# Patient Record
Sex: Female | Born: 1940 | Race: White | Hispanic: No | Marital: Married | Smoking: Current every day smoker
Health system: Southern US, Community
[De-identification: ages and names within clinical notes are randomized; demographics above are authoritative.]

## PROBLEM LIST (undated history)

## (undated) DIAGNOSIS — D049 Carcinoma in situ of skin, unspecified: Secondary | ICD-10-CM

## (undated) DIAGNOSIS — N281 Cyst of kidney, acquired: Secondary | ICD-10-CM

## (undated) DIAGNOSIS — T8859XA Other complications of anesthesia, initial encounter: Secondary | ICD-10-CM

## (undated) DIAGNOSIS — F0280 Dementia in other diseases classified elsewhere without behavioral disturbance: Secondary | ICD-10-CM

## (undated) DIAGNOSIS — I10 Essential (primary) hypertension: Secondary | ICD-10-CM

## (undated) DIAGNOSIS — I219 Acute myocardial infarction, unspecified: Secondary | ICD-10-CM

## (undated) DIAGNOSIS — I2510 Atherosclerotic heart disease of native coronary artery without angina pectoris: Secondary | ICD-10-CM

## (undated) DIAGNOSIS — N6009 Solitary cyst of unspecified breast: Secondary | ICD-10-CM

## (undated) DIAGNOSIS — I639 Cerebral infarction, unspecified: Secondary | ICD-10-CM

## (undated) DIAGNOSIS — M1990 Unspecified osteoarthritis, unspecified site: Secondary | ICD-10-CM

## (undated) DIAGNOSIS — I82409 Acute embolism and thrombosis of unspecified deep veins of unspecified lower extremity: Secondary | ICD-10-CM

## (undated) DIAGNOSIS — N390 Urinary tract infection, site not specified: Secondary | ICD-10-CM

## (undated) DIAGNOSIS — J45909 Unspecified asthma, uncomplicated: Secondary | ICD-10-CM

## (undated) DIAGNOSIS — K5792 Diverticulitis of intestine, part unspecified, without perforation or abscess without bleeding: Secondary | ICD-10-CM

## (undated) DIAGNOSIS — T4145XA Adverse effect of unspecified anesthetic, initial encounter: Secondary | ICD-10-CM

## (undated) DIAGNOSIS — G309 Alzheimer's disease, unspecified: Secondary | ICD-10-CM

## (undated) HISTORY — PX: TUBAL LIGATION: SHX77

## (undated) HISTORY — PX: EYE SURGERY: SHX253

## (undated) HISTORY — PX: BILATERAL KNEE ARTHROSCOPY: SUR91

---

## 1997-12-09 ENCOUNTER — Emergency Department (HOSPITAL_COMMUNITY): Admission: EM | Admit: 1997-12-09 | Discharge: 1997-12-09 | Payer: Self-pay | Admitting: *Deleted

## 1997-12-10 ENCOUNTER — Ambulatory Visit (HOSPITAL_COMMUNITY): Admission: RE | Admit: 1997-12-10 | Discharge: 1997-12-10 | Payer: Self-pay | Admitting: *Deleted

## 1998-01-03 ENCOUNTER — Emergency Department (HOSPITAL_COMMUNITY): Admission: EM | Admit: 1998-01-03 | Discharge: 1998-01-03 | Payer: Self-pay | Admitting: Emergency Medicine

## 1998-01-07 ENCOUNTER — Encounter: Admission: RE | Admit: 1998-01-07 | Discharge: 1998-04-07 | Payer: Self-pay | Admitting: *Deleted

## 1999-10-07 ENCOUNTER — Ambulatory Visit (HOSPITAL_COMMUNITY): Admission: RE | Admit: 1999-10-07 | Discharge: 1999-10-07 | Payer: Self-pay | Admitting: Internal Medicine

## 1999-10-07 ENCOUNTER — Encounter: Payer: Self-pay | Admitting: Internal Medicine

## 1999-11-04 ENCOUNTER — Emergency Department (HOSPITAL_COMMUNITY): Admission: EM | Admit: 1999-11-04 | Discharge: 1999-11-04 | Payer: Self-pay | Admitting: Emergency Medicine

## 1999-11-04 ENCOUNTER — Encounter: Payer: Self-pay | Admitting: Emergency Medicine

## 1999-12-26 ENCOUNTER — Emergency Department (HOSPITAL_COMMUNITY): Admission: EM | Admit: 1999-12-26 | Discharge: 1999-12-26 | Payer: Self-pay | Admitting: Emergency Medicine

## 2000-06-09 ENCOUNTER — Encounter: Payer: Self-pay | Admitting: Internal Medicine

## 2000-06-09 ENCOUNTER — Inpatient Hospital Stay (HOSPITAL_COMMUNITY): Admission: EM | Admit: 2000-06-09 | Discharge: 2000-06-10 | Payer: Self-pay

## 2002-03-07 ENCOUNTER — Encounter: Admission: RE | Admit: 2002-03-07 | Discharge: 2002-03-13 | Payer: Self-pay | Admitting: Occupational Medicine

## 2006-04-30 ENCOUNTER — Emergency Department (HOSPITAL_COMMUNITY): Admission: EM | Admit: 2006-04-30 | Discharge: 2006-05-01 | Payer: Self-pay | Admitting: Emergency Medicine

## 2007-03-03 ENCOUNTER — Emergency Department (HOSPITAL_COMMUNITY): Admission: EM | Admit: 2007-03-03 | Discharge: 2007-03-03 | Payer: Self-pay | Admitting: Emergency Medicine

## 2007-09-07 ENCOUNTER — Emergency Department (HOSPITAL_COMMUNITY): Admission: EM | Admit: 2007-09-07 | Discharge: 2007-09-07 | Payer: Self-pay | Admitting: Emergency Medicine

## 2007-11-10 ENCOUNTER — Emergency Department (HOSPITAL_COMMUNITY): Admission: EM | Admit: 2007-11-10 | Discharge: 2007-11-10 | Payer: Self-pay | Admitting: Emergency Medicine

## 2008-06-04 ENCOUNTER — Emergency Department (HOSPITAL_COMMUNITY): Admission: EM | Admit: 2008-06-04 | Discharge: 2008-06-04 | Payer: Self-pay | Admitting: Emergency Medicine

## 2009-03-12 ENCOUNTER — Emergency Department (HOSPITAL_COMMUNITY): Admission: EM | Admit: 2009-03-12 | Discharge: 2009-03-12 | Payer: Self-pay | Admitting: Emergency Medicine

## 2010-09-10 LAB — URINALYSIS, ROUTINE W REFLEX MICROSCOPIC
Ketones, ur: NEGATIVE mg/dL
Nitrite: NEGATIVE
Protein, ur: NEGATIVE mg/dL

## 2010-10-23 NOTE — Cardiovascular Report (Signed)
[place]. [facility]  Patient:    Mackenzie Richards, Mackenzie Richards                      MRN: [id number] Adm. Date:  [id number] Attending:  Pearla Dubonnet CC:         Pearla Dubonnet, M.D.  Cardiac catheterization laboratory   Cardiac Catheterization  PROCEDURE: 1. Left heart catheterization. 2. Coronary angiography. 3. Left ventriculogram. 4. Percutaneous closure of right femoral artery (Perclose).  INDICATIONS:  Mackenzie Richards is a 70 year old woman who has hyperlipidemia and is a tobacco abuser. She presented to Dr. Pearla Dubonnet office with prolonged anterior substernal chest discomfort, right, radiating through to the back, associated with some nausea and diaphoresis. The discomfort was relieved with a single sublingual nitroglycerin in the office. An ECG showed 0.5 to 1 mm ______ ST depression inferiorly. She is brought to the catheterization laboratory at this time to identify the extent of disease and provide for further therapeutic options.  PROCEDURAL NOTE:  Left heart catheterization was performed following the percutaneous insertion of 6-French catheter sheath utilizing an anterior approach over guiding J-wire into the right femoral artery. A 110-cm pigtail catheter was used to measure pressures in the ascending aorta and in the left ventricle, as well perform left ventriculography in a 30 degree RAO angulation. A 6-French #4 left and right Judkins catheters were used to perform coronary angiography. Cineangiography was performed in multiple LAO and RAO projections. A 0.1 mg intracoronary dose of nitroglycerin was given in the right coronary. This was followed by repeat angiography. At the completion, the catheter and catheter sheath were removed. Hemostasis was achieved by use of the Perclose system. There was good hemostasis. The patient was transported to the recovery area in stable condition with an intact  distal pulse.  HEMODYNAMICS: 1. Systemic arterial pressure was 124/63 with a mean of 87 mmHg. There was no    systolic gradient across the aortic valve. 2. Left ventricular end-diastolic pressure was 12 mmHg pre and post    ventriculogram.  ANGIOGRAPHY:  The left ventriculogram demonstrated normal left ventricular size and systolic function. There were no regional wall motion abnormalities. There was no calcification of the coronary arteries or the mitral or aortic valve noted. The calculated ejection fraction utilizing a single plane cine method was 61%. There was no mitral regurgitation.  There was a right dominant coronary system present. The main left coronary artery was normal.  The left anterior descending artery and its branches were normal, although diffusely narrowed.  The left circumflex artery and its branches were normal, although again diffusely narrowed.  The right coronary artery and its branches showed luminal irregularities, and there was a fusiform, concentric, 10 mm in length, 30 to 40% stenosis in the proximal segment. After administration of IC nitroglycerin, there was resolution of this narrowing, and the entire artery enlarged by at least 1 mm. There was a residual 30 to 40% stenosis at the junction of the mid and distal right coronary. The vessel did give rise to a small posterior descending artery, as well as moderate to small posterolateral segment and branches.  Collateral vessels were not seen.  FINAL IMPRESSION: 1. Nonobstructive atherosclerotic coronary vascular disease. 2. Suspect coronary vasospasm. 3. Intact left ventricular size and systolic function.  PLAN:  Will continue the patients current regimen of Lipitor and aspirin. Will add topical nitroglycerin 0.3 mg per hour, on in the morning, off at night. Advise strongly to discontinue  smoking as this is undoubtedly directly related to her clinical and angiographic findings. DD:   06/10/00 TD:  06/10/00 Job: [id number]

## 2010-10-23 NOTE — Discharge Summary (Signed)
[place]. [facility]  Patient:    Mackenzie Richards, Mackenzie Richards                      MRN: [id number] Adm. Date:  [id number] Disc. Date: [id number] Attending:  Pearla Dubonnet CC:         Mackenzie Richards, M.D.   Discharge Summary  DISCHARGE DIAGNOSES: 1. 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 2. Tobacco abuse. 3. Hypercholesterolemia. 4. Asthma. 5. Estrogen replacement therapy, but patient stopped three months prior to    admission. 6. Osteopenia. 7. History of deep venous thrombosis x 2 with negative coagulation work-up in    1998 including negative [facility] hypercoagulation panel and negative    screen for factor 5 ______ deficiency.  DISCHARGE MEDICATIONS: 1. Lipitor 40 mg q.d. 2. Aspirin 325 mg b.i.d. 3. Niacin 1 g p.o. q.h.s. 4. Nitroglycerin patch 0.3 mg topically for 18 hours a day with a six hour    window.  CONSULTS:  Cardiology-Dr. Corliss Marcus.  PROCEDURE:  Cardiac catheterization performed June 10, 2000.  Patient had a normal ejection fraction calculated at 61%.  She had a right dominant coronary system.  Left main coronary artery was normal.  Left anterior descending artery and its branches were normal, although diffusely narrowed.  The left circumflex artery and its branches were normal, although again diffusely narrowed.  The right coronary artery branches showed luminal irregularity.  There was a fusiform concentric 10 mm in length, 30-40% stenosis in the proximal segment. After administration of intracoronary nitroglycerin there was resolution of the narrowing and entire artery enlarged about by at least 1 mm.  There was a residual 30-40% stenosis in the junction of the mid and distal right coronary artery.  The vessel did give rise to a small posterior descending artery as well as a moderate to small posterolateral segment and branches.  Collateral vessels were not  seen.  HOSPITAL COURSE:  [name] is a very pleasant 70 year old female with the above medical problems who was admitted after complaining of several days of palpitations and on the morning of admission developed substernal chest pain and pressure at approximately 5 a.m.  This awakened her from her sleep.  She also felt short of breath and nauseous and the pain radiated to her back.  She presented to [facility] at [facility].  EKG revealed ST segment depression 1 mm in leads 2, 3, and aVF.  She was admitted to [facility] where coronary artery myocardial injury was ruled out with normal CPKs, CK-MBs, and troponin levels.  She was treated with intravenous nitroglycerin and IV heparin.  The patient then underwent cardiac catheterization as above.  Recommendations for therapy were to start nitroglycerin patch and to stop smoking.  Patient was also discharged home on Xanax 0.25 mg up to t.i.d. to treat any anxiety from nicotine withdrawal.  Plan to see her back in the office in one to two weeks.  She was discharged on June 10, 2000 with no chest pain and feeling well. DD:  06/13/00 TD:  06/13/00 Job: [id number]

## 2010-10-23 NOTE — Consult Note (Signed)
[place]. [facility]  Patient:    Mackenzie Richards, Mackenzie Richards                     MRN: [id number] Proc. Date: 06/09/00 Attending:  Francisca December, M.D. CC:         Pearla Dubonnet, M.D.   Consultation Report  REASON FOR CONSULTATION:  Chest pain and palpitations.  IMPRESSIONS: 1. New onset angina (unstable). 2. Hyperlipidemia. 3. Tobacco abuse. 4. Reversible obstructive airways disease. 5. Osteopenia. 6. Remote history of deep venous thrombosis following knee surgery.  RECOMMENDATIONS: 1. Agree with your planned thus far including intravenous heparin, IV    nitroglycerin, and p.o. aspirin. 2. Agree with rule out of myocardial infarction by serial CK-MB and    troponin enzymes, and repeat ECG. 3. Plan for diagnostic cardiac catheterization and possible percutaneous    coronary intervention a.m. of June 10, 2000.  Goals and risks were    reviewed with the patient.  She states her understanding, wishes to    proceed.  FINDINGS:  Mackenzie Richards is a 70 year old woman who, three days prior to admission, experienced the onset of intermittent palpitation.  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  They were not associated with significant shortness of breath or chest discomfort at this time.  She rested for the following two days and continued to have intermittent palpitation.  This morning at around 5 a.m., she awoke with substernal chest pressure, tightness, and pain radiating to the mid scapular region.  This was associated with some nausea and some shortness of breath.  She had no radiation to the arms.  She also had an increase in the frequency of her palpitations in a trigeminal or quadrigeminal fashion.  She sought care with Dr. Kevan Ny in the office who obtained an ECG revealing 1 mm of ST segment depression in the inferior leads. He administered sublingual nitroglycerin, and the  chest discomfort resolved. She was transferred to the emergency room, has remained pain free currently on IV nitroglycerin.  She denies any previous cardiac history, did not have hypertension, but is a tobacco abuser and has elevated cholesterol on Lipitor.  She has no strong family history of early CAD.  She is not diabetic.  PAST MEDICAL HISTORY:  As above.  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  She smokes about 1 pack of cigarettes per day, does not drink any alcohol.  Caffeine intake is not excessive.  FAMILY HISTORY:  Positive for frequent DVT and PE, especially in her mother; hypertension; late coronary disease; and Alzheimers disease.  MEDICATIONS: 1. Lipitor 40 mg p.o. q.d. 2. Aspirin 325 mg p.o. q.d. 3. Niacin 1 g per day.  DRUG ALLERGIES:  BIAXIN causes tachycardia.  PENICILLIN causes a rash.  SULFA causes nausea.  TEQUIN causes nausea and vomiting.  REVIEW OF SYSTEMS:  Negative except as mentioned above.  PHYSICAL EXAMINATION:  VITAL SIGNS:  Blood pressure 124/76, pulse 84, respirations 16, temperature afebrile.  GENERAL:  A well-appearing, 70 year old woman in no acute distress.  HEENT:  Remarkable for atraumatic, normocephalic cranium.  The pupils are equal, round and reactive to light and accommodation.  Extraocular movements are intact.  Oral mucosa is pink and moist, teeth and gums in good repair.  NECK:  Supple without thyromegaly or masses.  Carotid upstrokes are normal. There is no  bruit; there is no jugular venous distension.  CHEST:  Clear with good excursion bilaterally.  Normal vesicular breath sounds are heard throughout.  The precordium is quiet.  Normal S1 and S2.  No S3, S4, murmur, click, or rub noted.  PMI is not palpable.  ABDOMEN:  Soft, flat, nontender.  No hepatosplenomegaly or midline pulsatile masses.  EXTREMITIES:  Lower extremities no edema.  Distal pulses are  intact.  NEUROLOGIC:  Cranial nerves II-XII intact.  Motor and sensory grossly intact. Gait not tested.  SKIN:  Warm, dry, and clear.  LABORATORY DATA:  ECG in emergency room: Normal sinus rhythm, T wave inversions V2, V3.  No ST segment shift.  In the office at 1353, there was 1.5 to 1 mm ST segment depression in II, III, and aVF.  T waves in V2 and V3 were upright.  COMMENTS:  Mrs. Colaizzi presents with relatively classic unstable angina pectoris, and her symptoms, although prolonged, were relieved with a single nitroglycerin.  It is not out of the question that a non-ST segment elevation MI has occurred.  This will be further evaluated with serial enzymes. Ultimately, will require cardiac catheterization for diagnosis and likely therapy.  Thank you very much for allowing me to assist in the care of [facility]. It has been a pleasure to do so.  I will discuss her further care with you. DD:  06/09/00 TD:  06/09/00 Job: [id number]

## 2010-10-23 NOTE — H&P (Signed)
[place]. [facility]  Patient:    Mackenzie Richards, Mackenzie Richards                      MRN: [id number] Adm. Date:  [id number] Attending:  Pearla Dubonnet CC:         Francisca December, M.D.   History and Physical  CHIEF COMPLAINT: Chest pressure/pain.  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. [employer], with a history of the following:  1. Tobacco abuse.  2. Hypercholesterolemia.  3. Asthma.  4. Hormone replacement therapy, stopped three months ago.  5. Osteopenia.  6. History of deep vein thrombosis x 2, with negative coagulation work-up     in 1998.  CURRENT MEDICATIONS:  1. Lipitor 40 mg q.d.  2. Aspirin 325 mg b.i.d.  3. Niacin 1 g p.o. q.h.s.  She presents with complaints of several days of palpitations and on the morning of admission developed substernal chest pain and pressure at approximately 5 a.m., which awakened her from sleep.  She felt short of breath and nauseous, and the pain radiated to her mid back.  She presented to our office today at [facility] at [facility] with substernal pressure only, and a sublingual nitroglycerin relieved the pressure in the office.  Her EKG revealed sinus rhythm with ST segment depression 1 mm in 2, 3, and aVF. She is admitted now for probable unstable angina and question of subendocardial myocardial infarction.  ALLERGIES:  1. BIAXIN causes tachycardia.  2. PENICILLIN causes rash.  3. SULFA causes nausea.  4. TEQUIN has caused nausea and vomiting.  FAMILY HISTORY: Significant for deep vein thrombosis in her mother, with pulmonary embolism.  There is also a history of hypertension, coronary artery disease, and Alzheimers disease.  SOCIAL HISTORY: The patient has a 30-40 pack year smoking history and she currently smokes.  She does not drink alcohol.  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  She is married, with a  supportive husband.  REVIEW OF SYSTEMS: As per HPI.  No fever or chills.  No vomiting, no diarrhea, no abdominal pain.  No dysuria.  PHYSICAL EXAMINATION:  GENERAL: The patient is a well-developed, well-nourished female complaining of mild chest pressure.  This was relieved with sublingual nitroglycerin.  VITAL SIGNS: Blood pressure 124/70, pulse 80 and regular - no evidence of ectopy.  Respiratory rate 16 and easy.  Temperature 98.2 degrees.  Pulse oximetry 96% on room air.  HEENT: Head normocephalic, atraumatic.  Normal examination.  NECK: Without JVD.  No bruits noted bilaterally.  CHEST: Clear to auscultation.  CARDIAC: Regular rate and rhythm without murmurs, rubs, or gallops.  ABDOMEN: Soft, nontender.  EXTREMITIES: Without edema.  NEUROLOGIC: The patient is oriented x 3.  Nonfocal examination.  LABORATORY DATA: EKG reveals sinus rhythm at 80 beats per minute and ST segment depression in 2, 3, and aVF; no Q waves are present; no ectopy; no PVCs or PACs.  ASSESSMENT: The patient is a 70 year old female who is an active smoker and has hypercholesterolemia, with a family history of coronary artery disease as well as pulmonary embolus and deep vein thrombosis.  She presents with symptoms consistent with unstable angina.  PLAN:  1. Cardiac service consultation - discuss with Dr. Corliss Marcus.  2. Treat with IV heparin and IV nitroglycerin.  3. Continue aspirin therapy.  4. Nasal cannula oxygen.  5. Continue Lipitor.  6. Check serial cardiac enzymes.  7. Place on cardiac monitoring.  The patient is agreeable to admission and understands her plan of care. DD:  06/10/00 TD:  06/10/00 Job: [id number]

## 2011-03-02 LAB — POCT I-STAT, CHEM 8
Calcium, Ion: 1.16
Creatinine, Ser: 1.1
Hemoglobin: 11.9 — ABNORMAL LOW
Sodium: 143
TCO2: 23

## 2011-03-02 LAB — POCT CARDIAC MARKERS
Myoglobin, poc: 33.5
Myoglobin, poc: 39.3
Operator id: 146091
Operator id: 288331
Troponin i, poc: <0.05

## 2011-03-02 LAB — D-DIMER, QUANTITATIVE: D-Dimer, Quant: 0.46

## 2011-03-04 LAB — POCT I-STAT, CHEM 8
BUN: 14
Creatinine, Ser: 1.3 — ABNORMAL HIGH
Glucose, Bld: 106 — ABNORMAL HIGH
Hemoglobin: 13.9
Potassium: 3.7

## 2011-03-12 LAB — URINE MICROSCOPIC-ADD ON

## 2011-03-12 LAB — DIFFERENTIAL
Basophils Absolute: 0.1 10*3/uL (ref 0.0–0.1)
Basophils Relative: 1 % (ref 0–1)
Eosinophils Absolute: 0.2 10*3/uL (ref 0.0–0.7)
Monocytes Relative: 7 % (ref 3–12)
Neutro Abs: 4.4 10*3/uL (ref 1.7–7.7)
Neutrophils Relative %: 57 % (ref 43–77)

## 2011-03-12 LAB — COMPREHENSIVE METABOLIC PANEL
ALT: 25 U/L (ref 0–35)
Alkaline Phosphatase: 83 U/L (ref 39–117)
BUN: 7 mg/dL (ref 6–23)
CO2: 26 mEq/L (ref 19–32)
Chloride: 105 mEq/L (ref 96–112)
GFR calc non Af Amer: 60 mL/min — ABNORMAL LOW (ref >60)
Glucose, Bld: 99 mg/dL (ref 70–99)
Potassium: 3.9 mEq/L (ref 3.5–5.1)
Sodium: 141 mEq/L (ref 135–145)
Total Bilirubin: 1.2 mg/dL (ref 0.3–1.2)
Total Protein: 7.1 g/dL (ref 6.0–8.3)

## 2011-03-12 LAB — URINALYSIS, ROUTINE W REFLEX MICROSCOPIC
Bilirubin Urine: NEGATIVE
Glucose, UA: NEGATIVE mg/dL
Specific Gravity, Urine: 1.005 (ref 1.005–1.030)
Urobilinogen, UA: 0.2 mg/dL (ref 0.0–1.0)

## 2011-03-12 LAB — CBC
HCT: 41 % (ref 36.0–46.0)
Hemoglobin: 13.7 g/dL (ref 12.0–15.0)
RBC: 3.91 MIL/uL (ref 3.87–5.11)
RDW: 13.9 % (ref 11.5–15.5)
WBC: 7.8 10*3/uL (ref 4.0–10.5)

## 2012-04-04 ENCOUNTER — Encounter (HOSPITAL_COMMUNITY): Payer: Self-pay | Admitting: Emergency Medicine

## 2012-04-04 ENCOUNTER — Emergency Department (INDEPENDENT_AMBULATORY_CARE_PROVIDER_SITE_OTHER)
Admission: EM | Admit: 2012-04-04 | Discharge: 2012-04-04 | Disposition: A | Discharge status: Home / Self Care | Payer: Medicare Other | Source: Home / Self Care | Attending: Emergency Medicine | Admitting: Emergency Medicine

## 2012-04-04 DIAGNOSIS — N390 Urinary tract infection, site not specified: Secondary | ICD-10-CM

## 2012-04-04 HISTORY — DX: Urinary tract infection, site not specified: N39.0

## 2012-04-04 HISTORY — DX: Diverticulitis of intestine, part unspecified, without perforation or abscess without bleeding: K57.92

## 2012-04-04 HISTORY — DX: Solitary cyst of unspecified breast: N60.09

## 2012-04-04 HISTORY — DX: Unspecified asthma, uncomplicated: J45.909

## 2012-04-04 HISTORY — DX: Acute embolism and thrombosis of unspecified deep veins of unspecified lower extremity: I82.409

## 2012-04-04 HISTORY — DX: Carcinoma in situ of skin, unspecified: D04.9

## 2012-04-04 HISTORY — DX: Cyst of kidney, acquired: N28.1

## 2012-04-04 LAB — POCT URINALYSIS DIP (DEVICE)
Bilirubin Urine: NEGATIVE
Glucose, UA: 100 mg/dL — AB
Nitrite: POSITIVE — AB
Urobilinogen, UA: 1 mg/dL (ref 0.0–1.0)
pH: 6.5 (ref 5.0–8.0)

## 2012-04-04 MED ORDER — ONDANSETRON HCL 4 MG PO TABS: 4.0000 mg | ORAL_TABLET | Freq: Three times a day (TID) | ORAL | Status: DC | PRN

## 2012-04-04 MED ORDER — NITROFURANTOIN MONOHYD MACRO 100 MG PO CAPS: 100.0000 mg | ORAL_CAPSULE | Freq: Two times a day (BID) | ORAL | Status: DC

## 2012-04-04 NOTE — ED Provider Notes (Signed)
History     CSN: [id number]  Arrival date & time 04/04/12  1447   First MD Initiated Contact with Patient 04/04/12 1450      Chief Complaint  Patient presents with  . Urinary Tract Infection    (Consider location/radiation/quality/duration/timing/severity/associated sxs/prior treatment) HPI Comments: This is a 71 year old well-preserved female with a long history of frequent UTIs presents with UTI symptoms. She describes burning and stinging with urination, dysuria and pelvic pressure. She says she has pain that radiates from the pelvis to her bilateral flanks for 48 hours. This morning she awoke and she's vomited 3 times. For treatment she is taking increased amounts of water taking AZO and drinking cranberry juice.she also took 1 capsule of an old tetracycline. She denies fever chills shortness of breath chest pain or upper respiratory infection symptoms. Her most recent UTI was a string of Escherichia coli resistant to me, and antibiotics including Cipro. She is taking Cipro at that time and was not improving to have to be admitted to the hospital. The urine culture at that time showed that nitrofurantoin was sensitive to this particular strain.   Past Medical History  Diagnosis Date  . Asthma   . Frequent UTI   . Kidney cysts   . Breast cyst   . Bowen's disease   . DVT (deep venous thrombosis)   . Diverticulitis     Past Surgical History  Procedure Date  . Bilateral knee arthroscopy   . Eye surgery   . Tubal ligation     No family history on file.  History  Substance Use Topics  . Smoking status: Current Every Day Smoker -- 1.0 packs/day    Types: Cigarettes  . Smokeless tobacco: Not on file  . Alcohol Use: No    OB History    Grav Para Term Preterm Abortions TAB SAB Ect Mult Living                  Review of Systems  Constitutional: Negative for fever, chills and activity change.  HENT: Negative.   Respiratory: Negative.   Cardiovascular: Negative.     Genitourinary: Positive for dysuria, urgency, frequency, flank pain, difficulty urinating and pelvic pain. Negative for vaginal discharge and vaginal pain.  Skin: Negative for color change, pallor and rash.  Neurological: Negative.     Allergies  Biaxin; Levaquin; Penicillins; Percocet; Sulfa antibiotics; and Zyban  Home Medications   Current Outpatient Rx  Name Route Sig Dispense Refill  . ASPIRIN 325 MG PO TABS Oral Take 325 mg by mouth daily.    . ENALAPRIL MALEATE PO Oral Take by mouth.    Marland Kitchen SIMVASTATIN 80 MG PO TABS Oral Take 80 mg by mouth at bedtime.    . TETRACYCLINE HCL PO Oral Take by mouth.    . ZOLPIDEM TARTRATE 5 MG PO TABS Oral Take 5 mg by mouth at bedtime as needed.    Marland Kitchen NITROFURANTOIN MONOHYD MACRO 100 MG PO CAPS Oral Take 1 capsule (100 mg total) by mouth 2 (two) times daily. 20 capsule 0  . ONDANSETRON HCL 4 MG PO TABS Oral Take 1 tablet (4 mg total) by mouth every 8 (eight) hours as needed for nausea. May cause constipation 20 tablet 0    BP 167/79  Pulse 70  Temp 98.9 F (37.2 C) (Oral)  Resp 18  SpO2 95%  Physical Exam  Constitutional: She is oriented to person, place, and time. She appears well-developed and well-nourished. No distress.  HENT:  Head:  Normocephalic and atraumatic.  Eyes: EOM are normal.  Neck: Normal range of motion. Neck supple.  Cardiovascular: Normal rate and normal heart sounds.   Pulmonary/Chest: Effort normal and breath sounds normal. No respiratory distress.  Abdominal: Soft. She exhibits no mass. There is tenderness. There is no rebound and no guarding.  Musculoskeletal: Normal range of motion. She exhibits no edema.  Neurological: She is alert and oriented to person, place, and time. No cranial nerve deficit.  Skin: Skin is warm and dry. No rash noted.  Psychiatric: She has a normal mood and affect.    ED Course  Procedures (including critical care time)  Labs Reviewed  POCT URINALYSIS DIP (DEVICE) - Abnormal; Notable  for the following:    Glucose, UA 100 (*)     Hgb urine dipstick MODERATE (*)     Nitrite POSITIVE (*)     Leukocytes, UA SMALL (*)  Biochemical Testing Only. Please order routine urinalysis from main lab if confirmatory testing is needed.   All other components within normal limits  URINE CULTURE   No results found.   1. UTI (urinary tract infection), uncomplicated       MDM  Since her past cultures have produced results that showed nitrofurantoin sensitivity will prescribe Macrobid twice a day for 10 days. Nausea Zofran 4 mg every 6-8 hours when necessary Plenty of fluids in small frequent amounts and continued cranberry juice. Suggest you call [facility] urology that she would have established with and continue the workup. For development of fever increased back pain vomiting or inability to maintain the antibiotics. May need to go to the emergency department.        Hayden Rasmussen, NP 04/04/12 1538  Hayden Rasmussen, NP 04/04/12 1538

## 2012-04-04 NOTE — ED Notes (Signed)
Pt c/o UTI x3 days... Hx of frequent UTI's.... Sx include: burning/stinging when she urinates, pelvic pressure and back pain, nauseas, vomiting.... Denies: fevers, diarrhea... Pt is alert w/no signs of distress.

## 2012-04-05 LAB — URINE CULTURE

## 2012-04-06 NOTE — ED Provider Notes (Signed)
Medical screening examination/treatment/procedure(s) were performed by non-physician practitioner and as supervising physician I was immediately available for consultation/collaboration.  Leslee Home, M.D.   Reuben Likes, MD 04/06/12 [phone]

## 2012-08-11 ENCOUNTER — Encounter (HOSPITAL_COMMUNITY): Payer: Self-pay

## 2012-08-11 ENCOUNTER — Emergency Department (HOSPITAL_COMMUNITY)
Admission: EM | Admit: 2012-08-11 | Discharge: 2012-08-11 | Disposition: A | Discharge status: Home / Self Care | Payer: Medicare Other | Source: Home / Self Care

## 2012-08-11 ENCOUNTER — Emergency Department (INDEPENDENT_AMBULATORY_CARE_PROVIDER_SITE_OTHER): Payer: Medicare Other

## 2012-08-11 DIAGNOSIS — S92514A Nondisplaced fracture of proximal phalanx of right lesser toe(s), initial encounter for closed fracture: Secondary | ICD-10-CM

## 2012-08-11 DIAGNOSIS — S92919A Unspecified fracture of unspecified toe(s), initial encounter for closed fracture: Secondary | ICD-10-CM

## 2012-08-11 DIAGNOSIS — N390 Urinary tract infection, site not specified: Secondary | ICD-10-CM

## 2012-08-11 MED ORDER — HYDROCODONE-ACETAMINOPHEN 5-325 MG PO TABS: 1.0000 | ORAL_TABLET | Freq: Four times a day (QID) | ORAL | Status: DC | PRN

## 2012-08-11 NOTE — ED Provider Notes (Signed)
Medical screening examination/treatment/procedure(s) were performed by non-physician practitioner and as supervising physician I was immediately available for consultation/collaboration.  Leslee Home, M.D.  Reuben Likes, MD 08/11/12 2015

## 2012-08-11 NOTE — ED Notes (Addendum)
Fell on steps at her home going into her basment ; c/o pain in right hip and right foot/ankle; denies other injury , denies LOC

## 2012-08-11 NOTE — ED Provider Notes (Signed)
History     CSN: [id number]  Arrival date & time 08/11/12  1734   None     Chief Complaint  Patient presents with  . Fall    (Consider location/radiation/quality/duration/timing/severity/associated sxs/prior treatment) HPI Comments: Power out in pt's home, she attempted to walk downstairs in the dark, R foot rolled on 2nd to bottom step and she fell forward landing on R side. Pt reports R shoulder and wrist and hip are "sore" but not a concern to her, but R foot and lower leg are in "pain" and she is worried they are broken.   Patient is a 72 y.o. female presenting with fall. The history is provided by the patient.  Fall The accident occurred 3 to 5 hours ago. Fall occurred: walking down stairs. She fell from a height of 1 to 2 ft. She landed on a hard floor. There was no blood loss. The point of impact was the right hip (R hip, R foot and lower leg). Pain location: R foot and lower leg. The pain is at a severity of 6/10. The pain is moderate. She was ambulatory at the scene. Pertinent negatives include no numbness, no nausea, no headaches, no loss of consciousness and no tingling. The symptoms are aggravated by activity and use of the injured limb. She has tried ice for the symptoms. The treatment provided no relief.    Past Medical History  Diagnosis Date  . Asthma   . Frequent UTI   . Kidney cysts   . Breast cyst   . Bowen's disease   . DVT (deep venous thrombosis)   . Diverticulitis     Past Surgical History  Procedure Laterality Date  . Bilateral knee arthroscopy    . Eye surgery    . Tubal ligation      History reviewed. No pertinent family history.  History  Substance Use Topics  . Smoking status: Current Every Day Smoker -- 1.00 packs/day    Types: Cigarettes  . Smokeless tobacco: Not on file  . Alcohol Use: No    OB History   Grav Para Term Preterm Abortions TAB SAB Ect Mult Living                  Review of Systems  Gastrointestinal: Negative for  nausea.  Musculoskeletal:       R foot and lower leg pain; R shoulder, hip and wrist sore  Skin:       Bruising R foot  Neurological: Negative for dizziness, tingling, loss of consciousness, weakness, numbness and headaches.    Allergies  Biaxin; Fentanyl; Hydrochlorothiazide; Lasix; Levaquin; Penicillins; Percocet; Sulfa antibiotics; and Zyban  Home Medications   Current Outpatient Rx  Name  Route  Sig  Dispense  Refill  . aspirin 325 MG tablet   Oral   Take 325 mg by mouth daily.         . ENALAPRIL MALEATE PO   Oral   Take by mouth.         . simvastatin (ZOCOR) 80 MG tablet   Oral   Take 80 mg by mouth at bedtime.         Marland Kitchen spironolactone (ALDACTONE) 25 MG tablet   Oral   Take 25 mg by mouth daily.         Marland Kitchen zolpidem (AMBIEN) 5 MG tablet   Oral   Take 5 mg by mouth at bedtime as needed.         Marland Kitchen HYDROcodone-acetaminophen (NORCO/VICODIN) 5-325  MG per tablet   Oral   Take 1 tablet by mouth every 6 (six) hours as needed for pain.   16 tablet   0   . nitrofurantoin, macrocrystal-monohydrate, (MACROBID) 100 MG capsule   Oral   Take 1 capsule (100 mg total) by mouth 2 (two) times daily.   20 capsule   0   . ondansetron (ZOFRAN) 4 MG tablet   Oral   Take 1 tablet (4 mg total) by mouth every 8 (eight) hours as needed for nausea. May cause constipation   20 tablet   0   . TETRACYCLINE HCL PO   Oral   Take by mouth.           BP 162/80  Pulse 59  Temp(Src) 97.7 F (36.5 C) (Oral)  Resp 16  SpO2 98%  Physical Exam  Constitutional: She appears well-developed and well-nourished. No distress.  Musculoskeletal:       Right shoulder: She exhibits tenderness. She exhibits normal range of motion, no bony tenderness, no swelling and no deformity.       Right wrist: Normal.       Right hip: She exhibits tenderness. She exhibits no bony tenderness, no swelling and no deformity.  Pt refuses full ROM of R hip stating "my hip is fine".   Skin: Skin is  warm, dry and intact. Bruising noted.  Bruising just proximal to R 2nd, 3rd and 4th toes.     ED Course  Procedures (including critical care time)  Labs Reviewed - No data to display Dg Tibia/fibula Right  08/11/2012  *RADIOLOGY REPORT*  Clinical Data: Fall, bruising at the third MTP joint and mid lower leg  RIGHT TIBIA AND FIBULA - 2 VIEW  Comparison: Concurrently obtained radiographs of the foot  Findings: No acute fracture, malalignment or focal soft tissue swelling.  Bony mineralization is within normal limits.  Mild degenerative changes incompletely imaged in the knee.  Visualized portions of the foot are unremarkable.  IMPRESSION: No acute osseous abnormality.   Original Report Authenticated By: Malachy Moan, M.D.    Dg Foot Complete Right  08/11/2012  *RADIOLOGY REPORT*  Clinical Data: Fall with bruising at the third metatarsal phalangeal joint.  RIGHT FOOT COMPLETE - 3+ VIEW  Comparison: None.  Findings: Transverse, nondisplaced fracture of the proximal aspect of the proximal phalanx of the third digit.  No intra-articular extension.  Not well visualized on the lateral view.  IMPRESSION: Third proximal phalanx fracture.   Original Report Authenticated By: Jeronimo Greaves, M.D.      1. Closed nondisp fx of proximal phalanx of lesser toe of right foot, initial encounter       MDM          Cathlyn Parsons, NP 08/11/12 1929

## 2013-11-04 ENCOUNTER — Encounter (HOSPITAL_COMMUNITY): Payer: Self-pay | Admitting: Emergency Medicine

## 2013-11-04 ENCOUNTER — Emergency Department (INDEPENDENT_AMBULATORY_CARE_PROVIDER_SITE_OTHER)
Admission: EM | Admit: 2013-11-04 | Discharge: 2013-11-04 | Disposition: A | Discharge status: Home / Self Care | Payer: Medicare Other | Source: Home / Self Care | Attending: Emergency Medicine | Admitting: Emergency Medicine

## 2013-11-04 DIAGNOSIS — J209 Acute bronchitis, unspecified: Secondary | ICD-10-CM

## 2013-11-04 MED ORDER — METHYLPREDNISOLONE ACETATE 80 MG/ML IJ SUSP
80.0000 mg | Freq: Once | INTRAMUSCULAR | Status: AC
  Administered 2013-11-04: 80 mg via INTRAMUSCULAR

## 2013-11-04 MED ORDER — PREDNISONE 20 MG PO TABS: ORAL_TABLET | ORAL | Status: DC

## 2013-11-04 MED ORDER — DOXYCYCLINE HYCLATE 100 MG PO TABS: 100.0000 mg | ORAL_TABLET | Freq: Two times a day (BID) | ORAL | Status: DC

## 2013-11-04 MED ORDER — METHYLPREDNISOLONE ACETATE 80 MG/ML IJ SUSP
INTRAMUSCULAR | Status: AC
  Filled 2013-11-04: qty 1

## 2013-11-04 MED ORDER — HYDROCOD POLST-CHLORPHEN POLST 10-8 MG/5ML PO LQCR
5.0000 mL | Freq: Two times a day (BID) | ORAL | Status: DC | PRN
Start: 2013-11-04 — End: 2016-11-17

## 2013-11-04 NOTE — ED Notes (Signed)
4 days of sore throat, nasal congestion and productive cough without fever  She has been using her nebulizer at home for wheezing

## 2013-11-04 NOTE — Discharge Instructions (Signed)
Acute Bronchitis Bronchitis is inflammation of the airways that extend from the windpipe into the lungs (bronchi). The inflammation often causes mucus to develop. This leads to a cough, which is the most common symptom of bronchitis.  In acute bronchitis, the condition usually develops suddenly and goes away over time, usually in a couple weeks. Smoking, allergies, and asthma can make bronchitis worse. Repeated episodes of bronchitis may cause further lung problems.  CAUSES Acute bronchitis is most often caused by the same virus that causes a cold. The virus can spread from person to person (contagious).  SIGNS AND SYMPTOMS   Cough.   Fever.   Coughing up mucus.   Body aches.   Chest congestion.   Chills.   Shortness of breath.   Sore throat.  DIAGNOSIS  Acute bronchitis is usually diagnosed through a physical exam. Tests, such as chest X-rays, are sometimes done to rule out other conditions.  TREATMENT  Acute bronchitis usually goes away in a couple weeks. Often times, no medical treatment is necessary. Medicines are sometimes given for relief of fever or cough. Antibiotics are usually not needed but may be prescribed in certain situations. In some cases, an inhaler may be recommended to help reduce shortness of breath and control the cough. A cool mist vaporizer may also be used to help thin bronchial secretions and make it easier to clear the chest.  HOME CARE INSTRUCTIONS  Get plenty of rest.   Drink enough fluids to keep your urine clear or pale yellow (unless you have a medical condition that requires fluid restriction). Increasing fluids may help thin your secretions and will prevent dehydration.   Only take over-the-counter or prescription medicines as directed by your health care provider.   Avoid smoking and secondhand smoke. Exposure to cigarette smoke or irritating chemicals will make bronchitis worse. If you are a smoker, consider using nicotine gum or skin  patches to help control withdrawal symptoms. Quitting smoking will help your lungs heal faster.   Reduce the chances of another bout of acute bronchitis by washing your hands frequently, avoiding people with cold symptoms, and trying not to touch your hands to your mouth, nose, or eyes.   Follow up with your health care provider as directed.  SEEK MEDICAL CARE IF: Your symptoms do not improve after 1 week of treatment.  SEEK IMMEDIATE MEDICAL CARE IF:  You develop an increased fever or chills.   You have chest pain.   You have severe shortness of breath.  You have bloody sputum.   You develop dehydration.  You develop fainting.  You develop repeated vomiting.  You develop a severe headache. MAKE SURE YOU:   Understand these instructions.  Will watch your condition.  Will get help right away if you are not doing well or get worse. Document Released: 07/01/2004 Document Revised: 01/24/2013 Document Reviewed: 11/14/2012 [employer] Patient Information 2014 [employer].  

## 2013-11-04 NOTE — ED Provider Notes (Signed)
  Chief Complaint   Chief Complaint  Patient presents with  . Cough    History of Present Illness   Mackenzie Richards is a 73 year old female smoker with a one-week history of sore throat, cough productive yellow sputum, chest tightness, wheezing, a raw feeling in her chest, nasal congestion with yellow drainage, headache, and sinus pressure. She denies fever, chills, or GI symptoms. She's been exposed to a family member who had a similar illness.  Review of Systems   Other than as noted above, the patient denies any of the following symptoms: Systemic:  No fevers, chills, sweats, or myalgias. Eye:  No redness or discharge. ENT:  No ear pain, headache, nasal congestion, drainage, sinus pressure, or sore throat. Neck:  No neck pain, stiffness, or swollen glands. Lungs:  No cough, sputum production, hemoptysis, wheezing, chest tightness, shortness of breath or chest pain. GI:  No abdominal pain, nausea, vomiting or diarrhea.  PMFSH   Past medical history, family history, social history, meds, and allergies were reviewed. She has coronary artery disease with 2 stents. She's a smoker and has elevated cholesterol. Current meds include aspirin, enalapril, Zocor, and Aldactone.  Physical exam   Vital signs:  BP 155/75  Pulse 71  Temp(Src) 98.1 F (36.7 C) (Oral)  Resp 18  SpO2 97% General:  Alert and oriented.  In no distress.  Skin warm and dry. Eye:  No conjunctival injection or drainage. Lids were normal. ENT:  TMs and canals were normal, without erythema or inflammation.  Nasal mucosa was clear and uncongested, without drainage.  Mucous membranes were moist.  Pharynx was clear with no exudate or drainage.  There were no oral ulcerations or lesions. Neck:  Supple, no adenopathy, tenderness or mass. Lungs:  No respiratory distress.  She has bilateral scattered rhonchi and wheezes, no rales, good air movement bilaterally.  Heart:  Regular rhythm, without gallops, murmers or rubs. Skin:   Clear, warm, and dry, without rash or lesions.   Assessment     The encounter diagnosis was Acute bronchitis.  Probably acute on chronic bronchitis. She is still smoking and was strongly encouraged to quit.  Plan    1.  Meds:  The following meds were prescribed:   Discharge Medication List as of 11/04/2013 12:15 PM    START taking these medications   Details  chlorpheniramine-HYDROcodone (TUSSIONEX) 10-8 MG/5ML LQCR Take 5 mLs by mouth every 12 (twelve) hours as needed for cough., Starting 11/04/2013, Until Discontinued, Normal    doxycycline (VIBRA-TABS) 100 MG tablet Take 1 tablet (100 mg total) by mouth 2 (two) times daily., Starting 11/04/2013, Until Discontinued, Normal    predniSONE (DELTASONE) 20 MG tablet Take 3 daily for 5 days, 2 daily for 5 days, 1 daily for 5 days., Normal        2.  Patient Education/Counseling:  The patient was given appropriate handouts, self care instructions, and instructed in symptomatic relief.  Instructed to get extra fluids, rest, and use a cool mist vaporizer.    3.  Follow up:  The patient was told to follow up here if no better in 3 to 4 days, or sooner if becoming worse in any way, and given some red flag symptoms such as increasing fever, difficulty breathing, chest pain, or persistent vomiting which would prompt immediate return.  Follow up here as needed.      Reuben Likes, MD 11/04/13 1324

## 2013-11-11 ENCOUNTER — Emergency Department (INDEPENDENT_AMBULATORY_CARE_PROVIDER_SITE_OTHER): Payer: Medicare Other

## 2013-11-11 ENCOUNTER — Emergency Department (INDEPENDENT_AMBULATORY_CARE_PROVIDER_SITE_OTHER)
Admission: EM | Admit: 2013-11-11 | Discharge: 2013-11-11 | Disposition: A | Discharge status: Home / Self Care | Payer: Medicare Other | Source: Home / Self Care | Attending: Family Medicine | Admitting: Family Medicine

## 2013-11-11 ENCOUNTER — Encounter (HOSPITAL_COMMUNITY): Payer: Self-pay | Admitting: Emergency Medicine

## 2013-11-11 DIAGNOSIS — R059 Cough, unspecified: Secondary | ICD-10-CM

## 2013-11-11 DIAGNOSIS — J209 Acute bronchitis, unspecified: Secondary | ICD-10-CM

## 2013-11-11 DIAGNOSIS — R05 Cough: Secondary | ICD-10-CM

## 2013-11-11 DIAGNOSIS — J189 Pneumonia, unspecified organism: Secondary | ICD-10-CM

## 2013-11-11 MED ORDER — METHYLPREDNISOLONE ACETATE 80 MG/ML IJ SUSP
INTRAMUSCULAR | Status: AC
  Filled 2013-11-11: qty 1

## 2013-11-11 MED ORDER — METHYLPREDNISOLONE ACETATE 80 MG/ML IJ SUSP
80.0000 mg | Freq: Once | INTRAMUSCULAR | Status: AC
  Administered 2013-11-11: 80 mg via INTRAMUSCULAR

## 2013-11-11 MED ORDER — CEFDINIR 300 MG PO CAPS: 300.0000 mg | ORAL_CAPSULE | Freq: Two times a day (BID) | ORAL | Status: AC

## 2013-11-11 NOTE — ED Provider Notes (Signed)
Medical screening examination/treatment/procedure(s) were performed by a resident physician or non-physician practitioner and as the supervising physician I was immediately available for consultation/collaboration.  Tayleigh Wetherell, MD    Illya Gienger S Kiira Brach, MD 11/11/13 1848 

## 2013-11-11 NOTE — ED Provider Notes (Signed)
CSN: [id number]     Arrival date & time 11/11/13  0901 History   First MD Initiated Contact with Patient 11/11/13 [phone]     Chief Complaint  Patient presents with  . Cough  . Re-evaluation   (Consider location/radiation/quality/duration/timing/severity/associated sxs/prior Treatment) HPI Comments: [name] presents today with a continued productive cough. She carries a history of long time smoker and COPD as well. She was evaluated on 05/31 and was diagnosed with bronchitis. Treated with pulse steroids and doxy. She was better for a few days, then the cough has now worsened with continued yellow productive sputum. She continues to have yellow drainage from left eye. No fever or chills. Mild malaise.   Patient is a 73 y.o. female presenting with cough. The history is provided by the patient.  Cough Associated symptoms: rhinorrhea and wheezing   Associated symptoms: no chills, no fever and no shortness of breath     Past Medical History  Diagnosis Date  . Asthma   . Frequent UTI   . Kidney cysts   . Breast cyst   . Bowen's disease   . DVT (deep venous thrombosis)   . Diverticulitis    Past Surgical History  Procedure Laterality Date  . Bilateral knee arthroscopy    . Eye surgery    . Tubal ligation     No family history on file. History  Substance Use Topics  . Smoking status: Current Every Day Smoker -- 1.00 packs/day    Types: Cigarettes  . Smokeless tobacco: Not on file  . Alcohol Use: No   OB History   Grav Para Term Preterm Abortions TAB SAB Ect Mult Living                 Review of Systems  Constitutional: Positive for fatigue. Negative for fever and chills.  HENT: Positive for postnasal drip, rhinorrhea and sinus pressure.   Respiratory: Positive for cough and wheezing. Negative for chest tightness and shortness of breath.   Endocrine: Negative.   Genitourinary: Negative.   Skin: Negative.   Neurological: Negative.   Psychiatric/Behavioral: Negative.      Allergies  Biaxin; Fentanyl; Hydrochlorothiazide; Lasix; Levaquin; Penicillins; Percocet; Sulfa antibiotics; and Zyban  Home Medications   Prior to Admission medications   Medication Sig Start Date End Date Taking? Authorizing Provider  aspirin 325 MG tablet Take 325 mg by mouth daily.   Yes Historical Provider, MD  chlorpheniramine-HYDROcodone (TUSSIONEX) 10-8 MG/5ML LQCR Take 5 mLs by mouth every 12 (twelve) hours as needed for cough. 11/04/13  Yes Reuben Likesavid C Keller, MD  doxycycline (VIBRA-TABS) 100 MG tablet Take 1 tablet (100 mg total) by mouth 2 (two) times daily. 11/04/13  Yes Reuben Likesavid C Keller, MD  ENALAPRIL MALEATE PO Take by mouth.   Yes Historical Provider, MD  predniSONE (DELTASONE) 20 MG tablet Take 3 daily for 5 days, 2 daily for 5 days, 1 daily for 5 days. 11/04/13  Yes Reuben Likesavid C Keller, MD  simvastatin (ZOCOR) 80 MG tablet Take 80 mg by mouth at bedtime.   Yes Historical Provider, MD  spironolactone (ALDACTONE) 25 MG tablet Take 25 mg by mouth daily.   Yes Historical Provider, MD  zolpidem (AMBIEN) 5 MG tablet Take 5 mg by mouth at bedtime as needed.   Yes Historical Provider, MD  cefdinir (OMNICEF) 300 MG capsule Take 1 capsule (300 mg total) by mouth 2 (two) times daily. 11/11/13 11/21/13  Riki SheerMichelle G Ashley Montminy, PA-C   BP 210/104  Pulse 67  Temp(Src)  98.2 F (36.8 C) (Oral)  Resp 18  SpO2 95% Physical Exam  Nursing note and vitals reviewed. Constitutional: She is oriented to person, place, and time. She appears well-developed and well-nourished. No distress.  HENT:  Head: Normocephalic and atraumatic.  Right Ear: External ear normal.  Left Ear: External ear normal.  Eyes: Pupils are equal, round, and reactive to light. Right eye exhibits no discharge. Left eye exhibits discharge.  Neck: Normal range of motion. No thyromegaly present.  Cardiovascular: Normal rate, regular rhythm and normal heart sounds.   No murmur heard. Pulmonary/Chest: Effort normal. No respiratory distress.  She has wheezes. She has no rales.  Fine crackles in the right base with expiratory wheeze  Neurological: She is alert and oriented to person, place, and time.  Skin: Skin is warm and dry. She is not diaphoretic. No erythema.  Psychiatric: Her behavior is normal.    ED Course  Procedures (including critical care time) Labs Review Labs Reviewed - No data to display  Imaging Review Dg Chest 2 View  11/11/2013   CLINICAL DATA:  Productive cough and fever since 10/29/2013.  EXAM: CHEST  2 VIEW  COMPARISON:  09/07/2007  FINDINGS: Lungs are adequately inflated with mild opacification over the right lower lobe likely due to infection and less likely atelectasis. Cardiomediastinal silhouette and remainder the exam is unchanged. Marland Kitchen  IMPRESSION: Right lower lobe airspace process likely due to infection.   Electronically Signed   By: Elberta Fortis M.D.   On: 11/11/2013 10:21     MDM   1. Community acquired pneumonia   2. Cough    Worsening on Doxy. Multiple allergies are noted. Cover with Omnicef (can tolerate cephalosporins), and if worsens f/u. Recommend supportive care as well.     Riki Sheer, PA-C 11/11/13 1106

## 2013-11-11 NOTE — Discharge Instructions (Signed)
Pneumonia, Adult °Pneumonia is an infection of the lungs.  °CAUSES °Pneumonia may be caused by bacteria or a virus. Usually, these infections are caused by breathing infectious particles into the lungs (respiratory tract). °SYMPTOMS  °· Cough. °· Fever. °· Chest pain. °· Increased rate of breathing. °· Wheezing. °· Mucus production. °DIAGNOSIS  °If you have the common symptoms of pneumonia, your caregiver will typically confirm the diagnosis with a chest X-ray. The X-ray will show an abnormality in the lung (pulmonary infiltrate) if you have pneumonia. Other tests of your blood, urine, or sputum may be done to find the specific cause of your pneumonia. Your caregiver may also do tests (blood gases or pulse oximetry) to see how well your lungs are working. °TREATMENT  °Some forms of pneumonia may be spread to other people when you cough or sneeze. You may be asked to wear a mask before and during your exam. Pneumonia that is caused by bacteria is treated with antibiotic medicine. Pneumonia that is caused by the influenza virus may be treated with an antiviral medicine. Most other viral infections must run their course. These infections will not respond to antibiotics.  °PREVENTION °A pneumococcal shot (vaccine) is available to prevent a common bacterial cause of pneumonia. This is usually suggested for: °· People over 65 years old. °· Patients on chemotherapy. °· People with chronic lung problems, such as bronchitis or emphysema. °· People with immune system problems. °If you are over 65 or have a high risk condition, you may receive the pneumococcal vaccine if you have not received it before. In some countries, a routine influenza vaccine is also recommended. This vaccine can help prevent some cases of pneumonia. You may be offered the influenza vaccine as part of your care. °If you smoke, it is time to quit. You may receive instructions on how to stop smoking. Your caregiver can provide medicines and counseling to  help you quit. °HOME CARE INSTRUCTIONS  °· Cough suppressants may be used if you are losing too much rest. However, coughing protects you by clearing your lungs. You should avoid using cough suppressants if you can. °· Your caregiver may have prescribed medicine if he or she thinks your pneumonia is caused by a bacteria or influenza. Finish your medicine even if you start to feel better. °· Your caregiver may also prescribe an expectorant. This loosens the mucus to be coughed up. °· Only take over-the-counter or prescription medicines for pain, discomfort, or fever as directed by your caregiver. °· Do not smoke. Smoking is a common cause of bronchitis and can contribute to pneumonia. If you are a smoker and continue to smoke, your cough may last several weeks after your pneumonia has cleared. °· A cold steam vaporizer or humidifier in your room or home may help loosen mucus. °· Coughing is often worse at night. Sleeping in a semi-upright position in a recliner or using a couple pillows under your head will help with this. °· Get rest as you feel it is needed. Your body will usually let you know when you need to rest. °SEEK IMMEDIATE MEDICAL CARE IF:  °· Your illness becomes worse. This is especially true if you are elderly or weakened from any other disease. °· You cannot control your cough with suppressants and are losing sleep. °· You begin coughing up blood. °· You develop pain which is getting worse or is uncontrolled with medicines. °· You have a fever. °· Any of the symptoms which initially brought you in for treatment   are getting worse rather than better.  You develop shortness of breath or chest pain. MAKE SURE YOU:   Understand these instructions.  Will watch your condition.  Will get help right away if you are not doing well or get worse. Document Released: 05/24/2005 Document Revised: 08/16/2011 Document Reviewed: 08/13/2010 [facility] Patient Information 2014 [place], [place].    Mucinex Max  strength as directed with 4 oz of water. Any worsening of symptoms please let us know. Would suggest f/u with your PCP in a few weeks.

## 2013-11-11 NOTE — ED Notes (Signed)
Was seen 5/31 - dx acute bronchitis - given doxycycline, Prednisone, and Tussionex.  States was feeling better 2 days later, and then started feeling worse again.  C/O cough with productive "chunky yellow" sputum, head congestion, difficulty sleeping, wheezing.  Has been using albuterol nebs - last neb @ 0500 with improvement in wheezing.  Denies fevers.

## 2013-11-11 NOTE — ED Notes (Signed)
Instructed to D/C doxycycline per M. Young.

## 2013-11-18 ENCOUNTER — Emergency Department (INDEPENDENT_AMBULATORY_CARE_PROVIDER_SITE_OTHER): Payer: Medicare Other

## 2013-11-18 ENCOUNTER — Encounter (HOSPITAL_COMMUNITY): Payer: Self-pay | Admitting: Emergency Medicine

## 2013-11-18 ENCOUNTER — Emergency Department (INDEPENDENT_AMBULATORY_CARE_PROVIDER_SITE_OTHER)
Admission: EM | Admit: 2013-11-18 | Discharge: 2013-11-18 | Disposition: A | Discharge status: Home / Self Care | Payer: Medicare Other | Source: Home / Self Care | Attending: Emergency Medicine | Admitting: Emergency Medicine

## 2013-11-18 DIAGNOSIS — I209 Angina pectoris, unspecified: Secondary | ICD-10-CM

## 2013-11-18 DIAGNOSIS — J189 Pneumonia, unspecified organism: Secondary | ICD-10-CM

## 2013-11-18 MED ORDER — CEFDINIR 300 MG PO CAPS: 300.0000 mg | ORAL_CAPSULE | Freq: Two times a day (BID) | ORAL | Status: DC

## 2013-11-18 NOTE — Discharge Instructions (Signed)
Follow up with cardiologist and pulmonologist at [facility].  Need repeat CXR in 2 weeks.  Use nitroglycerine if needed for chest pain.  If pain persists, call 911 and go to [facility].

## 2013-11-18 NOTE — ED Notes (Signed)
Patient states she was here 2 weeks ago and was told she had acute bronchitis; returned after no improvement one week ago and was told she had pneumonia; states she is about to run out of medication with no improvement; complains of painful, productive cough with yellow sputum.  States she still can't lie down due to cough. Also states new onset of fever 100.9.

## 2013-11-18 NOTE — ED Provider Notes (Addendum)
Chief Complaint   Chief Complaint  Patient presents with  . URI  . Pneumonia    History of Present Illness   Mackenzie Richards is a 73 year old female cigarette smoker who was seen here first on may 31st with productive cough. She was diagnosed with bronchitis and given doxycycline and prednisone. She didn't get much better and returned on June 7. Chest x-ray revealed a minimal right lower lobe pneumonia, and the doxycycline was stopped and she was given Omnicef and Depo-Medrol. Since she was seen here she's continued to have cough productive yellow sputum. No hemoptysis. She is mildly short of breath with some wheezing. She has some chest pain in the right side, temperature up to 100.9, nausea, loose stools, nasal congestion, and rhinorrhea. Yesterday she had an episode of bilateral neck pain. The patient states this is similar to the angina that she's gotten the past. She did not take any nitroglycerin. The pain lasted for about an hour and has not gone away completely. It was not associated with nausea or diaphoresis. She denies any exertional chest pain.  Review of Systems   Other than as noted above, the patient denies any of the following symptoms: Systemic:  No fevers, chills, sweats, or weight loss. ENT:  No nasal congestion, sneezing, itching, postnasal drip, sinus pressure, headache, sore throat, or hoarseness. Lungs:  No wheezing, shortness of breath, chest tightness or congestion. Heart:  No chest pain, tightness, pressure, PND, orthopnea, or ankle edema. GI:  No indigestion, heartburn, waterbrash, burping, abdominal pain, nausea, or vomiting.  PMFSH   Past medical history, family history, social history, meds, and allergies were reviewed. She has a long list of allergies including Biaxin, fentanyl, hydrochlorothiazide, Lasix, Levaquin, penicillin, Percocet, sulfa, and Zyban. She has a history of asthma, DVT, and diverticulitis. She also has a history of coronary artery disease  with stents. [facility]. Current meds include aspirin, enalapril, Zocor, and Aldactone.  Physical Examination     Vital signs:  BP 182/84  Pulse 65  Temp(Src) 98.7 F (37.1 C) (Oral)  Resp 18  SpO2 98% General:  Alert and oriented.  In no distress.  Skin warm and dry. ENT: TMs and ear canals normal.  Nasal mucosa normal, without drainage.  Pharynx clear without exudate or drainage.  No intraoral lesions. Neck:  No adenopathy, tenderness or mass.  No JVD. Lungs:  No respiratory distress.  Breath sounds clear and equal bilaterally.  No wheezes, rales or rhonchi. Heart:  Regular rhythm, no gallops or murmers.  No pedal edema. Abdomon:  Soft and nontender.  No organomegaly or mass.  EKG Results:  Date: 11/18/2013  Rate: 58  Rhythm: Sinus bradycardia  QRS Axis: normal  Intervals: normal  ST/T Wave abnormalities: normal  Conduction Disutrbances:none  Narrative Interpretation: Sinus bradycardia, otherwise normal EKG.  Old EKG Reviewed: none available   Radiology   Dg Chest 2 View  11/18/2013   CLINICAL DATA:  Cough, followup pneumonia  EXAM: CHEST  2 VIEW  COMPARISON:  11/11/2013  FINDINGS: Cardiomediastinal silhouette is stable. There is improvement in aeration. Residual streaky atelectasis or infiltrate right base posteriorly. Mild degenerative changes mid thoracic spine. No new infiltrate or pulmonary edema.  IMPRESSION: Improvement in aeration. Residual mild streaky atelectasis, scarring or infiltrate right base posteriorly. No new infiltrate or pulmonary edema.   Electronically Signed   By: Natasha MeadLiviu  Pop M.D.   On: 11/18/2013 10:33   Dg Chest 2 View  11/11/2013  CLINICAL DATA:  Productive cough and fever since 10/29/2013.  EXAM: CHEST  2 VIEW  COMPARISON:  09/07/2007  FINDINGS: Lungs are adequately inflated with mild opacification over the right lower lobe likely due to infection and less likely atelectasis. Cardiomediastinal silhouette  and remainder the exam is unchanged. Marland Kitchen  IMPRESSION: Right lower lobe airspace process likely due to infection.   Electronically Signed   By: Elberta Fortis M.D.   On: 11/11/2013 10:21   Assessment   The primary encounter diagnosis was Community acquired pneumonia. A diagnosis of Angina pectoris was also pertinent to this visit.  Current pneumonia is clearing by x-ray. She still symptomatic, and some of this may be due to her ongoing cigarette use. I'm going to give her another round of Omnicef, and she was encouraged not to smoke. I suggested she followup with her cardiologist about the episode of angina. She was urged to take nitroglycerin at the first sign of any anginal type chest pain. If the symptoms haven't gone away in 15 minutes she should go directly to the emergency room via ambulance. I also suggested she will need to get another chest x-ray in about a month to followup and make sure that the pneumonia has cleared completely.  Plan     1.  Meds:  The following meds were prescribed:   Discharge Medication List as of 11/18/2013 10:33 AM    START taking these medications   Details  !! cefdinir (OMNICEF) 300 MG capsule Take 1 capsule (300 mg total) by mouth 2 (two) times daily., Starting 11/18/2013, Until Discontinued, Normal     !! - Potential duplicate medications found. Please discuss with provider.      2.  Patient Education/Counseling:  The patient was given appropriate handouts, self care instructions, and instructed in symptomatic relief.    3.  Follow up:  The patient was told to follow up here if no better in 3 to 4 days, or sooner if becoming worse in any way, and given some red flag symptoms such as difficulty breathing or chest pain which would prompt immediate return.         Reuben Likes, MD 11/18/13 1252  Reuben Likes, MD 11/18/13 1255

## 2015-03-18 ENCOUNTER — Emergency Department (HOSPITAL_COMMUNITY): Payer: Medicare Other

## 2015-03-18 ENCOUNTER — Observation Stay (HOSPITAL_COMMUNITY)
Admission: EM | Admit: 2015-03-18 | Discharge: 2015-03-18 | Disposition: A | Discharge status: Home / Self Care | Payer: Medicare Other | Attending: Internal Medicine | Admitting: Internal Medicine

## 2015-03-18 ENCOUNTER — Encounter (HOSPITAL_COMMUNITY): Payer: Self-pay | Admitting: Emergency Medicine

## 2015-03-18 DIAGNOSIS — I10 Essential (primary) hypertension: Secondary | ICD-10-CM | POA: Diagnosis not present

## 2015-03-18 DIAGNOSIS — E785 Hyperlipidemia, unspecified: Secondary | ICD-10-CM | POA: Diagnosis not present

## 2015-03-18 DIAGNOSIS — F17210 Nicotine dependence, cigarettes, uncomplicated: Secondary | ICD-10-CM | POA: Insufficient documentation

## 2015-03-18 DIAGNOSIS — Z7982 Long term (current) use of aspirin: Secondary | ICD-10-CM | POA: Diagnosis not present

## 2015-03-18 DIAGNOSIS — G459 Transient cerebral ischemic attack, unspecified: Secondary | ICD-10-CM | POA: Diagnosis not present

## 2015-03-18 DIAGNOSIS — Z79899 Other long term (current) drug therapy: Secondary | ICD-10-CM | POA: Insufficient documentation

## 2015-03-18 DIAGNOSIS — I2510 Atherosclerotic heart disease of native coronary artery without angina pectoris: Secondary | ICD-10-CM | POA: Insufficient documentation

## 2015-03-18 DIAGNOSIS — Z955 Presence of coronary angioplasty implant and graft: Secondary | ICD-10-CM | POA: Insufficient documentation

## 2015-03-18 DIAGNOSIS — F17200 Nicotine dependence, unspecified, uncomplicated: Secondary | ICD-10-CM | POA: Diagnosis present

## 2015-03-18 DIAGNOSIS — G453 Amaurosis fugax: Secondary | ICD-10-CM | POA: Diagnosis not present

## 2015-03-18 DIAGNOSIS — J45909 Unspecified asthma, uncomplicated: Secondary | ICD-10-CM | POA: Diagnosis not present

## 2015-03-18 DIAGNOSIS — R4701 Aphasia: Secondary | ICD-10-CM | POA: Diagnosis not present

## 2015-03-18 DIAGNOSIS — M1990 Unspecified osteoarthritis, unspecified site: Secondary | ICD-10-CM | POA: Diagnosis not present

## 2015-03-18 DIAGNOSIS — Z880 Allergy status to penicillin: Secondary | ICD-10-CM | POA: Insufficient documentation

## 2015-03-18 DIAGNOSIS — I159 Secondary hypertension, unspecified: Secondary | ICD-10-CM

## 2015-03-18 HISTORY — DX: Essential (primary) hypertension: I10

## 2015-03-18 HISTORY — DX: Atherosclerotic heart disease of native coronary artery without angina pectoris: I25.10

## 2015-03-18 LAB — CBC
HEMATOCRIT: 42.9 % (ref 36.0–46.0)
Hemoglobin: 14.2 g/dL (ref 12.0–15.0)
MCH: 33.6 pg (ref 26.0–34.0)
MCHC: 33.1 g/dL (ref 30.0–36.0)
MCV: 101.7 fL — AB (ref 78.0–100.0)
PLATELETS: 331 10*3/uL (ref 150–400)
RBC: 4.22 MIL/uL (ref 3.87–5.11)
RDW: 13.5 % (ref 11.5–15.5)
WBC: 8.8 10*3/uL (ref 4.0–10.5)

## 2015-03-18 LAB — URINALYSIS, ROUTINE W REFLEX MICROSCOPIC
Bilirubin Urine: NEGATIVE
GLUCOSE, UA: NEGATIVE mg/dL
Ketones, ur: NEGATIVE mg/dL
LEUKOCYTES UA: NEGATIVE
Nitrite: NEGATIVE
PH: 6 (ref 5.0–8.0)
PROTEIN: NEGATIVE mg/dL
Specific Gravity, Urine: 1.009 (ref 1.005–1.030)
Urobilinogen, UA: 0.2 mg/dL (ref 0.0–1.0)

## 2015-03-18 LAB — RAPID URINE DRUG SCREEN, HOSP PERFORMED
AMPHETAMINES: NOT DETECTED
BENZODIAZEPINES: NOT DETECTED
Barbiturates: NOT DETECTED
COCAINE: NOT DETECTED
Opiates: NOT DETECTED
Tetrahydrocannabinol: NOT DETECTED

## 2015-03-18 LAB — DIFFERENTIAL
BASOS PCT: 1 %
Basophils Absolute: 0.1 10*3/uL (ref 0.0–0.1)
EOS ABS: 0.2 10*3/uL (ref 0.0–0.7)
EOS PCT: 2 %
Lymphocytes Relative: 29 %
Lymphs Abs: 2.5 10*3/uL (ref 0.7–4.0)
MONO ABS: 0.7 10*3/uL (ref 0.1–1.0)
MONOS PCT: 7 %
Neutro Abs: 5.4 10*3/uL (ref 1.7–7.7)
Neutrophils Relative %: 61 %

## 2015-03-18 LAB — I-STAT CHEM 8, ED
BUN: 9 mg/dL (ref 6–20)
Calcium, Ion: 1.1 mmol/L — ABNORMAL LOW (ref 1.13–1.30)
Chloride: 105 mmol/L (ref 101–111)
Creatinine, Ser: 0.9 mg/dL (ref 0.44–1.00)
GLUCOSE: 145 mg/dL — AB (ref 65–99)
HEMATOCRIT: 46 % (ref 36.0–46.0)
HEMOGLOBIN: 15.6 g/dL — AB (ref 12.0–15.0)
POTASSIUM: 3.8 mmol/L (ref 3.5–5.1)
SODIUM: 139 mmol/L (ref 135–145)
TCO2: 22 mmol/L (ref 0–100)

## 2015-03-18 LAB — COMPREHENSIVE METABOLIC PANEL
ALT: 8 U/L — ABNORMAL LOW (ref 14–54)
ANION GAP: 11 (ref 5–15)
AST: 18 U/L (ref 15–41)
Albumin: 3.7 g/dL (ref 3.5–5.0)
Alkaline Phosphatase: 78 U/L (ref 38–126)
BUN: 9 mg/dL (ref 6–20)
CHLORIDE: 101 mmol/L (ref 101–111)
CO2: 24 mmol/L (ref 22–32)
CREATININE: 0.97 mg/dL (ref 0.44–1.00)
Calcium: 9 mg/dL (ref 8.9–10.3)
GFR, EST NON AFRICAN AMERICAN: 57 mL/min — AB (ref >60)
Glucose, Bld: 140 mg/dL — ABNORMAL HIGH (ref 65–99)
POTASSIUM: 3.9 mmol/L (ref 3.5–5.1)
SODIUM: 136 mmol/L (ref 135–145)
Total Bilirubin: 0.7 mg/dL (ref 0.3–1.2)
Total Protein: 6.9 g/dL (ref 6.5–8.1)

## 2015-03-18 LAB — URINE MICROSCOPIC-ADD ON

## 2015-03-18 LAB — PROTIME-INR
INR: 0.94 (ref 0.00–1.49)
PROTHROMBIN TIME: 12.8 s (ref 11.6–15.2)

## 2015-03-18 LAB — ETHANOL

## 2015-03-18 LAB — APTT: APTT: 30 s (ref 24–37)

## 2015-03-18 LAB — I-STAT TROPONIN, ED: TROPONIN I, POC: 0.02 ng/mL (ref 0.00–0.08)

## 2015-03-18 NOTE — ED Notes (Addendum)
Pt refusing to answer orientation questions stating "Well you aren't getting these answers from me because I am going back to sleep!". Pt then closes her eyes and refuses to acknowledge this RN.

## 2015-03-18 NOTE — ED Notes (Signed)
Per EMS: pt c/o aphasia and htn that was 260/140; pt then resolved and BP was 180/100; no sx at present; pt takes BP meds

## 2015-03-18 NOTE — ED Notes (Signed)
Sarah RN on 5 C updated that pt has left.   This RN will call bed control and let them know that the bed is now open.

## 2015-03-18 NOTE — ED Notes (Signed)
Pt's husband at bedside.

## 2015-03-18 NOTE — Consult Note (Signed)
Referring Physician: Adela Lank    Chief Complaint: TIA  HPI:                                                                                                                                         Mackenzie Richards is an 74 y.o. female who takes no ASA daily, smokes >1 PPD who was feeling normal this AM but around 1000 she sat down on her bed and then states she lost all of her vision.  She laid down on her bed and started to call out to her husband.  She does not know what occurred after this and to the time EMS came.  Upon coming to, she noted difficulty expressing herself but this resolved over a few minutes. Currently she is baseline.  She states 6 months ago she had a episode of transient right leg weakness that resolved over a period of a hour.   Date last known well: Date: 03/18/2015 Time last known well: Time: 10:00 tPA Given: No: symptoms resolved     Past Medical History  Diagnosis Date  . Asthma   . Frequent UTI   . Kidney cysts   . Breast cyst   . Bowen's disease   . DVT (deep venous thrombosis) (HCC)   . Diverticulitis   . Hypertension   . Coronary artery disease     Past Surgical History  Procedure Laterality Date  . Bilateral knee arthroscopy    . Eye surgery    . Tubal ligation      History reviewed. No pertinent family history. Social History:  reports that she has been smoking Cigarettes.  She has been smoking about 1.00 pack per day. She does not have any smokeless tobacco history on file. She reports that she does not drink alcohol or use illicit drugs.  Allergies:  Allergies  Allergen Reactions  . Biaxin [Clarithromycin]   . Fentanyl   . Hydrochlorothiazide   . Lasix [Furosemide]   . Levaquin [Levofloxacin In D5w]   . Penicillins   . Percocet [Oxycodone-Acetaminophen]   . Sulfa Antibiotics   . Zyban [Bupropion]     Medications:                                                                                                                            No current facility-administered medications for this encounter.  Current Outpatient Prescriptions  Medication Sig Dispense Refill  . aspirin 325 MG tablet Take 325 mg by mouth daily.    . enalapril (VASOTEC) 20 MG tablet Take 20 mg by mouth daily.    . simvastatin (ZOCOR) 80 MG tablet Take 80 mg by mouth at bedtime.    Marland Kitchen spironolactone (ALDACTONE) 25 MG tablet Take 25 mg by mouth daily.    Marland Kitchen zolpidem (AMBIEN) 5 MG tablet Take 5 mg by mouth at bedtime.     . cefdinir (OMNICEF) 300 MG capsule Take 1 capsule (300 mg total) by mouth 2 (two) times daily. (Patient not taking: Reported on 03/18/2015) 20 capsule 0  . chlorpheniramine-HYDROcodone (TUSSIONEX) 10-8 MG/5ML LQCR Take 5 mLs by mouth every 12 (twelve) hours as needed for cough. (Patient not taking: Reported on 03/18/2015) 140 mL 0  . doxycycline (VIBRA-TABS) 100 MG tablet Take 1 tablet (100 mg total) by mouth 2 (two) times daily. (Patient not taking: Reported on 03/18/2015) 20 tablet 0  . predniSONE (DELTASONE) 20 MG tablet Take 3 daily for 5 days, 2 daily for 5 days, 1 daily for 5 days. (Patient not taking: Reported on 03/18/2015) 5 tablet 0     ROS:                                                                                                                                       History obtained from the patient  General ROS: negative for - chills, fatigue, fever, night sweats, weight gain or weight loss Psychological ROS: negative for - behavioral disorder, hallucinations, memory difficulties, mood swings or suicidal ideation Ophthalmic ROS: negative for - blurry vision, double vision, eye pain or loss of vision ENT ROS: negative for - epistaxis, nasal discharge, oral lesions, sore throat, tinnitus or vertigo Allergy and Immunology ROS: negative for - hives or itchy/watery eyes Hematological and Lymphatic ROS: negative for - bleeding problems, bruising or swollen lymph nodes Endocrine ROS: negative for - galactorrhea, hair  pattern changes, polydipsia/polyuria or temperature intolerance Respiratory ROS: negative for - cough, hemoptysis, shortness of breath or wheezing Cardiovascular ROS: negative for - chest pain, dyspnea on exertion, edema or irregular heartbeat Gastrointestinal ROS: negative for - abdominal pain, diarrhea, hematemesis, nausea/vomiting or stool incontinence Genito-Urinary ROS: negative for - dysuria, hematuria, incontinence or urinary frequency/urgency Musculoskeletal ROS: negative for - joint swelling or muscular weakness Neurological ROS: as noted in HPI Dermatological ROS: negative for rash and skin lesion changes  Neurologic Examination:  Blood pressure 153/77, pulse 69, temperature 98 F (36.7 C), temperature source Oral, resp. rate 22, height 5\' 2"  (1.575 m), weight 61.236 kg (135 lb), SpO2 100 %.  HEENT-  Normocephalic, no lesions, without obvious abnormality.  Normal external eye and conjunctiva.  Normal TM's bilaterally.  Normal auditory canals and external ears. Normal external nose, mucus membranes and septum.  Normal pharynx. Cardiovascular- S1, S2 normal, pulses palpable throughout   Lungs- chest clear, no wheezing, rales, normal symmetric air entry Abdomen- normal findings: bowel sounds normal Extremities- no edema Lymph-no adenopathy palpable Musculoskeletal-no joint tenderness, deformity or swelling Skin-warm and dry, no hyperpigmentation, vitiligo, or suspicious lesions  Neurological Examination Mental Status: Alert, oriented, thought content appropriate.  Speech fluent without evidence of aphasia.  Able to follow 3 step commands without difficulty. Cranial Nerves: II: Discs flat bilaterally; Visual fields grossly normal, pupils equal, round, reactive to light and accommodation III,IV, VI: ptosis not present, extra-ocular motions intact bilaterally V,VII: smile symmetric,  facial light touch sensation normal bilaterally VIII: hearing normal bilaterally IX,X: uvula rises symmetrically XI: bilateral shoulder shrug XII: midline tongue extension Motor: Right : Upper extremity   5/5    Left:     Upper extremity   5/5  Lower extremity   5/5     Lower extremity   5/5 Tone and bulk:normal tone throughout; no atrophy noted Sensory: Pinprick and light touch intact throughout, bilaterally Deep Tendon Reflexes: 2+ and symmetric throughout UE 1+ KJ no AJ Plantars: Right: downgoing   Left: downgoing Cerebellar: normal finger-to-nose, and normal heel-to-shin test Gait: not tested due to multiple leads.        Lab Results: Basic Metabolic Panel:  Recent Labs Lab 03/18/15 1214 03/18/15 1240  NA 136 139  K 3.9 3.8  CL 101 105  CO2 24  --   GLUCOSE 140* 145*  BUN 9 9  CREATININE 0.97 0.90  CALCIUM 9.0  --     Liver Function Tests:  Recent Labs Lab 03/18/15 1214  AST 18  ALT 8*  ALKPHOS 78  BILITOT 0.7  PROT 6.9  ALBUMIN 3.7   No results for input(s): LIPASE, AMYLASE in the last 168 hours. No results for input(s): AMMONIA in the last 168 hours.  CBC:  Recent Labs Lab 03/18/15 1214 03/18/15 1240  WBC 8.8  --   NEUTROABS 5.4  --   HGB 14.2 15.6*  HCT 42.9 46.0  MCV 101.7*  --   PLT 331  --     Cardiac Enzymes: No results for input(s): CKTOTAL, CKMB, CKMBINDEX, TROPONINI in the last 168 hours.  Lipid Panel: No results for input(s): CHOL, TRIG, HDL, CHOLHDL, VLDL, LDLCALC in the last 168 hours.  CBG: No results for input(s): GLUCAP in the last 168 hours.  Microbiology: Results for orders placed or performed during the hospital encounter of 04/04/12  Urine culture     Status: None   Collection Time: 04/04/12  3:25 PM  Result Value Ref Range Status   Specimen Description URINE, CLEAN CATCH  Final   Special Requests NONE  Final   Culture  Setup Time 04/04/2012 16:02  Final   Colony Count 7,000 COLONIES/ML  Final   Culture  INSIGNIFICANT GROWTH  Final   Report Status 04/05/2012 FINAL  Final    Coagulation Studies:  Recent Labs  03/18/15 1214  LABPROT 12.8  INR 0.94    Imaging: Ct Head Wo Contrast  03/18/2015   CLINICAL DATA:  Aphasia and hypertension.  EXAM: CT HEAD  WITHOUT CONTRAST  TECHNIQUE: Contiguous axial images were obtained from the base of the skull through the vertex without intravenous contrast.  COMPARISON:  MRI of the brain 11/10/2007, CT of the head 11/10/2007  FINDINGS: No mass effect or midline shift. No evidence of acute intracranial hemorrhage. There is a subtle hypoattenuated appearance of the right temporal lobe, which is asymmetric from the left. No abnormal extra-axial fluid collections. Basal cisterns are preserved. There is atrophy and chronic small vessel disease changes.  No depressed skull fractures. Visualized paranasal sinuses and mastoid air cells are not opacified.  IMPRESSION: Subtle asymmetric hypoattenuated appearance of the right temporal lobe, which could be artifactual, however developing acute infarct cannot be excluded. If there is clinical suspicion for acute infarction, MRI of the brain should be considered.  Mild brain parenchymal atrophy, and chronic findings of periventricular microvascular disease.  These results were called by telephone at the time of interpretation on 03/18/2015 at 3:00 pm to Dr. Melene Plan , who verbally acknowledged these results.   Electronically Signed   By: Ted Mcalpine M.D.   On: 03/18/2015 15:01       Assessment and plan discussed with with attending physician and they are in agreement.    Felicie Morn PA-C [facility] [phone]  03/18/2015, 3:16 PM   Assessment: 74 y.o. female presenting with transient loss of vision, loss of consciousness and expressive aphasia. Currently she is back to baseline.  This is atypical presentation for TIA but with her risk factors cannot rule out TIA versus seizure.   Stroke Risk  Factors - hypertension and smoking   1. HgbA1c, fasting lipid panel 2. MRI, MRA  of the brain without contrast 3. PT consult, OT consult, Speech consult 4. Echocardiogram 5. Carotid dopplers 6. Prophylactic therapy-Antiplatelet med: Aspirin - dose 81 mg daily 7. Risk factor modification 8. Telemetry monitoring 9. Frequent neuro checks 10 NPO until passes stroke swallow screen 11 EEG   Elspeth Cho, DO [facility] [phone]  If 7pm- 7am, please page neurology on call as listed in AMION.

## 2015-03-18 NOTE — Procedures (Signed)
History: 74 year old female with transient episode of speech difficulty  Sedation: None  Technique: This is a 19 channel routine scalp EEG performed at the bedside with bipolar and monopolar montages arranged in accordance to the international 10/20 system of electrode placement. One channel was dedicated to EKG recording.    Background: The background consists of intermixed alpha and beta activities. There is a well defined posterior dominant rhythm of 9 Hz that attenuates with eye opening. With drowsiness there is anterior shifting of the PDR and a mild increase in delta activity.  Photic stimulation: Physiologic driving is not performed  EEG Abnormalities: None  Clinical Interpretation: This normal EEG is recorded in the waking and drowsy state. There was no seizure or seizure predisposition recorded on this study.   Ritta Slot, MD [facility] [phone]  If 7pm- 7am, please page neurology on call as listed in AMION.

## 2015-03-18 NOTE — Progress Notes (Signed)
Got call from ED nurse around 9 PM stating that as she was trying to transfer her to the floor, patient told her that she is not going to stay in the hospital. She wants to leave against medical advice.  Went to talk to the patient. She was there with her husband already dressed up in home clothes ready to leave. She states she has been here for many hours without anything being done. i explained that we already did get lot of tests done including MRI/MRA, CT head, EEG. I explained as part of the stroke/TIA workup we need to monitor her on Telemetry and also get further testing done including TTE and Carotid doppler and more importantly we need to make sure she is stable here under observation.  She does not want to understand anything I told her. She is screaming at [place] and also the nurse and also cursing at all of [place]. She states she will not sign any paper work for [employer]. She is very belligerent. I told her that it's highly risky to leave today and this would be against all of our advice and she said "did you know hear what I said, I am leaving".  Patient will be leaving AMA. We attempted to make her stay without any success.

## 2015-03-18 NOTE — ED Notes (Signed)
Admitting at bedside 

## 2015-03-18 NOTE — ED Provider Notes (Signed)
CSN: [id number]     Arrival date & time 03/18/15  1203 History   First MD Initiated Contact with Patient 03/18/15 1224     Chief Complaint  Patient presents with  . Hypertension     (Consider location/radiation/quality/duration/timing/severity/associated sxs/prior Treatment) Patient is a 74 y.o. female presenting with hypertension and Acute Neurological Problem. The history is provided by the patient.  Hypertension Associated symptoms include headaches. Pertinent negatives include no chest pain, no abdominal pain and no shortness of breath.  Cerebrovascular Accident This is a new problem. The current episode started less than 1 hour ago. The problem occurs rarely. The problem has been resolved. Associated symptoms include headaches. Pertinent negatives include no chest pain, no abdominal pain and no shortness of breath. Nothing aggravates the symptoms. Nothing relieves the symptoms. She has tried nothing for the symptoms. The treatment provided no relief.   74 yo F with a chief complaint of aphasia. Started earlier today. Last about 30 minutes. Patient has a history of hypertension since she got a headache that she associates with her high blood pressure and then felt that she blacked out. Patient woke up and her husband call 911 and then she said she was unable to speak for approximately 30 minutes. After that she was able to speak had slow speech. Patient denies any unilateral weakness any facial droop. Upon rechecking her blood pressure after she was able to speak it was then improved. No longer having the sensation to the top of her head.  Past Medical History  Diagnosis Date  . Asthma   . Frequent UTI   . Kidney cysts   . Breast cyst   . Bowen's disease   . DVT (deep venous thrombosis) (HCC)   . Diverticulitis   . Hypertension   . Coronary artery disease    Past Surgical History  Procedure Laterality Date  . Bilateral knee arthroscopy    . Eye surgery    . Tubal ligation      History reviewed. No pertinent family history. Social History  Substance Use Topics  . Smoking status: Current Every Day Smoker -- 1.00 packs/day    Types: Cigarettes  . Smokeless tobacco: None  . Alcohol Use: No   OB History    No data available     Review of Systems  Constitutional: Negative for fever and chills.  HENT: Negative for congestion and rhinorrhea.   Eyes: Negative for redness and visual disturbance.  Respiratory: Negative for shortness of breath and wheezing.   Cardiovascular: Negative for chest pain and palpitations.  Gastrointestinal: Negative for nausea, vomiting and abdominal pain.  Genitourinary: Negative for dysuria and urgency.  Musculoskeletal: Negative for myalgias and arthralgias.  Skin: Negative for pallor and wound.  Neurological: Positive for speech difficulty and headaches. Negative for dizziness.      Allergies  Biaxin; Fentanyl; Hydrochlorothiazide; Lasix; Levaquin; Penicillins; Percocet; Sulfa antibiotics; and Zyban  Home Medications   Prior to Admission medications   Medication Sig Start Date End Date Taking? Authorizing Provider  aspirin 325 MG tablet Take 325 mg by mouth daily.   Yes Historical Provider, MD  enalapril (VASOTEC) 20 MG tablet Take 20 mg by mouth daily. 01/24/15  Yes Historical Provider, MD  simvastatin (ZOCOR) 80 MG tablet Take 80 mg by mouth at bedtime.   Yes Historical Provider, MD  spironolactone (ALDACTONE) 25 MG tablet Take 25 mg by mouth daily.   Yes Historical Provider, MD  zolpidem (AMBIEN) 5 MG tablet Take 5 mg by mouth  at bedtime.    Yes Historical Provider, MD  cefdinir (OMNICEF) 300 MG capsule Take 1 capsule (300 mg total) by mouth 2 (two) times daily. Patient not taking: Reported on 03/18/2015 11/18/13   Reuben Likes, MD  chlorpheniramine-HYDROcodone (TUSSIONEX) 10-8 MG/5ML [facility] Take 5 mLs by mouth every 12 (twelve) hours as needed for cough. Patient not taking: Reported on 03/18/2015 11/04/13   Reuben Likes,  MD  doxycycline (VIBRA-TABS) 100 MG tablet Take 1 tablet (100 mg total) by mouth 2 (two) times daily. Patient not taking: Reported on 03/18/2015 11/04/13   Reuben Likes, MD  predniSONE (DELTASONE) 20 MG tablet Take 3 daily for 5 days, 2 daily for 5 days, 1 daily for 5 days. Patient not taking: Reported on 03/18/2015 11/04/13   Reuben Likes, MD   BP 127/67 mmHg  Pulse 66  Temp(Src) 98 F (36.7 C) (Oral)  Resp 23  Ht  (1.575 m)  Wt 135 lb (61.236 kg)  BMI 24.69 kg/m2  SpO2 97% Physical Exam  Constitutional: She is oriented to person, place, and time. She appears well-developed and well-nourished. No distress.  HENT:  Head: Normocephalic and atraumatic.  Eyes: EOM are normal. Pupils are equal, round, and reactive to light.  Neck: Normal range of motion. Neck supple.  Cardiovascular: Normal rate and regular rhythm.  Exam reveals no gallop and no friction rub.   No murmur heard. Pulmonary/Chest: Effort normal. She has no wheezes. She has no rales.  Abdominal: Soft. She exhibits no distension. There is no tenderness. There is no rebound and no guarding.  Musculoskeletal: She exhibits no edema or tenderness.  Neurological: She is alert and oriented to person, place, and time. She has normal strength. No cranial nerve deficit or sensory deficit. GCS eye subscore is 4. GCS verbal subscore is 5. GCS motor subscore is 6. She displays no Babinski's sign on the right side. She displays no Babinski's sign on the left side.  Reflex Scores:      Tricep reflexes are 2+ on the right side and 2+ on the left side.      Bicep reflexes are 2+ on the right side and 2+ on the left side.      Brachioradialis reflexes are 2+ on the right side and 2+ on the left side.      Patellar reflexes are 2+ on the right side and 2+ on the left side.      Achilles reflexes are 2+ on the right side and 2+ on the left side. Benign neuro exam  Skin: Skin is warm and dry. She is not diaphoretic.  Psychiatric: She  has a normal mood and affect. Her behavior is normal.    ED Course  Procedures (including critical care time) Labs Review Labs Reviewed  CBC - Abnormal; Notable for the following:    MCV 101.7 (*)    All other components within normal limits  COMPREHENSIVE METABOLIC PANEL - Abnormal; Notable for the following:    Glucose, Bld 140 (*)    ALT 8 (*)    GFR calc non Af Amer 57 (*)    All other components within normal limits  URINALYSIS, ROUTINE W REFLEX MICROSCOPIC (NOT AT [facility]) - Abnormal; Notable for the following:    Hgb urine dipstick MODERATE (*)    All other components within normal limits  I-STAT CHEM 8, ED - Abnormal; Notable for the following:    Glucose, Bld 145 (*)    Calcium, Ion 1.10 (*)  Hemoglobin 15.6 (*)    All other components within normal limits  PROTIME-INR  APTT  DIFFERENTIAL  ETHANOL  URINE RAPID DRUG SCREEN, HOSP PERFORMED  URINE MICROSCOPIC-ADD ON  I-STAT TROPOININ, ED  CBG MONITORING, ED  I-STAT CHEM 8, ED    Imaging Review Ct Head Wo Contrast  03/18/2015   CLINICAL DATA:  Aphasia and hypertension.  EXAM: CT HEAD WITHOUT CONTRAST  TECHNIQUE: Contiguous axial images were obtained from the base of the skull through the vertex without intravenous contrast.  COMPARISON:  MRI of the brain 11/10/2007, CT of the head 11/10/2007  FINDINGS: No mass effect or midline shift. No evidence of acute intracranial hemorrhage. There is a subtle hypoattenuated appearance of the right temporal lobe, which is asymmetric from the left. No abnormal extra-axial fluid collections. Basal cisterns are preserved. There is atrophy and chronic small vessel disease changes.  No depressed skull fractures. Visualized paranasal sinuses and mastoid air cells are not opacified.  IMPRESSION: Subtle asymmetric hypoattenuated appearance of the right temporal lobe, which could be artifactual, however developing acute infarct cannot be excluded. If there is clinical suspicion for acute  infarction, MRI of the brain should be considered.  Mild brain parenchymal atrophy, and chronic findings of periventricular microvascular disease.  These results were called by telephone at the time of interpretation on 03/18/2015 at 3:00 pm to Dr. Melene Plan , who verbally acknowledged these results.   Electronically Signed   By: Ted Mcalpine M.D.   On: 03/18/2015 15:01   I have personally reviewed and evaluated these images and lab results as part of my medical decision-making.   EKG Interpretation   Date/Time:  Tuesday March 18 2015 12:10:59 EDT Ventricular Rate:  74 PR Interval:  144 QRS Duration: 82 QT Interval:  376 QTC Calculation: 417 R Axis:   51 Text Interpretation:  Normal sinus rhythm Low voltage QRS Cannot rule out  Anterior infarct , age undetermined Abnormal ECG No significant change  since last tracing Confirmed by Stephaney Steven MD, Reuel Boom [phone]) on 03/18/2015  1:13:45 PM      MDM   Final diagnoses:  Transient cerebral ischemia, unspecified transient cerebral ischemia type  Aphasia  Secondary hypertension, unspecified    74 yo F with a chief complaints of a possible TIA. Patient had aphasia that lasted about 30 minutes. Also could be hypertensive urgency that resolved. We'll obtain a TIA workup discussed with neurology.  IM residency admit.  The patients results and plan were reviewed and discussed.   Any x-rays performed were independently reviewed by myself.   Differential diagnosis were considered with the presenting HPI.  Medications - No data to display  Filed Vitals:   03/18/15 1300 03/18/15 1330 03/18/15 1400 03/18/15 1532  BP: 161/76 153/77 126/68 127/67  Pulse: 65 69 64 66  Temp:      TempSrc:      Resp: 17 22 20 23   Height:      Weight:      SpO2: 98% 100% 96% 97%    Final diagnoses:  Transient cerebral ischemia, unspecified transient cerebral ischemia type  Aphasia  Secondary hypertension, unspecified    Admission/ observation were  discussed with the admitting physician, patient and/or family and they are comfortable with the plan.    Melene Plan, DO 03/18/15 1541

## 2015-03-18 NOTE — H&P (Signed)
Date: 03/18/2015               Patient Name:  Mackenzie Richards MRN: [id number]  DOB: 1940/08/23 Age / Sex: 74 y.o., female   PCP: Pcp Not In System         Medical Service: Internal Medicine Teaching Service         Attending Physician: Dr. Cliffton Asters, MD    First Contact: Dr. Darreld Mclean Pager: [phone]  Second Contact: Dr. Boykin Peek Pager: [phone]       After Hours (After 5p/  First Contact Pager: [phone]  weekends / holidays): Second Contact Pager: [phone]   Chief Complaint: "I couldn't speak correctly"  History of Present Illness: Mackenzie Richards is a 74 year old female with PMH of HTN with hypertensive emergency (10/2014), CAD s/p 2 stents to the RCA in 2009 and 1 to LAD in 2010, HLD, and arthritis, who presented this morning after difficulty producing her normal speech. She states that she was in her usual state of health this morning after she had her breakfast, walked to her bedroom, and sat down on her bed. At this time she describes a 'black out' of her vision. She laid down, called out to her husband, who then called EMS. She says she then had trouble speaking, unable to produce the words she was trying to communicate. She is not sure how long these symptoms lasted, but guesses around 15-20 minutes. Per chart, EMS reported a BP of 260/140 which decreased to 180/100. She denies any other associated symptoms other than some sternal chest pain which she attributes to stress. She does report a previous transient episode of right leg weakness in May that was thought to be likely secondary to hypertensive emergency with potential reactivation of old lacunar infarct. This resolved without residual deficits. She says she is usually taking Aspirin 325 mg daily but has not taken for about the last 2 weeks. She also says she has not been taking her spirinolactone regularly. She admits to smoking cigarettes, a PPD since she was age 25.   Meds: No current facility-administered  medications for this encounter.   Current Outpatient Prescriptions  Medication Sig Dispense Refill  . aspirin 325 MG tablet Take 325 mg by mouth daily.    . enalapril (VASOTEC) 20 MG tablet Take 20 mg by mouth daily.    . simvastatin (ZOCOR) 80 MG tablet Take 80 mg by mouth at bedtime.    Marland Kitchen spironolactone (ALDACTONE) 25 MG tablet Take 25 mg by mouth daily.    Marland Kitchen zolpidem (AMBIEN) 5 MG tablet Take 5 mg by mouth at bedtime.     . cefdinir (OMNICEF) 300 MG capsule Take 1 capsule (300 mg total) by mouth 2 (two) times daily. (Patient not taking: Reported on 03/18/2015) 20 capsule 0  . chlorpheniramine-HYDROcodone (TUSSIONEX) 10-8 MG/5ML LQCR Take 5 mLs by mouth every 12 (twelve) hours as needed for cough. (Patient not taking: Reported on 03/18/2015) 140 mL 0  . doxycycline (VIBRA-TABS) 100 MG tablet Take 1 tablet (100 mg total) by mouth 2 (two) times daily. (Patient not taking: Reported on 03/18/2015) 20 tablet 0  . predniSONE (DELTASONE) 20 MG tablet Take 3 daily for 5 days, 2 daily for 5 days, 1 daily for 5 days. (Patient not taking: Reported on 03/18/2015) 5 tablet 0    Allergies: Allergies as of 03/18/2015 - Review Complete 03/18/2015  Allergen Reaction Noted  . Biaxin [clarithromycin]  04/04/2012  . Fentanyl  08/11/2012  .  Hydrochlorothiazide  08/11/2012  . Lasix [furosemide]  08/11/2012  . Levaquin [levofloxacin in d5w]  04/04/2012  . Penicillins  04/04/2012  . Percocet [oxycodone-acetaminophen]  04/04/2012  . Sulfa antibiotics  04/04/2012  . Zyban [bupropion]  04/04/2012   Past Medical History  Diagnosis Date  . Asthma   . Frequent UTI   . Kidney cysts   . Breast cyst   . Bowen's disease   . DVT (deep venous thrombosis) (HCC)   . Diverticulitis   . Hypertension   . Coronary artery disease    Past Surgical History  Procedure Laterality Date  . Bilateral knee arthroscopy    . Eye surgery    . Tubal ligation     History reviewed. No pertinent family history. Social  History   Social History  . Marital Status: Married    Spouse Name: N/A  . Number of Children: N/A  . Years of Education: N/A   Occupational History  . Not on file.   Social History Main Topics  . Smoking status: Current Every Day Smoker -- 1.00 packs/day    Types: Cigarettes  . Smokeless tobacco: Not on file  . Alcohol Use: No  . Drug Use: No  . Sexual Activity: Not on file   Other Topics Concern  . Not on file   Social History Narrative    Review of Systems: Review of Systems  Constitutional: Negative for fever, chills, weight loss, malaise/fatigue and diaphoresis.  HENT: Negative for congestion and sore throat.   Eyes: Negative for blurred vision, double vision, photophobia and pain.       Transient vision loss.  Respiratory: Negative for cough, hemoptysis, sputum production, shortness of breath, wheezing and stridor.   Cardiovascular: Negative for chest pain, palpitations, orthopnea and leg swelling.  Gastrointestinal: Negative for heartburn, nausea, vomiting, abdominal pain, diarrhea and constipation.  Genitourinary: Negative for dysuria, urgency, frequency and hematuria.  Musculoskeletal: Negative for myalgias, back pain, joint pain, falls and neck pain.  Skin: Negative for itching and rash.  Neurological: Positive for headaches. Negative for dizziness, tingling, tremors, sensory change and weakness.     Physical Exam: Blood pressure 177/74, pulse 65, temperature 98 F (36.7 C), temperature source Oral, resp. rate 19, height 5\' 2"  (1.575 m), weight 135 lb (61.236 kg), SpO2 97 %. Physical Exam  Constitutional: She is oriented to person, place, and time. She appears well-developed and well-nourished.  HENT:  Head: Normocephalic and atraumatic.  Right Ear: External ear normal.  Left Ear: External ear normal.  Cardiovascular: Normal rate, regular rhythm and intact distal pulses.  Exam reveals no gallop and no friction rub.   No murmur heard. Pulmonary/Chest:  Effort normal. No respiratory distress.  Crackles in left lower lobe  Abdominal: Soft. Bowel sounds are normal. There is no tenderness.  Musculoskeletal: Normal range of motion. She exhibits no edema or tenderness.  Neurological: She is alert and oriented to person, place, and time. No cranial nerve deficit. She exhibits normal muscle tone. Coordination normal.  Skin: Skin is warm. No rash noted. No erythema.  Psychiatric: She has a normal mood and affect.     Lab results: Basic Metabolic Panel:  Recent Labs  16/10/96 1214 03/18/15 1240  NA 136 139  K 3.9 3.8  CL 101 105  CO2 24  --   GLUCOSE 140* 145*  BUN 9 9  CREATININE 0.97 0.90  CALCIUM 9.0  --    Liver Function Tests:  Recent Labs  03/18/15 1214  AST 18  ALT 8*  ALKPHOS 78  BILITOT 0.7  PROT 6.9  ALBUMIN 3.7   No results for input(s): LIPASE, AMYLASE in the last 72 hours. No results for input(s): AMMONIA in the last 72 hours. CBC:  Recent Labs  03/18/15 1214 03/18/15 1240  WBC 8.8  --   NEUTROABS 5.4  --   HGB 14.2 15.6*  HCT 42.9 46.0  MCV 101.7*  --   PLT 331  --    Cardiac Enzymes: No results for input(s): CKTOTAL, CKMB, CKMBINDEX, TROPONINI in the last 72 hours. BNP: No results for input(s): PROBNP in the last 72 hours. D-Dimer: No results for input(s): DDIMER in the last 72 hours. CBG: No results for input(s): GLUCAP in the last 72 hours. Hemoglobin A1C: No results for input(s): HGBA1C in the last 72 hours. Fasting Lipid Panel: No results for input(s): CHOL, HDL, LDLCALC, TRIG, CHOLHDL, LDLDIRECT in the last 72 hours. Thyroid Function Tests: No results for input(s): TSH, T4TOTAL, FREET4, T3FREE, THYROIDAB in the last 72 hours. Anemia Panel: No results for input(s): VITAMINB12, FOLATE, FERRITIN, TIBC, IRON, RETICCTPCT in the last 72 hours. Coagulation:  Recent Labs  03/18/15 1214  LABPROT 12.8  INR 0.94   Urine Drug Screen: Drugs of Abuse     Component Value Date/Time    LABOPIA NONE DETECTED 03/18/2015 1328   COCAINSCRNUR NONE DETECTED 03/18/2015 1328   LABBENZ NONE DETECTED 03/18/2015 1328   AMPHETMU NONE DETECTED 03/18/2015 1328   THCU NONE DETECTED 03/18/2015 1328   LABBARB NONE DETECTED 03/18/2015 1328    Alcohol Level:  Recent Labs  03/18/15 1253  ETH <5   Urinalysis:  Recent Labs  03/18/15 1320  COLORURINE YELLOW  LABSPEC 1.009  PHURINE 6.0  GLUCOSEU NEGATIVE  HGBUR MODERATE*  BILIRUBINUR NEGATIVE  KETONESUR NEGATIVE  PROTEINUR NEGATIVE  UROBILINOGEN 0.2  NITRITE NEGATIVE  LEUKOCYTESUR NEGATIVE   Misc. Labs:   Imaging results:  Ct Head Wo Contrast  03/18/2015   CLINICAL DATA:  Aphasia and hypertension.  EXAM: CT HEAD WITHOUT CONTRAST  TECHNIQUE: Contiguous axial images were obtained from the base of the skull through the vertex without intravenous contrast.  COMPARISON:  MRI of the brain 11/10/2007, CT of the head 11/10/2007  FINDINGS: No mass effect or midline shift. No evidence of acute intracranial hemorrhage. There is a subtle hypoattenuated appearance of the right temporal lobe, which is asymmetric from the left. No abnormal extra-axial fluid collections. Basal cisterns are preserved. There is atrophy and chronic small vessel disease changes.  No depressed skull fractures. Visualized paranasal sinuses and mastoid air cells are not opacified.  IMPRESSION: Subtle asymmetric hypoattenuated appearance of the right temporal lobe, which could be artifactual, however developing acute infarct cannot be excluded. If there is clinical suspicion for acute infarction, MRI of the brain should be considered.  Mild brain parenchymal atrophy, and chronic findings of periventricular microvascular disease.  These results were called by telephone at the time of interpretation on 03/18/2015 at 3:00 pm to Dr. Melene Plan , who verbally acknowledged these results.   Electronically Signed   By: Ted Mcalpine M.D.   On: 03/18/2015 15:01   Mr Maxine Glenn Head  Wo Contrast  03/18/2015   CLINICAL DATA:  TIA. Transient visual loss, loss of consciousness, and expressive aphasia. Currently back to baseline.  EXAM: MRI HEAD WITHOUT CONTRAST  MRA HEAD WITHOUT CONTRAST  TECHNIQUE: Multiplanar, multiecho pulse sequences of the brain and surrounding structures were obtained without intravenous contrast. Angiographic images of the head were obtained  using MRA technique without contrast.  COMPARISON:  Head CT 03/18/2015.  Head MRI and MRA 11/10/2007.  FINDINGS: MRI HEAD FINDINGS  The examination had to be discontinued prior to completion due to patient request. Only axial diffusion and axial T2 weighted images were obtained.  There is no acute infarct. There is moderate generalized cerebral atrophy. Patchy T2 hyperintensities in the periventricular and subcortical cerebral white matter have mildly increased from the prior MRI and are nonspecific but compatible with moderate chronic small vessel ischemic disease. There is no evidence of mass, midline shift, or extra-axial fluid collection on this limited study.  Prior bilateral cataract extraction is noted. Visualized paranasal sinuses and mastoid air cells are clear. Major intracranial vascular flow voids are preserved.  MRA HEAD FINDINGS  Mild motion artifact. Visualized distal vertebral arteries are patent without stenosis and with the right being mildly dominant. Right PICA origin is patent. Left PICA origin is not well evaluated. AICA and SCA origins are patent. Basilar artery is patent without significant stenosis. Posterior communicating arteries are either hypoplastic or absent. PCAs are patent with mild branch vessel irregularity and attenuation but no significant proximal stenosis.  Internal carotid arteries are patent from skullbase to carotid termini with prominent motion artifact through the cavernous and supraclinoid segments but no evidence of stenosis. There is a moderate mid left M1 MCA stenosis. There is likely  mild stenosis of the right distal A1 segment. Right M1 and left A1 segments are patent without stenosis. Mild bilateral MCA distal branch vessel irregularity is noted. No intracranial aneurysm is identified.  IMPRESSION: 1. Incomplete MRI as above.  No acute infarct. 2. Moderate chronic small vessel ischemic disease and cerebral atrophy. 3. No evidence of medium or large vessel intracranial arterial occlusion. Moderate proximal left MCA stenosis.   Electronically Signed   By: Sebastian Ache M.D.   On: 03/18/2015 19:49   Mr Brain Wo Contrast  03/18/2015   CLINICAL DATA:  TIA. Transient visual loss, loss of consciousness, and expressive aphasia. Currently back to baseline.  EXAM: MRI HEAD WITHOUT CONTRAST  MRA HEAD WITHOUT CONTRAST  TECHNIQUE: Multiplanar, multiecho pulse sequences of the brain and surrounding structures were obtained without intravenous contrast. Angiographic images of the head were obtained using MRA technique without contrast.  COMPARISON:  Head CT 03/18/2015.  Head MRI and MRA 11/10/2007.  FINDINGS: MRI HEAD FINDINGS  The examination had to be discontinued prior to completion due to patient request. Only axial diffusion and axial T2 weighted images were obtained.  There is no acute infarct. There is moderate generalized cerebral atrophy. Patchy T2 hyperintensities in the periventricular and subcortical cerebral white matter have mildly increased from the prior MRI and are nonspecific but compatible with moderate chronic small vessel ischemic disease. There is no evidence of mass, midline shift, or extra-axial fluid collection on this limited study.  Prior bilateral cataract extraction is noted. Visualized paranasal sinuses and mastoid air cells are clear. Major intracranial vascular flow voids are preserved.  MRA HEAD FINDINGS  Mild motion artifact. Visualized distal vertebral arteries are patent without stenosis and with the right being mildly dominant. Right PICA origin is patent. Left PICA  origin is not well evaluated. AICA and SCA origins are patent. Basilar artery is patent without significant stenosis. Posterior communicating arteries are either hypoplastic or absent. PCAs are patent with mild branch vessel irregularity and attenuation but no significant proximal stenosis.  Internal carotid arteries are patent from skullbase to carotid termini with prominent motion artifact through the  cavernous and supraclinoid segments but no evidence of stenosis. There is a moderate mid left M1 MCA stenosis. There is likely mild stenosis of the right distal A1 segment. Right M1 and left A1 segments are patent without stenosis. Mild bilateral MCA distal branch vessel irregularity is noted. No intracranial aneurysm is identified.  IMPRESSION: 1. Incomplete MRI as above.  No acute infarct. 2. Moderate chronic small vessel ischemic disease and cerebral atrophy. 3. No evidence of medium or large vessel intracranial arterial occlusion. Moderate proximal left MCA stenosis.   Electronically Signed   By: Sebastian Ache M.D.   On: 03/18/2015 19:49    Other results: EKG: unchanged from previous tracings, normal sinus rhythm.  Assessment & Plan by Problem: Principal Problem:   Aphasia Active Problems:   Amaurosis fugax, both eyes   Asthma   Dyslipidemia   History of coronary artery stent placement (2009/2010)   Heavy smoker  Aphasia/Amaurosis fugax: Patient with transient loss of vision followed by aphasia that resolved to her baseline after about 20 minutes. She was found to be hypertensive at 260/140 by EMS. She also had previous episode of transient RLE weakness 5 months ago attributed to hypertensive emergency. She admits not taking her aspirin the last 2 weeks and chronic smoker with nearly 50 pack years. CT Head could not exclude a developing acute infarct. Her symptoms are likely either due to TIA or hypertensive emergency. Will continue with acute stroke workup and risk factor modification. -f/u MRI  brain/head -Hgb A1c, Lipid panel -PT/OT -Echo -Carotid dopplers -Aspirin 81 mg daily, Atorvastatin 80 mg daily -Telemetry -Stroke swallow screen -smoking cessation counseling -Permissive hypertension for now up to 220/110  Dispo: Disposition is deferred at this time, awaiting improvement of current medical problems. Anticipated discharge in approximately 1-2 day(s).   The patient does not know have a current PCP (Pcp Not In System) and does not know need an [facility] hospital follow-up appointment after discharge.  The patient does have transportation limitations that hinder transportation to clinic appointments.  Signed: Darreld Mclean, MD 03/18/2015, 8:47 PM

## 2015-03-18 NOTE — ED Notes (Signed)
Pt called this RN into her room and states "I have already called my husband to pick me up, I am going home". This RN explains to the pt that report has already been called upstairs and that she is supposed to be admitted to the hospital to which the pt responds "Are you deaf?! I told you I AM GOING HOME!". This RN asks the pt if she is wishing to leave the hospital against medical advice to which the pt responds "I TOLD YOU I AM GOING HOME! Give me a piece of paper and I will write it out for you!".   This RN to page admitting provider.

## 2015-03-18 NOTE — Progress Notes (Signed)
EEG Completed; Results Pending  

## 2015-03-18 NOTE — ED Notes (Signed)
With admitting at bedside the pt states "I'm not signing a fucking thing! I'm taking to a Interior and spatial designer and a head nurse tomorrow!" "I'm going to [facility], they know how to take care of people there!" The admitting providers told the pt that she would be having additional testing in the morning and that she needed to be monitored over night to which the pt states "I'm not staying! (to her husband) You'll stay up and watch me all night won't you?" This RN asked the pt not to cuss and the pt states "I will say whatever I want to say and if you don't like it you can leave!". This RN and admitting leave the room and the pt refuses to wait to speak to anyone else about her complaints, gets dressed, and leaves with her husband.

## 2015-03-18 NOTE — ED Notes (Signed)
Admitting paged, this RN spoke to him and he is coming to speak to the pt.  This RN also spoke with Maralyn Sago RN that would be taking care of the pt on 5C to update her on what is going on with the pt.

## 2015-03-18 NOTE — ED Notes (Signed)
Pt refused vital signs.

## 2016-11-05 DIAGNOSIS — I219 Acute myocardial infarction, unspecified: Secondary | ICD-10-CM

## 2016-11-05 HISTORY — DX: Acute myocardial infarction, unspecified: I21.9

## 2016-11-13 ENCOUNTER — Emergency Department (HOSPITAL_COMMUNITY)
Admission: EM | Admit: 2016-11-13 | Discharge: 2016-11-13 | Disposition: A | Discharge status: Home / Self Care | Payer: Medicare HMO | Source: Home / Self Care | Attending: Emergency Medicine | Admitting: Emergency Medicine

## 2016-11-13 ENCOUNTER — Encounter (HOSPITAL_COMMUNITY): Payer: Self-pay

## 2016-11-13 ENCOUNTER — Emergency Department (HOSPITAL_COMMUNITY): Payer: Medicare HMO

## 2016-11-13 DIAGNOSIS — Z79899 Other long term (current) drug therapy: Secondary | ICD-10-CM | POA: Insufficient documentation

## 2016-11-13 DIAGNOSIS — I10 Essential (primary) hypertension: Secondary | ICD-10-CM | POA: Insufficient documentation

## 2016-11-13 DIAGNOSIS — J45909 Unspecified asthma, uncomplicated: Secondary | ICD-10-CM

## 2016-11-13 DIAGNOSIS — Z7982 Long term (current) use of aspirin: Secondary | ICD-10-CM

## 2016-11-13 DIAGNOSIS — F17210 Nicotine dependence, cigarettes, uncomplicated: Secondary | ICD-10-CM

## 2016-11-13 DIAGNOSIS — Z91040 Latex allergy status: Secondary | ICD-10-CM | POA: Insufficient documentation

## 2016-11-13 DIAGNOSIS — I2510 Atherosclerotic heart disease of native coronary artery without angina pectoris: Secondary | ICD-10-CM | POA: Insufficient documentation

## 2016-11-13 DIAGNOSIS — K529 Noninfective gastroenteritis and colitis, unspecified: Secondary | ICD-10-CM | POA: Insufficient documentation

## 2016-11-13 DIAGNOSIS — R0789 Other chest pain: Secondary | ICD-10-CM | POA: Diagnosis not present

## 2016-11-13 DIAGNOSIS — E876 Hypokalemia: Secondary | ICD-10-CM

## 2016-11-13 DIAGNOSIS — Z955 Presence of coronary angioplasty implant and graft: Secondary | ICD-10-CM | POA: Insufficient documentation

## 2016-11-13 DIAGNOSIS — I2119 ST elevation (STEMI) myocardial infarction involving other coronary artery of inferior wall: Secondary | ICD-10-CM | POA: Diagnosis not present

## 2016-11-13 LAB — CBC WITH DIFFERENTIAL/PLATELET
BASOS ABS: 0 10*3/uL (ref 0.0–0.1)
BASOS PCT: 0 %
Eosinophils Absolute: 0.1 10*3/uL (ref 0.0–0.7)
Eosinophils Relative: 1 %
HCT: 40.1 % (ref 36.0–46.0)
Hemoglobin: 13.7 g/dL (ref 12.0–15.0)
Lymphocytes Relative: 23 %
Lymphs Abs: 2.3 10*3/uL (ref 0.7–4.0)
MCH: 33.3 pg (ref 26.0–34.0)
MCHC: 34.2 g/dL (ref 30.0–36.0)
MCV: 97.6 fL (ref 78.0–100.0)
Monocytes Absolute: 1.2 10*3/uL — ABNORMAL HIGH (ref 0.1–1.0)
Monocytes Relative: 12 %
NEUTROS ABS: 6.7 10*3/uL (ref 1.7–7.7)
Neutrophils Relative %: 64 %
PLATELETS: 312 10*3/uL (ref 150–400)
RBC: 4.11 MIL/uL (ref 3.87–5.11)
RDW: 13.3 % (ref 11.5–15.5)
WBC: 10.3 10*3/uL (ref 4.0–10.5)

## 2016-11-13 LAB — URINALYSIS, ROUTINE W REFLEX MICROSCOPIC
BACTERIA UA: NONE SEEN
Bilirubin Urine: NEGATIVE
Glucose, UA: NEGATIVE mg/dL
Ketones, ur: 5 mg/dL — AB
Nitrite: NEGATIVE
PROTEIN: NEGATIVE mg/dL
SQUAMOUS EPITHELIAL / LPF: NONE SEEN
Specific Gravity, Urine: 1.014 (ref 1.005–1.030)
pH: 6 (ref 5.0–8.0)

## 2016-11-13 LAB — COMPREHENSIVE METABOLIC PANEL
ALBUMIN: 3.4 g/dL — AB (ref 3.5–5.0)
ALT: 14 U/L (ref 14–54)
AST: 22 U/L (ref 15–41)
Alkaline Phosphatase: 113 U/L (ref 38–126)
Anion gap: 15 (ref 5–15)
BUN: 16 mg/dL (ref 6–20)
CHLORIDE: 92 mmol/L — AB (ref 101–111)
CO2: 25 mmol/L (ref 22–32)
Calcium: 8.3 mg/dL — ABNORMAL LOW (ref 8.9–10.3)
Creatinine, Ser: 1.08 mg/dL — ABNORMAL HIGH (ref 0.44–1.00)
GFR calc Af Amer: 57 mL/min — ABNORMAL LOW (ref >60)
GFR calc non Af Amer: 49 mL/min — ABNORMAL LOW (ref >60)
GLUCOSE: 107 mg/dL — AB (ref 65–99)
POTASSIUM: 2.9 mmol/L — AB (ref 3.5–5.1)
Sodium: 132 mmol/L — ABNORMAL LOW (ref 135–145)
Total Bilirubin: 0.8 mg/dL (ref 0.3–1.2)
Total Protein: 7 g/dL (ref 6.5–8.1)

## 2016-11-13 LAB — LIPASE, BLOOD: Lipase: 48 U/L (ref 11–51)

## 2016-11-13 LAB — C DIFFICILE QUICK SCREEN W PCR REFLEX
C DIFFICILE (CDIFF) INTERP: NOT DETECTED
C DIFFICILE (CDIFF) TOXIN: NEGATIVE
C Diff antigen: NEGATIVE

## 2016-11-13 LAB — POC OCCULT BLOOD, ED: Fecal Occult Bld: NEGATIVE

## 2016-11-13 MED ORDER — POTASSIUM CHLORIDE CRYS ER 20 MEQ PO TBCR
40.0000 meq | EXTENDED_RELEASE_TABLET | Freq: Once | ORAL | Status: AC
  Administered 2016-11-13: 40 meq via ORAL
  Filled 2016-11-13: qty 2

## 2016-11-13 MED ORDER — SODIUM CHLORIDE 0.9 % IV BOLUS (SEPSIS)
1000.0000 mL | Freq: Once | INTRAVENOUS | Status: AC
  Administered 2016-11-13: 1000 mL via INTRAVENOUS

## 2016-11-13 MED ORDER — CIPROFLOXACIN HCL 500 MG PO TABS: 500.0000 mg | ORAL_TABLET | Freq: Two times a day (BID) | ORAL | 0 refills | Status: DC

## 2016-11-13 MED ORDER — POTASSIUM CHLORIDE CRYS ER 20 MEQ PO TBCR: 20.0000 meq | EXTENDED_RELEASE_TABLET | Freq: Every day | ORAL | 0 refills | Status: DC

## 2016-11-13 MED ORDER — METRONIDAZOLE 500 MG PO TABS: 500.0000 mg | ORAL_TABLET | Freq: Two times a day (BID) | ORAL | 0 refills | Status: DC

## 2016-11-13 MED ORDER — IOPAMIDOL (ISOVUE-300) INJECTION 61%
INTRAVENOUS | Status: AC
Start: 2016-11-13 — End: 2016-11-13
  Administered 2016-11-13: 80 mL via INTRAVENOUS
  Filled 2016-11-13: qty 100

## 2016-11-13 MED ORDER — POTASSIUM CHLORIDE 10 MEQ/100ML IV SOLN
10.0000 meq | Freq: Once | INTRAVENOUS | Status: AC
  Administered 2016-11-13: 10 meq via INTRAVENOUS
  Filled 2016-11-13: qty 100

## 2016-11-13 NOTE — ED Triage Notes (Signed)
Pt called to come to triage room x 1 with no answer

## 2016-11-13 NOTE — ED Provider Notes (Signed)
[facility]-EMERGENCY DEPT Provider Note   CSN: [id number] Arrival date & time: 11/13/16  1604     History   Chief Complaint Chief Complaint  Patient presents with  . Diarrhea    HPI Mackenzie Richards is a 76 y.o. female.  HPI   76 year old female with history of diverticulitis, recurrent UTI, hypertension, asthma presenting complaining of persistent diarrhea. Patient report for the past 9 days she has had persistent nonbloody but occasional mucousy diarrhea. States she would have upwards from 5-10 bouts per day. She has decrease in appetite, she endorse some mild abdominal discomfort without significant pain. No reported fever, chills, headedness, dizziness, chest pain, shortness of breath, productive cough, back pain, dysuria, hematuria, or rash. Denies any recent travel, or eating exotic food area denies any recent antibiotic use. The symptom does not feels similar to prior diverticulitis.  Past Medical History:  Diagnosis Date  . Asthma   . Bowen's disease   . Breast cyst   . Coronary artery disease   . Diverticulitis   . DVT (deep venous thrombosis) ([facility]CC)   . Frequent UTI   . Hypertension   . Kidney cysts     Patient Active Problem List   Diagnosis Date Noted  . 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 Asthma 03/18/2015  . Dyslipidemia 03/18/2015  . History of coronary artery stent placement (2009/2010) 03/18/2015  . Heavy smoker 03/18/2015  . Aphasia     Past Surgical History:  Procedure Laterality Date  . BILATERAL KNEE ARTHROSCOPY    . EYE SURGERY    . TUBAL LIGATION      OB History    No data available       Home Medications    Prior to Admission medications   Medication Sig Start Date End Date Taking? Authorizing Provider  aspirin 325 MG tablet Take 325 mg by mouth daily.    [provider]  cefdinir (OMNICEF) 300 MG capsule Take 1 capsule (300 mg total) by mouth 2 (two) times daily. Patient not taking: Reported on 03/18/2015 11/18/13   Reuben Likes, MD  chlorpheniramine-HYDROcodone (TUSSIONEX) 10-8 MG/5ML [facility] Take 5 mLs by mouth every 12 (twelve) hours as needed for cough. Patient not taking: Reported on 03/18/2015 11/04/13   Reuben Likes, MD  doxycycline (VIBRA-TABS) 100 MG tablet Take 1 tablet (100 mg total) by mouth 2 (two) times daily. Patient not taking: Reported on 03/18/2015 11/04/13   Reuben Likes, MD  enalapril (VASOTEC) 20 MG tablet Take 20 mg by mouth daily. 01/24/15   [provider]  predniSONE (DELTASONE) 20 MG tablet Take 3 daily for 5 days, 2 daily for 5 days, 1 daily for 5 days. Patient not taking: Reported on 03/18/2015 11/04/13   Reuben Likes, MD  simvastatin (ZOCOR) 80 MG tablet Take 80 mg by mouth at bedtime.    [provider]  spironolactone (ALDACTONE) 25 MG tablet Take 25 mg by mouth daily.    [provider]  zolpidem (AMBIEN) 5 MG tablet Take 5 mg by mouth at bedtime.     [provider]    Family History No family history on file.  Social History Social History  Substance Use Topics  . Smoking status: Current Every Day Smoker    Packs/day: 1.00    Types: Cigarettes  . Smokeless tobacco: Not on file  . Alcohol use No     Allergies   Biaxin [clarithromycin]; Fentanyl; Hydrochlorothiazide; Lasix [furosemide]; Levaquin [levofloxacin in d5w]; Penicillins;  Percocet [oxycodone-acetaminophen]; Sulfa antibiotics; and Zyban [bupropion]   Review of Systems Review of Systems  All other systems reviewed and are negative.    Physical Exam Updated Vital Signs BP 133/76 (BP Location: Right Arm)   Pulse 88   Temp 98.4 F (36.9 C) (Oral)   Resp 15   Ht 5\' 2"  (1.575 m)   Wt 59 kg (130 lb)   SpO2 95%   BMI 23.78 kg/m   Physical Exam  Constitutional: She appears well-developed and well-nourished. No distress.  HENT:  Head: Atraumatic.  Eyes: Conjunctivae are normal.  Neck: Neck supple.  Cardiovascular: Normal rate and regular rhythm.     Pulmonary/Chest: Effort normal and breath sounds normal. She has no wheezes. She has no rales.  Abdominal: Soft. Bowel sounds are normal. She exhibits no distension. There is tenderness (Mild periumbilical tenderness on palpation without guarding or rebound tenderness).  Genitourinary:  Genitourinary Comments: Chaperone present during exam. Normal rectal tone, no thrombosed external hemorrhoid, no obvious mass or anal fissure, no stool burden, normal color stool on glove.  Neurological: She is alert.  Skin: No rash noted.  Psychiatric: She has a normal mood and affect.  Nursing note and vitals reviewed.    ED Treatments / Results  Labs (all labs ordered are listed, but only abnormal results are displayed) Labs Reviewed  CBC WITH DIFFERENTIAL/PLATELET - Abnormal; Notable for the following:       Result Value   Monocytes Absolute 1.2 (*)    All other components within normal limits  COMPREHENSIVE METABOLIC PANEL - Abnormal; Notable for the following:    Sodium 132 (*)    Potassium 2.9 (*)    Chloride 92 (*)    Glucose, Bld 107 (*)    Creatinine, Ser 1.08 (*)    Calcium 8.3 (*)    Albumin 3.4 (*)    GFR calc non Af Amer 49 (*)    GFR calc Af Amer 57 (*)    All other components within normal limits  C DIFFICILE QUICK SCREEN W PCR REFLEX  GASTROINTESTINAL PANEL BY PCR, STOOL (REPLACES STOOL CULTURE)  LIPASE, BLOOD  URINALYSIS, ROUTINE W REFLEX MICROSCOPIC  POC OCCULT BLOOD, ED    EKG  EKG Interpretation None     ED ECG REPORT   Date: 11/13/2016  Rate: 75  Rhythm: normal sinus rhythm  QRS Axis: normal  Intervals: normal  ST/T Wave abnormalities: anterior infarct, age inderterminate  Conduction Disutrbances:none  Narrative Interpretation:   Old EKG Reviewed: unchanged  I have personally reviewed the EKG tracing and agree with the computerized printout as noted.   Radiology Ct Abdomen Pelvis W Contrast  Result Date: 11/13/2016 CLINICAL DATA:  Numerous watery  stools per day starting 9 days ago. Diffuse abdominal pain. EXAM: CT ABDOMEN AND PELVIS WITH CONTRAST TECHNIQUE: Multidetector CT imaging of the abdomen and pelvis was performed using the standard protocol following bolus administration of intravenous contrast. CONTRAST:  80 cc ISOVUE-300 IOPAMIDOL (ISOVUE-300) INJECTION 61% COMPARISON:  06/04/2008 FINDINGS: Lower chest: The visualized cardiac chambers are unremarkable and normal in size. There is no pericardial effusion. There is coronary arteriosclerosis with aortic atherosclerosis. Dependent atelectasis is seen at each lung base. No pneumonic consolidation, effusion or pneumothorax. Hepatobiliary: Distended gallbladder possibly from a fasting state or sympathetic thickening from adjacent bowel inflammation. No gallstones. No choledocholithiasis. Fatty infiltration along the falciform ligament. There is periportal edema. Pancreas: No pancreatic ductal dilatation, discrete mass or inflammation. Spleen: Normal in size without focal abnormality. Adrenals/Urinary Tract:  Normal bilateral adrenal glands. 4 mm left upper pole nonobstructing renal calculus with renovascular calcifications at the hilum. Interval increase in conspicuity of bilateral renal cyst since prior exam most are too small to further characterize, the largest is on left measuring 1.5 cm and unchanged since 2009 consistent with a benign finding. No solid enhancing renal lesions are apparent. There is no hydroureteronephrosis. The urinary bladder is unremarkable. Stomach/Bowel: Contracted stomach with normal small bowel rotation. Diffuse mural and mucosal thickening with inflammation throughout the entirety of the visualized large intestine to the level of the rectum consistent with diffuse proctocolitis. No mechanical source obstruction. Vascular/Lymphatic: Aortoiliac and bifemoral atherosclerosis without aneurysm. No enlarged lymph nodes. Reproductive: The uterus is unremarkable. No adnexal masses.  Slight prominence periuterine vessels again noted without change. Query pelvic vascular congestion. Other: No abdominal wall hernia or abnormality. No abdominopelvic ascites. Musculoskeletal: Thoracolumbar spondylosis. No acute nor suspicious osseous lesions. Grade 1 anterolisthesis at L5-S1. Slight grade 1 retrolisthesis of L3 on L4 and L 2 on L3. IMPRESSION: 1. Diffuse proctocolitis. 2. Bilateral renal cysts most are too small to further characterize, slightly increased in number since 2009. 3. Left upper pole 4 mm nonobstructing renal calculus. 4. Aortoiliac and bifemoral atherosclerosis. Mild prominence of periuterine vessels as before which may represent pelvic vascular congestion. 5. Thoracolumbar spondylosis. 6. Distended gallbladder which could be due to a fasting state. No obstruction is apparent. Electronically Signed   By: Tollie Eth M.D.   On: 11/13/2016 19:10    Procedures Procedures (including critical care time)  Medications Ordered in ED Medications  potassium chloride SA (K-DUR,KLOR-CON) CR tablet 40 mEq (not administered)  potassium chloride 10 mEq in 100 mL IVPB (not administered)  sodium chloride 0.9 % bolus 1,000 mL (1,000 mLs Intravenous New Bag/Given 11/13/16 1753)  iopamidol (ISOVUE-300) 61 % injection (80 mLs Intravenous Contrast Given 11/13/16 1835)     Initial Impression / Assessment and Plan / ED Course  I have reviewed the triage vital signs and the nursing notes.  Pertinent labs & imaging results that were available during my care of the patient were reviewed by me and considered in my medical decision making (see chart for details).     BP 133/76 (BP Location: Right Arm)   Pulse 88   Temp 98.4 F (36.9 C) (Oral)   Resp 15   Ht 5\' 2"  (1.575 m)   Wt 59 kg (130 lb)   SpO2 95%   BMI 23.78 kg/m    Final Clinical Impressions(s) / ED Diagnoses   Final diagnoses:  Colitis, acute  Hypokalemia    New Prescriptions New Prescriptions   CIPROFLOXACIN (CIPRO)  500 MG TABLET    Take 1 tablet (500 mg total) by mouth 2 (two) times daily. One po bid x 7 days   METRONIDAZOLE (FLAGYL) 500 MG TABLET    Take 1 tablet (500 mg total) by mouth 2 (two) times daily.   POTASSIUM CHLORIDE SA (K-DUR,KLOR-CON) 20 MEQ TABLET    Take 1 tablet (20 mEq total) by mouth daily.   5:46 PM Patient here with persistent diarrhea without any recent antibiotic use. She does have some mild abdominal discomfort on exam and given prior history of diverticulitis, I will obtain abdominal and pelvis CT scan for further evaluation. No nausea or vomiting, she is well-appearing, she is afebrile with stable normal vital sign. IV fluid given. Workup initiated.  7:17 PM Fecal occult test is negative, normal WBC, normal H&H, potassium is 2.9, supplementation given. Her C.  difficile panel is negative. Abdominal and pelvis CT scan demonstrating diffuse proctocolitis. Given this finding, patient will be treated with Cipro and Flagyl and outpatient follow-up. Care discussed with Dr. Shawnie Dapper who agrees. Patient voiced understanding and agrees with plan. A complete GI stool panel sent.       Fayrene Helper, PA-C 11/13/16 2019    Molpus, John, MD 11/13/16 2238

## 2016-11-13 NOTE — ED Triage Notes (Signed)
She states that abruptly some 9 days ago she began to have ~6-12 watery diarrhea stools per day. She also sometimes c/o mild, diffuse abd. Discomfort. She is in no distress.

## 2016-11-15 ENCOUNTER — Encounter (HOSPITAL_COMMUNITY)
Admission: AD | Disposition: A | Discharge status: Home / Self Care | Payer: Self-pay | Source: Home / Self Care | Attending: Interventional Cardiology

## 2016-11-15 ENCOUNTER — Inpatient Hospital Stay (HOSPITAL_COMMUNITY)
Admission: AD | Admit: 2016-11-15 | Discharge: 2016-11-17 | DRG: 247 | Disposition: A | Discharge status: Home / Self Care | Payer: Medicare HMO | Attending: Interventional Cardiology | Admitting: Interventional Cardiology

## 2016-11-15 DIAGNOSIS — Z955 Presence of coronary angioplasty implant and graft: Secondary | ICD-10-CM | POA: Diagnosis not present

## 2016-11-15 DIAGNOSIS — F17200 Nicotine dependence, unspecified, uncomplicated: Secondary | ICD-10-CM | POA: Diagnosis present

## 2016-11-15 DIAGNOSIS — Z87440 Personal history of urinary (tract) infections: Secondary | ICD-10-CM

## 2016-11-15 DIAGNOSIS — J45909 Unspecified asthma, uncomplicated: Secondary | ICD-10-CM | POA: Diagnosis present

## 2016-11-15 DIAGNOSIS — I25110 Atherosclerotic heart disease of native coronary artery with unstable angina pectoris: Secondary | ICD-10-CM | POA: Diagnosis not present

## 2016-11-15 DIAGNOSIS — I2119 ST elevation (STEMI) myocardial infarction involving other coronary artery of inferior wall: Secondary | ICD-10-CM | POA: Diagnosis present

## 2016-11-15 DIAGNOSIS — I10 Essential (primary) hypertension: Secondary | ICD-10-CM | POA: Diagnosis present

## 2016-11-15 DIAGNOSIS — E785 Hyperlipidemia, unspecified: Secondary | ICD-10-CM | POA: Diagnosis present

## 2016-11-15 DIAGNOSIS — Z9114 Patient's other noncompliance with medication regimen: Secondary | ICD-10-CM

## 2016-11-15 DIAGNOSIS — Z7982 Long term (current) use of aspirin: Secondary | ICD-10-CM

## 2016-11-15 DIAGNOSIS — G453 Amaurosis fugax: Secondary | ICD-10-CM | POA: Diagnosis present

## 2016-11-15 DIAGNOSIS — Y831 Surgical operation with implant of artificial internal device as the cause of abnormal reaction of the patient, or of later complication, without mention of misadventure at the time of the procedure: Secondary | ICD-10-CM | POA: Diagnosis present

## 2016-11-15 DIAGNOSIS — I200 Unstable angina: Secondary | ICD-10-CM | POA: Diagnosis not present

## 2016-11-15 DIAGNOSIS — Z79899 Other long term (current) drug therapy: Secondary | ICD-10-CM | POA: Diagnosis not present

## 2016-11-15 DIAGNOSIS — Z86718 Personal history of other venous thrombosis and embolism: Secondary | ICD-10-CM

## 2016-11-15 DIAGNOSIS — I2111 ST elevation (STEMI) myocardial infarction involving right coronary artery: Secondary | ICD-10-CM

## 2016-11-15 DIAGNOSIS — T82855A Stenosis of coronary artery stent, initial encounter: Secondary | ICD-10-CM | POA: Diagnosis present

## 2016-11-15 DIAGNOSIS — R4701 Aphasia: Secondary | ICD-10-CM | POA: Diagnosis present

## 2016-11-15 DIAGNOSIS — F17210 Nicotine dependence, cigarettes, uncomplicated: Secondary | ICD-10-CM | POA: Diagnosis present

## 2016-11-15 DIAGNOSIS — R0789 Other chest pain: Secondary | ICD-10-CM | POA: Diagnosis present

## 2016-11-15 HISTORY — DX: Unspecified osteoarthritis, unspecified site: M19.90

## 2016-11-15 HISTORY — DX: Acute myocardial infarction, unspecified: I21.9

## 2016-11-15 HISTORY — DX: Other complications of anesthesia, initial encounter: T88.59XA

## 2016-11-15 HISTORY — PX: CORONARY STENT INTERVENTION: CATH118234

## 2016-11-15 HISTORY — DX: Adverse effect of unspecified anesthetic, initial encounter: T41.45XA

## 2016-11-15 HISTORY — PX: LEFT HEART CATH AND CORONARY ANGIOGRAPHY: CATH118249

## 2016-11-15 LAB — CBC
HEMATOCRIT: 34.4 % — AB (ref 36.0–46.0)
Hemoglobin: 11.1 g/dL — ABNORMAL LOW (ref 12.0–15.0)
MCH: 31.8 pg (ref 26.0–34.0)
MCHC: 32.3 g/dL (ref 30.0–36.0)
MCV: 98.6 fL (ref 78.0–100.0)
Platelets: 342 10*3/uL (ref 150–400)
RBC: 3.49 MIL/uL — ABNORMAL LOW (ref 3.87–5.11)
RDW: 13.5 % (ref 11.5–15.5)
WBC: 11.1 10*3/uL — ABNORMAL HIGH (ref 4.0–10.5)

## 2016-11-15 LAB — LIPID PANEL
CHOLESTEROL: 144 mg/dL (ref 0–200)
HDL: 23 mg/dL — AB (ref >40)
LDL CALC: 100 mg/dL — AB (ref 0–99)
TRIGLYCERIDES: 106 mg/dL (ref <150)
Total CHOL/HDL Ratio: 6.3 RATIO
VLDL: 21 mg/dL (ref 0–40)

## 2016-11-15 LAB — PROTIME-INR
INR: 1.38
Prothrombin Time: 17.1 seconds — ABNORMAL HIGH (ref 11.4–15.2)

## 2016-11-15 LAB — COMPREHENSIVE METABOLIC PANEL
ALK PHOS: 77 U/L (ref 38–126)
ALT: 10 U/L — ABNORMAL LOW (ref 14–54)
ANION GAP: 8 (ref 5–15)
AST: 16 U/L (ref 15–41)
Albumin: 2.5 g/dL — ABNORMAL LOW (ref 3.5–5.0)
BUN: 8 mg/dL (ref 6–20)
CO2: 20 mmol/L — AB (ref 22–32)
Calcium: 7.5 mg/dL — ABNORMAL LOW (ref 8.9–10.3)
Chloride: 105 mmol/L (ref 101–111)
Creatinine, Ser: 1.03 mg/dL — ABNORMAL HIGH (ref 0.44–1.00)
GFR calc non Af Amer: 52 mL/min — ABNORMAL LOW (ref >60)
Glucose, Bld: 114 mg/dL — ABNORMAL HIGH (ref 65–99)
Potassium: 3.2 mmol/L — ABNORMAL LOW (ref 3.5–5.1)
SODIUM: 133 mmol/L — AB (ref 135–145)
TOTAL PROTEIN: 5.3 g/dL — AB (ref 6.5–8.1)
Total Bilirubin: 0.7 mg/dL (ref 0.3–1.2)

## 2016-11-15 LAB — TROPONIN I: Troponin I: 0.04 ng/mL (ref <0.03)

## 2016-11-15 LAB — POCT I-STAT, CHEM 8
BUN: 8 mg/dL (ref 6–20)
CALCIUM ION: 1.06 mmol/L — AB (ref 1.15–1.40)
CHLORIDE: 104 mmol/L (ref 101–111)
Creatinine, Ser: 1 mg/dL (ref 0.44–1.00)
GLUCOSE: 115 mg/dL — AB (ref 65–99)
HCT: 34 % — ABNORMAL LOW (ref 36.0–46.0)
Hemoglobin: 11.6 g/dL — ABNORMAL LOW (ref 12.0–15.0)
Potassium: 3.3 mmol/L — ABNORMAL LOW (ref 3.5–5.1)
Sodium: 138 mmol/L (ref 135–145)
TCO2: 22 mmol/L (ref 0–100)

## 2016-11-15 LAB — APTT: aPTT: >200 seconds (ref 24–36)

## 2016-11-15 LAB — POCT ACTIVATED CLOTTING TIME
Activated Clotting Time: 235 seconds
Activated Clotting Time: 356 seconds

## 2016-11-15 LAB — MRSA PCR SCREENING: MRSA by PCR: NEGATIVE

## 2016-11-15 SURGERY — LEFT HEART CATH AND CORONARY ANGIOGRAPHY
Anesthesia: LOCAL

## 2016-11-15 MED ORDER — CIPROFLOXACIN HCL 500 MG PO TABS
500.0000 mg | ORAL_TABLET | Freq: Two times a day (BID) | ORAL | Status: DC
  Administered 2016-11-15 – 2016-11-17 (×4): 500 mg via ORAL
  Filled 2016-11-15 (×4): qty 1

## 2016-11-15 MED ORDER — ENALAPRIL MALEATE 10 MG PO TABS
20.0000 mg | ORAL_TABLET | Freq: Every day | ORAL | Status: DC
  Administered 2016-11-15 – 2016-11-17 (×2): 20 mg via ORAL
  Filled 2016-11-15 (×2): qty 1
  Filled 2016-11-15: qty 2

## 2016-11-15 MED ORDER — ATORVASTATIN CALCIUM 80 MG PO TABS
80.0000 mg | ORAL_TABLET | Freq: Every day | ORAL | Status: DC
  Administered 2016-11-16: 80 mg via ORAL
  Filled 2016-11-15 (×2): qty 1

## 2016-11-15 MED ORDER — MIDAZOLAM HCL 2 MG/2ML IJ SOLN
INTRAMUSCULAR | Status: DC | PRN
  Administered 2016-11-15: 1 mg via INTRAVENOUS

## 2016-11-15 MED ORDER — HEPARIN SODIUM (PORCINE) 5000 UNIT/ML IJ SOLN
5000.0000 [IU] | Freq: Three times a day (TID) | INTRAMUSCULAR | Status: DC
  Administered 2016-11-15 – 2016-11-17 (×5): 5000 [IU] via SUBCUTANEOUS
  Filled 2016-11-15 (×5): qty 1

## 2016-11-15 MED ORDER — CLOPIDOGREL BISULFATE 75 MG PO TABS
75.0000 mg | ORAL_TABLET | Freq: Every day | ORAL | Status: DC
  Administered 2016-11-16 – 2016-11-17 (×2): 75 mg via ORAL
  Filled 2016-11-15 (×2): qty 1

## 2016-11-15 MED ORDER — SODIUM CHLORIDE 0.9% FLUSH
3.0000 mL | Freq: Two times a day (BID) | INTRAVENOUS | Status: DC
  Administered 2016-11-15: 10 mL via INTRAVENOUS
  Administered 2016-11-16 (×2): 3 mL via INTRAVENOUS

## 2016-11-15 MED ORDER — TIROFIBAN HCL IV 12.5 MG/250 ML: INTRAVENOUS | Status: DC | PRN

## 2016-11-15 MED ORDER — SODIUM CHLORIDE 0.9% FLUSH
3.0000 mL | INTRAVENOUS | Status: DC | PRN
  Administered 2016-11-16: 3 mL via INTRAVENOUS
  Filled 2016-11-15: qty 3

## 2016-11-15 MED ORDER — NITROGLYCERIN 1 MG/10 ML FOR IR/CATH LAB
INTRA_ARTERIAL | Status: DC | PRN
  Administered 2016-11-15: 200 ug via INTRACORONARY

## 2016-11-15 MED ORDER — VERAPAMIL HCL 2.5 MG/ML IV SOLN
INTRAVENOUS | Status: DC | PRN
  Administered 2016-11-15: 10 mL via INTRA_ARTERIAL

## 2016-11-15 MED ORDER — CLOPIDOGREL BISULFATE 300 MG PO TABS
ORAL_TABLET | ORAL | Status: DC | PRN
  Administered 2016-11-15: 600 mg via ORAL

## 2016-11-15 MED ORDER — POTASSIUM CHLORIDE CRYS ER 20 MEQ PO TBCR
40.0000 meq | EXTENDED_RELEASE_TABLET | ORAL | Status: AC
  Administered 2016-11-15 (×2): 40 meq via ORAL
  Filled 2016-11-15 (×2): qty 2

## 2016-11-15 MED ORDER — LIDOCAINE HCL (PF) 1 % IJ SOLN
INTRAMUSCULAR | Status: DC | PRN
  Administered 2016-11-15: 3 mL

## 2016-11-15 MED ORDER — ASPIRIN 81 MG PO CHEW
81.0000 mg | CHEWABLE_TABLET | Freq: Every day | ORAL | Status: DC
  Administered 2016-11-16 – 2016-11-17 (×2): 81 mg via ORAL
  Filled 2016-11-15 (×2): qty 1

## 2016-11-15 MED ORDER — HEPARIN SODIUM (PORCINE) 1000 UNIT/ML IJ SOLN
INTRAMUSCULAR | Status: DC | PRN
  Administered 2016-11-15: 4000 [IU] via INTRAVENOUS
  Administered 2016-11-15: 2500 [IU] via INTRAVENOUS
  Administered 2016-11-15: 4000 [IU] via INTRAVENOUS

## 2016-11-15 MED ORDER — METOPROLOL TARTRATE 12.5 MG HALF TABLET
12.5000 mg | ORAL_TABLET | Freq: Two times a day (BID) | ORAL | Status: DC
  Administered 2016-11-15 – 2016-11-17 (×4): 12.5 mg via ORAL
  Filled 2016-11-15 (×4): qty 1

## 2016-11-15 MED ORDER — METRONIDAZOLE 500 MG PO TABS
500.0000 mg | ORAL_TABLET | Freq: Two times a day (BID) | ORAL | Status: DC
  Administered 2016-11-15 – 2016-11-17 (×4): 500 mg via ORAL
  Filled 2016-11-15 (×4): qty 1

## 2016-11-15 MED ORDER — SODIUM CHLORIDE 0.9 % IV SOLN: 250.0000 mL | INTRAVENOUS | Status: DC | PRN

## 2016-11-15 MED ORDER — TIROFIBAN (AGGRASTAT) BOLUS VIA INFUSION
INTRAVENOUS | Status: DC | PRN
  Administered 2016-11-15: 1475 ug via INTRAVENOUS

## 2016-11-15 MED ORDER — ZOLPIDEM TARTRATE 5 MG PO TABS
5.0000 mg | ORAL_TABLET | Freq: Every evening | ORAL | Status: DC | PRN
  Administered 2016-11-15 – 2016-11-16 (×2): 5 mg via ORAL
  Filled 2016-11-15 (×2): qty 1

## 2016-11-15 MED ORDER — ONDANSETRON HCL 4 MG/2ML IJ SOLN: 4.0000 mg | Freq: Four times a day (QID) | INTRAMUSCULAR | Status: DC | PRN

## 2016-11-15 MED ORDER — IOPAMIDOL (ISOVUE-370) INJECTION 76%
INTRAVENOUS | Status: DC | PRN
  Administered 2016-11-15: 150 mL via INTRA_ARTERIAL

## 2016-11-15 MED ORDER — SODIUM CHLORIDE 0.9 % IV SOLN: INTRAVENOUS | Status: AC

## 2016-11-15 SURGICAL SUPPLY — 18 items
BALLN EMERGE MR 2.5X8 (BALLOONS) ×2
BALLOON EMERGE MR 2.5X8 (BALLOONS) ×1 IMPLANT
CATH INFINITI 5 FR JL3.5 (CATHETERS) ×2 IMPLANT
CATH INFINITI JR4 5F (CATHETERS) ×2 IMPLANT
CATH LAUNCHER 6FR JR4 (CATHETERS) ×2 IMPLANT
DEVICE RAD COMP TR BAND LRG (VASCULAR PRODUCTS) ×2 IMPLANT
ELECT DEFIB PAD ADLT CADENCE (PAD) ×2 IMPLANT
GLIDESHEATH SLEND A-KIT 6F 22G (SHEATH) ×2 IMPLANT
GUIDEWIRE INQWIRE 1.5J.035X260 (WIRE) ×1 IMPLANT
INQWIRE 1.5J .035X260CM (WIRE) ×2
KIT ENCORE 26 ADVANTAGE (KITS) ×2 IMPLANT
KIT HEART LEFT (KITS) ×2 IMPLANT
PACK CARDIAC CATHETERIZATION (CUSTOM PROCEDURE TRAY) ×2 IMPLANT
STENT SYNERGY DES 2.75X12 (Permanent Stent) ×2 IMPLANT
TRANSDUCER W/STOPCOCK (MISCELLANEOUS) ×2 IMPLANT
TUBING CIL FLEX 10 FLL-RA (TUBING) ×2 IMPLANT
WIRE ASAHI PROWATER 180CM (WIRE) ×2 IMPLANT
WIRE HI TORQ VERSACORE-J 145CM (WIRE) ×2 IMPLANT

## 2016-11-15 NOTE — H&P (Signed)
Mackenzie Richards is a 76 y.o. 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 She has prior history of LAD and right coronary stenting. She had coronary angiography at [facility] in 2005 revealed nonobstructive CAD. She presented with unstable angina in October 2010 to [facility] with unstable angina and had stenting of the mid LAD (2.5 x 12 Xience V). It is unclear when/where the right coronary stents were placed.  She experienced severe substernal chest discomfort at around 3:15 today. Because of ongoing discomfort in the husband called EMS. The transmitted EKG from EMS at 1550 revealed inferolateral ST elevation with reciprocal change. This led to stimulate activation. The patient was met in the emergency department. Upon arrival EMS informed us that 2 sublingual nitroglycerin tablets during transportation lead to resolution of discomfort. A repeat EKG done in the emergency department revealed complete resolution of ST segment abnormality. With known coronary disease she was brought to the catheterization laboratory for urgent evaluation despite normalization of EKG.  The patient's speech is somewhat slurred during the history. She also seems to have some difficulty with hearing and retrieving historical data.  PMH:    Past Medical History:  Diagnosis Date  . Asthma   . Bowen's disease   . Breast cyst   . Coronary artery disease   . Diverticulitis   . DVT (deep venous thrombosis) ([place])   . Frequent UTI   . Hypertension   . Kidney cysts     PSH:    Past Surgical History:  Procedure Laterality Date  . BILATERAL KNEE ARTHROSCOPY     . EYE SURGERY    . TUBAL LIGATION     ALLERGIES:   Biaxin [clarithromycin]; Fentanyl; Hydrochlorothiazide; Lasix [furosemide]; Levaquin [levofloxacin in d5w]; Penicillins; Percocet [oxycodone-acetaminophen]; Sulfa antibiotics; and Zyban [bupropion] Prior to Admit Meds:   Prescriptions Prior to Admission  Medication Sig Dispense Refill Last Dose  . aspirin 325 MG tablet Take 325 mg by mouth daily.   03/18/2015 at Unknown time  . cefdinir (OMNICEF) 300 MG capsule Take 1 capsule (300 mg total) by mouth 2 (two) times daily. (Patient not taking: Reported on 03/18/2015) 20 capsule 0 Completed Course at Unknown time  . chlorpheniramine-HYDROcodone (TUSSIONEX) 10-8 MG/5ML [facility] Take 5 mLs by mouth every 12 (twelve) hours as needed for cough. (Patient not taking: Reported on 03/18/2015) 140 mL 0 Completed Course at Unknown time  . ciprofloxacin (CIPRO) 500 MG tablet Take 1 tablet (500 mg total) by mouth 2 (two) times daily. One po bid x 7 days 14 tablet 0   . doxycycline (VIBRA-TABS) 100 MG tablet Take 1 tablet (100 mg total) by mouth 2 (two) times daily. (Patient not taking: Reported on 03/18/2015) 20 tablet 0 Completed Course at Unknown time  . enalapril (VASOTEC) 20 MG tablet Take 20 mg by mouth daily.   03/18/2015 at Unknown time  . metroNIDAZOLE (FLAGYL) 500 MG tablet Take 1 tablet (500 mg total) by mouth 2 (two) times daily. 14 tablet 0   . potassium chloride SA (K-DUR,KLOR-CON) 20 MEQ tablet Take 1 tablet (20 mEq total) by mouth daily. 3 tablet 0   . predniSONE (DELTASONE) 20 MG tablet Take 3 daily for 5 days,  2 daily for 5 days, 1 daily for 5 days. (Patient not taking: Reported on 03/18/2015) 5 tablet 0 Completed Course at Unknown time  . simvastatin (ZOCOR) 80 MG tablet Take 80 mg by mouth at bedtime.   03/17/2015 at Unknown time  . spironolactone (ALDACTONE) 25 MG tablet Take 25 mg by mouth daily.   03/18/2015 at Unknown time  . zolpidem (AMBIEN) 5 MG tablet Take 5 mg by mouth at bedtime.     03/17/2015 at Unknown time   Family HX:   No family history on file. Social HX:    Social History   Social History  . Marital status: Married    Spouse name: N/A  . Number of children: N/A  . Years of education: N/A   Occupational History  . Not on file.   Social History Main Topics  . Smoking status: Current Every Day Smoker    Packs/day: 1.00    Types: Cigarettes  . Smokeless tobacco: Not on file  . Alcohol use No  . Drug use: No  . Sexual activity: Not on file   Other Topics Concern  . Not on file   Social History Narrative  . No narrative on file     ROS Decreased hearing, difficulty with memory, history of migraine headaches, prior history of amaurosis fugax with subsequent mild aphasia. Recent prolonged episode of diarrhea prompting [facility] emergency room visit 48 hours ago at which time Flagyl and ciprofloxacin were started. Appetite is been poor. She denies chills and fever. All other systems are negative.  Physical Exam: Blood pressure 138/71, pulse 71, resp. rate (!) 22, SpO2 99 %.    Upon arrival the patient is lying flat on the ambulance gurney. She is in no distress stress. She denies chest pain. HEENT exam reveals pupils equal and reactive. Neck exam reveals no JVD or carotid bruits. Chest is clear anteriorly and at the posterior bases bilaterally. Cardiac exam reveals no rub, murmur, click, or gallop. Abdomen is soft. Bowel sounds normal. Extremities reveal no edema. Extremities reveal no edema. Femoral pulses and radial pulses are 2+ and symmetric. Neurological exam reveals slight facial asymmetry with drooping of the right side of the mouth. There is some slurring of the speech (later informed by the husband that this is chronic over the past 2 years). Decreased memory Psychiatric reveals decreased memory.  Labs: Lab Results  Component Value Date   WBC 11.1 (H) 11/15/2016   HGB 11.1 (L) 11/15/2016   HCT 34.4 (L) 11/15/2016   MCV 98.6 11/15/2016    PLT 342 11/15/2016    Recent Labs Lab 11/15/16 1720  NA 133*  K 3.2*  CL 105  CO2 20*  BUN 8  CREATININE 1.03*  CALCIUM 7.5*  PROT 5.3*  BILITOT 0.7  ALKPHOS 77  ALT 10*  AST 16  GLUCOSE 114*   Lab Results  Component Value Date   TROPONINI 0.04 ([place]) 11/15/2016     Radiology:  No results found.  EKG:  EKG transmitted from EMS reveals sinus bradycardia with 2 mm of ST elevation in II, III, aVF, V5 and V6. EKG upon arrival at [facility] revealed resolution of ST elevation.  ASSESSMENT:  1. Aborted inferior ST elevation myocardial infarction with resolution of symptoms upon arrival in the emergency room. 2. Coronary artery disease with history of LAD and right coronary stents. LAD stent was placed in 2010. Uncertain about the timing of the right coronary stent. 3. Decreased memory 4. Slurred speech,  chronic over the past 2 years according to the husband. 5. Medication noncompliance: Not currently on antiplatelet therapy, statin, and cannot remember her blood pressure medication. 6. Essential hypertension  Plan:  1. Urgent catheterization as it may be impending stent thrombosis or new disease in the right coronary is circumflex that has caused at least 30-40 minutes of prolonged ischemic pain with associated ST segment elevation in the inferior leads. 2. Will try to obtain further information from her other health. Providers to fill in details that are not currently clear.  The patient was counseled to undergo left heart catheterization, coronary angiography, and possible percutaneous coronary intervention with stent implantation. The procedural risks and benefits were discussed in detail. The risks discussed included death, stroke, myocardial infarction, life-threatening bleeding, limb ischemia, kidney injury, allergy, and possible emergency cardiac surgery. The risk of these significant complications were estimated to occur less than 1% of the time. After  discussion, the patient has agreed to proceed.  Critical care time 45 minutes  Belva Crome III 11/15/2016 7:19 PM

## 2016-11-16 ENCOUNTER — Encounter (HOSPITAL_COMMUNITY): Payer: Self-pay | Admitting: Interventional Cardiology

## 2016-11-16 DIAGNOSIS — F17200 Nicotine dependence, unspecified, uncomplicated: Secondary | ICD-10-CM

## 2016-11-16 LAB — HEMOGLOBIN A1C
Hgb A1c MFr Bld: 6.1 % — ABNORMAL HIGH (ref 4.8–5.6)
Mean Plasma Glucose: 128 mg/dL

## 2016-11-16 LAB — BASIC METABOLIC PANEL
Anion gap: 6 (ref 5–15)
BUN: 7 mg/dL (ref 6–20)
CHLORIDE: 108 mmol/L (ref 101–111)
CO2: 20 mmol/L — ABNORMAL LOW (ref 22–32)
CREATININE: 0.89 mg/dL (ref 0.44–1.00)
Calcium: 8.1 mg/dL — ABNORMAL LOW (ref 8.9–10.3)
GFR calc non Af Amer: >60 mL/min (ref >60)
Glucose, Bld: 112 mg/dL — ABNORMAL HIGH (ref 65–99)
Potassium: 5.2 mmol/L — ABNORMAL HIGH (ref 3.5–5.1)
SODIUM: 134 mmol/L — AB (ref 135–145)

## 2016-11-16 LAB — TROPONIN I
TROPONIN I: 0.11 ng/mL — AB (ref <0.03)
Troponin I: 0.1 ng/mL (ref <0.03)

## 2016-11-16 LAB — CBC
HCT: 36.6 % (ref 36.0–46.0)
HEMOGLOBIN: 11.9 g/dL — AB (ref 12.0–15.0)
MCH: 32.2 pg (ref 26.0–34.0)
MCHC: 32.5 g/dL (ref 30.0–36.0)
MCV: 99.2 fL (ref 78.0–100.0)
Platelets: 341 10*3/uL (ref 150–400)
RBC: 3.69 MIL/uL — ABNORMAL LOW (ref 3.87–5.11)
RDW: 13.5 % (ref 11.5–15.5)
WBC: 11 10*3/uL — ABNORMAL HIGH (ref 4.0–10.5)

## 2016-11-16 MED ORDER — POTASSIUM CHLORIDE CRYS ER 20 MEQ PO TBCR
40.0000 meq | EXTENDED_RELEASE_TABLET | Freq: Once | ORAL | Status: DC
  Filled 2016-11-16: qty 2

## 2016-11-16 NOTE — Care Management Note (Signed)
Case Management Note Donn Pierini RN, BSN Unit 2W-Case Manager-- 2H coverage [phone]  Patient Details  Name: Mackenzie Richards MRN: [id number] Date of Birth: 1941-01-06  Subjective/Objective:  Pt admitted with Aborted inferior ST elevation myocardial infarction- normalized EKG on arrival to ED- brought to the catheterization laboratory for urgent evaluation despite normalization of EKG.                   Action/Plan: PTA pt lived at home with spouse- anticipate return home with spouse- CM will follow   Expected Discharge Date:                  Expected Discharge Plan:  Home/Self Care  In-House Referral:     Discharge planning Services  CM Consult  Post Acute Care Choice:    Choice offered to:     DME Arranged:    DME Agency:     HH Arranged:    HH Agency:     Status of Service:  In process, will continue to follow  If discussed at Long Length of Stay Meetings, dates discussed:    Discharge Disposition:   Additional Comments:  Darrold Span, RN 11/16/2016, 9:54 AM

## 2016-11-16 NOTE — Progress Notes (Signed)
Pt arrived to 2w from 2h. Pt oriented to room and staff. Telemetry box applied and CCMD notified. Vitals obtained. Pt denies needs at this time. Will continue current plan of care.  Kayleanna Lorman Moffitt BSN, RN 

## 2016-11-16 NOTE — Progress Notes (Addendum)
Progress Note  Patient Name: Mackenzie Richards Date of Encounter: 11/16/2016  Primary Cardiologist: Winifred Olive, [facility]  Subjective   No recurrent chest pain  Inpatient Medications    Scheduled Meds: . aspirin  81 mg Oral Daily  . atorvastatin  80 mg Oral q1800  . ciprofloxacin  500 mg Oral BID  . clopidogrel  75 mg Oral Q breakfast  . enalapril  20 mg Oral Daily  . heparin  5,000 Units Subcutaneous Q8H  . metoprolol tartrate  12.5 mg Oral BID  . metroNIDAZOLE  500 mg Oral Q12H  . potassium chloride  40 mEq Oral Once  . sodium chloride flush  3 mL Intravenous Q12H   Continuous Infusions: . sodium chloride     PRN Meds: sodium chloride, ondansetron (ZOFRAN) IV, sodium chloride flush, zolpidem   Vital Signs    Vitals:   11/16/16 0756 11/16/16 1100 11/16/16 1200 11/16/16 1207  BP: (!) 150/81   128/71  Pulse: 71 60 (!) 57 (!) 58  Resp: 20 19 15  (!) 30  Temp:  98.1 F (36.7 C)    TempSrc:      SpO2: 96% 99% 97% 97%  Weight:      Height:        Intake/Output Summary (Last 24 hours) at 11/16/16 1342 Last data filed at 11/16/16 0400  Gross per 24 hour  Intake             1625 ml  Output              350 ml  Net             1275 ml    I/O since admission: +1275  [facility] Weights   11/16/16 0436  Weight: 134 lb 7.7 oz (61 kg)    Telemetry    Sinus 62- Personally Reviewed  ECG    ECG (independently read by me): NSR 65; T wave inversion in V3  Physical Exam   BP 128/71 (BP Location: Left Arm)   Pulse (!) 58   Temp 98.1 F (36.7 C)   Resp (!) 30   Ht 5\' 2"  (1.575 m)   Wt 134 lb 7.7 oz (61 kg)   SpO2 97%   BMI 24.60 kg/m  General: Alert, oriented, no distress.  Skin: normal turgor, no rashes, warm and dry HEENT: Normocephalic, atraumatic. Pupils equal round and reactive to light; sclera anicteric; extraocular muscles intact;  Nose without nasal septal hypertrophy Mouth/Parynx benign; Mallinpatti scale 3 Neck: No JVD, no carotid bruits; normal  carotid upstroke Lungs: diffusely decreased BS Chest wall: without tenderness to palpitation Heart: PMI not displaced, RRR, s1 s2 normal, 1/6 systolic murmur, no diastolic murmur, no rubs, gallops, thrills, or heaves Abdomen: soft, nontender; no hepatosplenomehaly, BS+; abdominal aorta nontender and not dilated by palpation. Back: no CVA tenderness Pulses 2+ R radial cath site stable Musculoskeletal: full range of motion, normal strength, no joint deformities Extremities: no clubbing cyanosis or edema, Homan's sign negative  Neurologic: grossly nonfocal; Cranial nerves grossly wnl Psychologic: Normal mood and affect   Labs    Chemistry Recent Labs Lab 11/13/16 1745 11/15/16 1641 11/15/16 1720 11/16/16 0837  NA 132* 138 133* 134*  K 2.9* 3.3* 3.2* 5.2*  CL 92* 104 105 108  CO2 25  --  20* 20*  GLUCOSE 107* 115* 114* 112*  BUN 16 8 8 7   CREATININE 1.08* 1.00 1.03* 0.89  CALCIUM 8.3*  --  7.5* 8.1*  PROT 7.0  --  5.3*  --   ALBUMIN 3.4*  --  2.5*  --   AST 22  --  16  --   ALT 14  --  10*  --   ALKPHOS 113  --  77  --   BILITOT 0.8  --  0.7  --   GFRNONAA 49*  --  52* >60  GFRAA 57*  --  >60 >60  ANIONGAP 15  --  8 6     Hematology Recent Labs Lab 11/13/16 1745 11/15/16 1641 11/15/16 1720 11/16/16 0326  WBC 10.3  --  11.1* 11.0*  RBC 4.11  --  3.49* 3.69*  HGB 13.7 11.6* 11.1* 11.9*  HCT 40.1 34.0* 34.4* 36.6  MCV 97.6  --  98.6 99.2  MCH 33.3  --  31.8 32.2  MCHC 34.2  --  32.3 32.5  RDW 13.3  --  13.5 13.5  PLT 312  --  342 341    Cardiac Enzymes Recent Labs Lab 11/15/16 1720 11/16/16 1151  TROPONINI 0.04* 0.11*   No results for input(s): TROPIPOC in the last 168 hours.   BNPNo results for input(s): BNP, PROBNP in the last 168 hours.   DDimer No results for input(s): DDIMER in the last 168 hours.   Lipid Panel     Component Value Date/Time   CHOL 144 11/15/2016 1720   TRIG 106 11/15/2016 1720   HDL 23 (L) 11/15/2016 1720   CHOLHDL 6.3  11/15/2016 1720   VLDL 21 11/15/2016 1720   LDLCALC 100 (H) 11/15/2016 1720    Radiology    No results found.  Cardiac Studies   Urgent cardiac Cath Conclusion    Subtotal occlusion of the distal circumflex with a hazy appearance compatible with spontaneous reperfusion and aborted inferior ST elevation myocardial infarction.  Successful PTCA and stent implantation in the distal right coronary reducing the 99% stenosis to 0% with TIMI grade 3 flow using a 2.75 mm Synergy DES.  Proximal to mid LAD in-stent restenosis 75-80%. The obstruction extends beyond the proximal margin of the stent.  Widely patent circumflex.  Normal left ventricular systolic function. EF 60%.   RECOMMENDATIONS:   Aspirin and Plavix indefinitely  Will need PCI on the proximal to mid LAD ISR. She would like to have this performed through her cardiologist at [facility].  Risk factor modification, resuming statin therapy, antihypertensive therapy.  Possibly old for discharge tomorrow but need to have specific plans to follow-up with her cardiologist and have staged LAD PCI.             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  Assessment & Plan    1. NSTEMI: s/p PCI to distal RCA as above.  Will need staged PCI to proximal LAD. She wishes this be done at [facility]. No recurrent cp. Now on ASA/Plavix.  2. CAD: remote h/o LAD and RCA stents.  3. HTN: she has refused her enalapril due to some side effects. Will change to ARB with losartan 50 mg and titrate if needed.BP now 170/110 will add amlodipine.  4. HLP: now on atorvastatin 80 mg  5.  Tobacco abuse: smoking cessation discussed.  Will transfer to telemetry; ? DC tomorrow if stable with F/U [facility] with Dr. Cory Roughen for staged LAD PCI.  Signed, Lennette Bihari, MD, [facility] 11/16/2016, 1:42 PM

## 2016-11-17 LAB — BASIC METABOLIC PANEL
ANION GAP: 9 (ref 5–15)
BUN: 10 mg/dL (ref 6–20)
CALCIUM: 7.8 mg/dL — AB (ref 8.9–10.3)
CHLORIDE: 105 mmol/L (ref 101–111)
CO2: 20 mmol/L — AB (ref 22–32)
Creatinine, Ser: 1.03 mg/dL — ABNORMAL HIGH (ref 0.44–1.00)
GFR calc non Af Amer: 52 mL/min — ABNORMAL LOW (ref >60)
Glucose, Bld: 91 mg/dL (ref 65–99)
Potassium: 4 mmol/L (ref 3.5–5.1)
SODIUM: 134 mmol/L — AB (ref 135–145)

## 2016-11-17 LAB — TROPONIN I: Troponin I: 0.09 ng/mL (ref <0.03)

## 2016-11-17 MED ORDER — NITROGLYCERIN 0.4 MG SL SUBL: 0.4000 mg | SUBLINGUAL_TABLET | SUBLINGUAL | 2 refills | Status: AC | PRN

## 2016-11-17 MED ORDER — CLOPIDOGREL BISULFATE 75 MG PO TABS: 75.0000 mg | ORAL_TABLET | Freq: Every day | ORAL | 6 refills | Status: DC

## 2016-11-17 MED ORDER — ENALAPRIL MALEATE 20 MG PO TABS: 20.0000 mg | ORAL_TABLET | Freq: Every day | ORAL | 2 refills | Status: DC

## 2016-11-17 MED ORDER — ATORVASTATIN CALCIUM 80 MG PO TABS: 80.0000 mg | ORAL_TABLET | Freq: Every day | ORAL | 1 refills | Status: DC

## 2016-11-17 MED ORDER — ASPIRIN 81 MG PO CHEW: 81.0000 mg | CHEWABLE_TABLET | Freq: Every day | ORAL | Status: DC

## 2016-11-17 MED ORDER — METOPROLOL TARTRATE 25 MG PO TABS: 12.5000 mg | ORAL_TABLET | Freq: Two times a day (BID) | ORAL | 2 refills | Status: DC

## 2016-11-17 MED FILL — Tirofiban HCl in NaCl 0.9% IV Soln 5 MG/100ML (Base Equiv): INTRAVENOUS | Qty: 100 | Status: AC

## 2016-11-17 NOTE — Discharge Summary (Signed)
Discharge Summary    Patient ID: Mackenzie Richards,  MRN: [id number], DOB/AGE: 1941-04-22 76 y.o.  Admit date: 11/15/2016 Discharge date: 11/17/2016  Primary Care Provider: Vivia Birmingham Primary Cardiologist: Rich Reining [facility])  Discharge Diagnoses    Active Problems:   Acute ST elevation myocardial infarction (STEMI) of inferior wall [facility])   Aborted myocardial infarction [facility])   Aphasia   Amaurosis fugax, both eyes   Asthma   Dyslipidemia   History of coronary artery stent placement (2009/2010)   Heavy smoker   Allergies Allergies  Allergen Reactions  . Biaxin [Clarithromycin] Other (See Comments)    Unknown  . Enalapril Itching and Other (See Comments)    Pt states she gets hyper  . Fentanyl Other (See Comments)    Unknown  . Hydrochlorothiazide Other (See Comments)    Unknown  . Lasix [Furosemide] Other (See Comments)    Unknown  . Levaquin [Levofloxacin In D5w] Other (See Comments)    Unknown  . Penicillins Other (See Comments)    Unknown  . Percocet [Oxycodone-Acetaminophen] Other (See Comments)    Unknown  . Sulfa Antibiotics Other (See Comments)    Unknown  . Zyban [Bupropion] Other (See Comments)    Unknown    Diagnostic Studies/Procedures    LHC: 11/15/16  Conclusion    Subtotal occlusion of the distal circumflex with a hazy appearance compatible with spontaneous reperfusion and aborted inferior ST elevation myocardial infarction.  Successful PTCA and stent implantation in the distal right coronary reducing the 99% stenosis to 0% with TIMI grade 3 flow using a 2.75 mm Synergy DES.  Proximal to mid LAD in-stent restenosis 75-80%. The obstruction extends beyond the proximal margin of the stent.  Widely patent circumflex.  Normal left ventricular systolic function. EF 60%.   RECOMMENDATIONS:   Aspirin and Plavix indefinitely  Will need PCI on the proximal to mid LAD ISR. She would like to have this performed through her cardiologist at  [facility]  Risk factor modification, resuming statin therapy, antihypertensive therapy.  Possibly old for discharge tomorrow but need to have specific plans to follow-up with her cardiologist and have staged LAD PCI.  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 She has prior history of LAD and right coronary stenting. She had coronary angiography at [facility] in 2005 revealed nonobstructive CAD. She presented with unstable angina in October 2010 to [facility] with unstable angina and had stenting of the mid LAD (2.5 x 12 Xience V). It is unclear when/where the right coronary stents were placed.  She experienced severe substernal chest discomfort at around 3:15 pm on the day of admission. Because of ongoing discomfort her husband called EMS. The transmitted EKG from EMS at 1550 revealed inferolateral ST elevation with reciprocal change. This led to STEMI activation. The patient was met in the emergency department. Upon arrival EMS informed us that 2 sublingual nitroglycerin tablets during transportation lead to resolution of discomfort. A repeat EKG done in the emergency department revealed complete resolution of ST segment abnormality. With known coronary disease she was brought to the catheterization laboratory for urgent evaluation despite normalization of EKG.  Hospital Course     Underwent cardiac cath with Dr. Tamala Julian, noted above that showed ISR 80% of mid/proximal LAD. This was not intervened  on per the patients request to have this corrected at [facility]. Did have DES placed to dRCA. Will plan for DAPT with ASA/plavix indefinitely. Labs were stable post cath. Troponin peaked 0.11. She was transferred to telemetry on 11/15/16. She was seen by Dr. Claiborne Billings and reported that  she would like to follow up at [facility] as planned to have LAD lesion treated. No further chest pain reported.   She was seen by Dr. Claiborne Billings and determined stable for discharge home. Follow up will be arranged by the patient at [facility] with her primary cardiologist. Medications are listed below.    General: Well developed, well nourished, female appearing in no acute distress. Head: Normocephalic, atraumatic.  Neck: Supple without bruits, JVD. Lungs:  Resp regular and unlabored, CTA. Heart: RRR, S1, S2, no S3, S4, or murmur; no rub. Abdomen: Soft, non-tender, non-distended with normoactive bowel sounds. No hepatomegaly. No rebound/guarding. No obvious abdominal masses. Extremities: No clubbing, cyanosis, edema. Distal pedal pulses are 2+ bilaterally. R cath site stable without bruising or hematoma Neuro: Alert and oriented X 3. Moves all extremities spontaneously. Psych: Normal affect.  _____________  Discharge Vitals Blood pressure (!) 112/53, pulse 65, temperature 98.9 F (37.2 C), temperature source Oral, resp. rate 20, height '5\' 2"'  (1.575 m), weight 136 lb 3.2 oz (61.8 kg), SpO2 95 %.  Filed Weights   11/16/16 0436 11/16/16 1759  Weight: 134 lb 7.7 oz (61 kg) 136 lb 3.2 oz (61.8 kg)    Labs & Radiologic Studies    CBC  Recent Labs  11/15/16 1720 11/16/16 0326  WBC 11.1* 11.0*  HGB 11.1* 11.9*  HCT 34.4* 36.6  MCV 98.6 99.2  PLT 342 938   Basic Metabolic Panel  Recent Labs  11/16/16 0837 11/17/16 0240  NA 134* 134*  K 5.2* 4.0  CL 108 105  CO2 20* 20*  GLUCOSE 112* 91  BUN 7 10  CREATININE 0.89 1.03*  CALCIUM 8.1* 7.8*   Liver Function Tests  Recent Labs  11/15/16 1720  AST 16  ALT 10*  ALKPHOS 77  BILITOT 0.7  PROT 5.3*  ALBUMIN 2.5*   No results for input(s): LIPASE, AMYLASE in the last 72 hours. Cardiac Enzymes  Recent Labs  11/16/16 1151 11/16/16 1831 11/16/16 2326  TROPONINI 0.11* 0.10* 0.09*   BNP Invalid input(s): POCBNP D-Dimer No  results for input(s): DDIMER in the last 72 hours. Hemoglobin A1C  Recent Labs  11/15/16 1720  HGBA1C 6.1*   Fasting Lipid Panel  Recent Labs  11/15/16 1720  CHOL 144  HDL 23*  LDLCALC 100*  TRIG 106  CHOLHDL 6.3   Thyroid Function Tests No results for input(s): TSH, T4TOTAL, T3FREE, THYROIDAB in the last 72 hours.  Invalid input(s): FREET3 _____________  Ct Abdomen Pelvis W Contrast  Result Date: 11/13/2016 CLINICAL DATA:  Numerous watery stools per day starting 9 days ago. Diffuse abdominal pain. EXAM: CT ABDOMEN AND PELVIS WITH CONTRAST TECHNIQUE: Multidetector CT imaging of the abdomen and pelvis was performed using the standard protocol following bolus administration of intravenous contrast. CONTRAST:  80 cc ISOVUE-300 IOPAMIDOL (ISOVUE-300) INJECTION 61% COMPARISON:  06/04/2008 FINDINGS: Lower chest: The visualized cardiac chambers are unremarkable and normal in size. There is no pericardial effusion. There is coronary arteriosclerosis with aortic atherosclerosis. Dependent atelectasis is seen at each lung base. No pneumonic consolidation, effusion or pneumothorax. Hepatobiliary: Distended gallbladder possibly from a fasting state or sympathetic thickening from adjacent bowel inflammation. No gallstones. No choledocholithiasis. Fatty infiltration along the falciform ligament.  There is periportal edema. Pancreas: No pancreatic ductal dilatation, discrete mass or inflammation. Spleen: Normal in size without focal abnormality. Adrenals/Urinary Tract: Normal bilateral adrenal glands. 4 mm left upper pole nonobstructing renal calculus with renovascular calcifications at the hilum. Interval increase in conspicuity of bilateral renal cyst since prior exam most are too small to further characterize, the largest is on left measuring 1.5 cm and unchanged since 2009 consistent with a benign finding. No solid enhancing renal lesions are apparent. There is no hydroureteronephrosis. The urinary  bladder is unremarkable. Stomach/Bowel: Contracted stomach with normal small bowel rotation. Diffuse mural and mucosal thickening with inflammation throughout the entirety of the visualized large intestine to the level of the rectum consistent with diffuse proctocolitis. No mechanical source obstruction. Vascular/Lymphatic: Aortoiliac and bifemoral atherosclerosis without aneurysm. No enlarged lymph nodes. Reproductive: The uterus is unremarkable. No adnexal masses. Slight prominence periuterine vessels again noted without change. Query pelvic vascular congestion. Other: No abdominal wall hernia or abnormality. No abdominopelvic ascites. Musculoskeletal: Thoracolumbar spondylosis. No acute nor suspicious osseous lesions. Grade 1 anterolisthesis at L5-S1. Slight grade 1 retrolisthesis of L3 on L4 and L 2 on L3. IMPRESSION: 1. Diffuse proctocolitis. 2. Bilateral renal cysts most are too small to further characterize, slightly increased in number since 2009. 3. Left upper pole 4 mm nonobstructing renal calculus. 4. Aortoiliac and bifemoral atherosclerosis. Mild prominence of periuterine vessels as before which may represent pelvic vascular congestion. 5. Thoracolumbar spondylosis. 6. Distended gallbladder which could be due to a fasting state. No obstruction is apparent. Electronically Signed   By: Ashley Royalty M.D.   On: 11/13/2016 19:10   Disposition   Pt is being discharged home today in good condition.  Follow-up Plans & Appointments    Follow-up Information    Vivia Birmingham, MD Follow up.   Specialty:  Internal Medicine Why:  Please call Dr. Rich Reining to arrange for follow up and cardiac cath. Contact information: [address] [place] [address] [phone]          Discharge Instructions    Call MD for:  redness, tenderness, or signs of infection (pain, swelling, redness, odor or green/yellow discharge around incision site)    Complete by:  As directed    Diet - low sodium heart  healthy    Complete by:  As directed    Discharge instructions    Complete by:  As directed    Radial Site Care Refer to this sheet in the next few weeks. These instructions provide you with information on caring for yourself after your procedure. Your caregiver may also give you more specific instructions. Your treatment has been planned according to current medical practices, but problems sometimes occur. Call your caregiver if you have any problems or questions after your procedure. HOME CARE INSTRUCTIONS You may shower the day after the procedure.Remove the bandage (dressing) and gently wash the site with plain soap and water.Gently pat the site dry.  Do not apply powder or lotion to the site.  Do not submerge the affected site in water for 3 to 5 days.  Inspect the site at least twice daily.  Do not flex or bend the affected arm for 24 hours.  No lifting over 5 pounds (2.3 kg) for 5 days after your procedure.  Do not drive home if you are discharged the same day of the procedure. Have someone else drive you.  You may drive 24 hours after the procedure unless otherwise instructed by your caregiver.  What to  expect: Any bruising will usually fade within 1 to 2 weeks.  Blood that collects in the tissue (hematoma) may be painful to the touch. It should usually decrease in size and tenderness within 1 to 2 weeks.  SEEK IMMEDIATE MEDICAL CARE IF: You have unusual pain at the radial site.  You have redness, warmth, swelling, or pain at the radial site.  You have drainage (other than a small amount of blood on the dressing).  You have chills.  You have a fever or persistent symptoms for more than 72 hours.  You have a fever and your symptoms suddenly get worse.  Your arm becomes pale, cool, tingly, or numb.  You have heavy bleeding from the site. Hold pressure on the site.    PLEASE MAKE SURE YOU DO NOT MISS ANY DOSES OF YOUR PLAVIX!! We added metoprolol and lipitor to your home  medications this admission. Please make sure to follow up with your Cardiologist at [facility] for staged intervention.   Increase activity slowly    Complete by:  As directed       Discharge Medications   Current Discharge Medication List    START taking these medications   Details  aspirin 81 MG chewable tablet Chew 1 tablet (81 mg total) by mouth daily. Qty: 30 tablet    atorvastatin (LIPITOR) 80 MG tablet Take 1 tablet (80 mg total) by mouth daily at 6 PM. Qty: 30 tablet, Refills: 1    clopidogrel (PLAVIX) 75 MG tablet Take 1 tablet (75 mg total) by mouth daily with breakfast. Qty: 30 tablet, Refills: 6    metoprolol tartrate (LOPRESSOR) 25 MG tablet Take 0.5 tablets (12.5 mg total) by mouth 2 (two) times daily. Qty: 30 tablet, Refills: 2    nitroGLYCERIN (NITROSTAT) 0.4 MG SL tablet Place 1 tablet (0.4 mg total) under the tongue every 5 (five) minutes as needed. Qty: 25 tablet, Refills: 2      CONTINUE these medications which have CHANGED   Details  enalapril (VASOTEC) 20 MG tablet Take 1 tablet (20 mg total) by mouth daily. Qty: 30 tablet, Refills: 2      CONTINUE these medications which have NOT CHANGED   Details  acetaminophen (TYLENOL) 325 MG tablet Take 650 mg by mouth every 6 (six) hours as needed for moderate pain or headache.    ciprofloxacin (CIPRO) 500 MG tablet Take 1 tablet (500 mg total) by mouth 2 (two) times daily. One po bid x 7 days Qty: 14 tablet, Refills: 0    cloNIDine (CATAPRES) 0.1 MG tablet Take 0.1 mg by mouth See admin instructions. Take 0.1 mg by mouth daily. May take 0.1 mg additional if needed    metroNIDAZOLE (FLAGYL) 500 MG tablet Take 1 tablet (500 mg total) by mouth 2 (two) times daily. Qty: 14 tablet, Refills: 0    potassium chloride SA (K-DUR,KLOR-CON) 20 MEQ tablet Take 1 tablet (20 mEq total) by mouth daily. Qty: 3 tablet, Refills: 0    zolpidem (AMBIEN) 10 MG tablet Take 10 mg by mouth at bedtime.       STOP taking these  medications     cefdinir (OMNICEF) 300 MG capsule      chlorpheniramine-HYDROcodone (TUSSIONEX) 10-8 MG/5ML LQCR      doxycycline (VIBRA-TABS) 100 MG tablet      predniSONE (DELTASONE) 20 MG tablet          Aspirin prescribed at discharge?  Yes High Intensity Statin Prescribed? (Lipitor 40-2m or Crestor 20-450m: Yes Beta Blocker  Prescribed? Yes For EF <40%, was ACEI/ARB Prescribed? Yes ADP Receptor Inhibitor Prescribed? (i.e. Plavix etc.-Includes Medically Managed Patients): Yes For EF <40%, Aldosterone Inhibitor Prescribed? No: EFok Was EF assessed during THIS hospitalization? Yes Was Cardiac Rehab II ordered? (Included Medically managed Patients): No   Outstanding Labs/Studies   FLP/LFTs in 6 weeks.   Duration of Discharge Encounter   Greater than 30 minutes including physician time.  Signed, Reino Bellis NP-C 11/17/2016, 3:36 PM    Patient seen and examined. Agree with assessment and plan. No recurrent chest pain. BP improved with the addition of losartan and amlodipine. Plan for dc today. Pt will need staged PCI to LAD stenosis. At present she plans to f/u with Dr. Rich Reining at [facility].   Troy Sine, MD, [facility] 11/17/2016 3:36 PM

## 2016-11-17 NOTE — Progress Notes (Signed)
Order received to discharge patient.  Telemetry monitor removed and CCMD notified.  PIV access removed.  Discharge instructions, follow up, medications and instructions for their use discussed with patient. 

## 2016-11-18 ENCOUNTER — Telehealth: Payer: Self-pay | Admitting: Interventional Cardiology

## 2016-11-18 NOTE — Telephone Encounter (Signed)
Left message to call back  

## 2016-11-18 NOTE — Telephone Encounter (Signed)
New Message:   Pt said she saw Dr Katrinka Blazing and Laverda Page in the hospital yesterday.She was given a prescription for Enalapril.The pt said she is allergic to it,the pharmacist told her to contact her doctor.

## 2016-11-22 NOTE — Telephone Encounter (Signed)
Left message to call back  

## 2016-11-25 NOTE — Telephone Encounter (Signed)
Left message to call back  

## 2016-12-03 ENCOUNTER — Encounter: Payer: Self-pay | Admitting: *Deleted

## 2016-12-03 NOTE — Telephone Encounter (Signed)
Attempted to call pt at number listed and person that answered the phone said I had the wrong number.  Attempted to call husband's number and message came on stating number is disconnected or no longer in service. Found an alternate home number in Care Everywhere, no answer and no VM.  MyChart message sent requesting pt to contact our office in regards to this call.

## 2017-01-24 NOTE — Progress Notes (Deleted)
CARDIOLOGY OFFICE NOTE  Date:  01/24/2017    Mackenzie Richards Date of Birth: 08/01/1940 Medical Record #[id number]  PCP:  Vivia Birmingham, MD  Cardiologist:  Servando Snare & ***    No chief complaint on file.   History of Present Illness: Mackenzie Richards is a 76 y.o. female who presents today for a ***   coronary care unit nurse at [facility] who now receives all of her care at [facility]. She has prior history of LAD and right coronary stenting. She had coronary angiography at [facility] in 2005 revealed nonobstructive CAD. She presented with unstable angina in October 2010 to [facility] with unstable angina and had stenting of the mid LAD (2.5 x 12 Xience V). It is unclear when/where the right coronary stents were placed.  She experienced severe substernal chest discomfort at around 3:15 pm on the day of admission. Because of ongoing discomfort her husband called EMS. The transmitted EKG from EMS at 1550 revealed inferolateral ST elevation with reciprocal change. This led to STEMI activation. The patient was met in the emergency department. Upon arrival EMS informed Mackenzie Richards that 2 sublingual nitroglycerin tablets during transportation lead to resolution of discomfort. A repeat EKG done in the emergency department revealed complete resolution of ST segment abnormality. With known coronary disease she was brought to the catheterization laboratory for urgent evaluation despite normalization of EKG.   Underwent cardiac cath with Dr. Tamala Julian, noted above that showed ISR 80% of mid/proximal LAD. This was not intervened on per the patients request to have this corrected at [facility]. Did have DES placed to dRCA. Will plan for DAPT with ASA/plavix indefinitely. Labs were stable post cath. Troponin peaked 0.11. She was transferred to telemetry on 11/15/16. She was seen by Dr. Claiborne Billings and reported that she would like to follow up  at [facility] as planned to have LAD lesion treated. No further chest pain reported.   She was seen by Dr. Claiborne Billings and determined stable for discharge home. Follow up will be arranged by the patient at [facility] with her primary cardiologist. Medications are listed below.       Comes in today. Here with   Past Medical History:  Diagnosis Date  . Arthritis   . Asthma   . Bowen's disease   . Breast cyst   . Complication of anesthesia    i AM SLOW TO WAKE UP   . Coronary artery disease   . Diverticulitis   . DVT (deep venous thrombosis) ([place])   . Frequent UTI   . Hypertension   . Kidney cysts   . Myocardial infarction [facility]) 11/2016    Past Surgical History:  Procedure Laterality Date  . BILATERAL KNEE ARTHROSCOPY    . CORONARY STENT INTERVENTION N/A 11/15/2016   Procedure: Coronary Stent Intervention;  Surgeon: Belva Crome, MD;  Location: [place] CV LAB;  Service: Cardiovascular;  Laterality: N/A;  Distal RCA  . EYE SURGERY    . LEFT HEART CATH AND CORONARY ANGIOGRAPHY N/A 11/15/2016   Procedure: Left Heart Cath and Coronary Angiography;  Surgeon: Belva Crome, MD;  Location: [place] CV LAB;  Service: Cardiovascular;  Laterality: N/A;  . TUBAL LIGATION       Medications: No outpatient prescriptions have been marked as taking for the 01/25/17 encounter (Appointment) with Burtis Junes, NP.     Allergies: Allergies  Allergen Reactions  . Biaxin [Clarithromycin] Other (See  Comments)    Unknown  . Enalapril Itching and Other (See Comments)    Pt states she gets hyper  . Fentanyl Other (See Comments)    Unknown  . Hydrochlorothiazide Other (See Comments)    Unknown  . Lasix [Furosemide] Other (See Comments)    Unknown  . Levaquin [Levofloxacin In D5w] Other (See Comments)    Unknown  . Penicillins Other (See Comments)    Unknown  . Percocet [Oxycodone-Acetaminophen] Other (See Comments)    Unknown  . Sulfa Antibiotics Other (See Comments)    Unknown  .  Zyban [Bupropion] Other (See Comments)    Unknown    Social History: The patient  reports that she has been smoking Cigarettes.  She has been smoking about 1.00 pack per day. She has never used smokeless tobacco. She reports that she does not drink alcohol or use drugs.   Family History: The patient's ***family history is not on file.   Review of Systems: Please see the history of present illness.   Otherwise, the review of systems is positive for {NONE DEFAULTED:18576::"none"}.   All other systems are reviewed and negative.   Physical Exam: VS:  There were no vitals taken for this visit. Mackenzie Richards  BMI There is no height or weight on file to calculate BMI.  Wt Readings from Last 3 Encounters:  11/16/16 136 lb 3.2 oz (61.8 kg)  11/13/16 130 lb (59 kg)  03/18/15 135 lb (61.2 kg)    General: Pleasant. Well developed, well nourished and in no acute distress.   HEENT: Normal.  Neck: Supple, no JVD, carotid bruits, or masses noted.  Cardiac: ***Regular rate and rhythm. No murmurs, rubs, or gallops. No edema.  Respiratory:  Lungs are clear to auscultation bilaterally with normal work of breathing.  GI: Soft and nontender.  MS: No deformity or atrophy. Gait and ROM intact.  Skin: Warm and dry. Color is normal.  Neuro:  Strength and sensation are intact and no gross focal deficits noted.  Psych: Alert, appropriate and with normal affect.   LABORATORY DATA:  EKG:  EKG {ACTION; IS/IS OHY:[id number]} ordered today. This demonstrates ***.  Lab Results  Component Value Date   WBC 11.0 (H) 11/16/2016   HGB 11.9 (L) 11/16/2016   HCT 36.6 11/16/2016   PLT 341 11/16/2016   GLUCOSE 91 11/17/2016   CHOL 144 11/15/2016   TRIG 106 11/15/2016   HDL 23 (L) 11/15/2016   LDLCALC 100 (H) 11/15/2016   ALT 10 (L) 11/15/2016   AST 16 11/15/2016   NA 134 (L) 11/17/2016   K 4.0 11/17/2016   CL 105 11/17/2016   CREATININE 1.03 (H) 11/17/2016   BUN 10 11/17/2016   CO2 20 (L) 11/17/2016   INR 1.38  11/15/2016   HGBA1C 6.1 (H) 11/15/2016     BNP (last 3 results) No results for input(s): BNP in the last 8760 hours.  ProBNP (last 3 results) No results for input(s): PROBNP in the last 8760 hours.   Other Studies Reviewed Today: LHC: 11/15/16  Conclusion    Subtotal occlusion of the distal circumflex with a hazy appearance compatible with spontaneous reperfusion and aborted inferior ST elevation myocardial infarction.  Successful PTCA and stent implantation in the distal right coronary reducing the 99% stenosis to 0% with TIMI grade 3 flow using a 2.75 mm Synergy DES.  Proximal to mid LAD in-stent restenosis 75-80%. The obstruction extends beyond the proximal margin of the stent.  Widely patent circumflex.  Normal left ventricular  systolic function. EF 60%.   RECOMMENDATIONS:   Aspirin and Plavix indefinitely  Will need PCI on the proximal to mid LAD ISR. She would like to have this performed through her cardiologist at [facility].  Risk factor modification, resuming statin therapy, antihypertensive therapy.  Possibly old for discharge tomorrow but need to have specific plans to follow-up with her cardiologist and have staged LAD PCI.    Assessment/Plan:   Current medicines are reviewed with the patient today.  The patient does not have concerns regarding medicines other than what has been noted above.  The following changes have been made:  See above.  Labs/ tests ordered today include:   No orders of the defined types were placed in this encounter.    Disposition:   FU with *** in {gen number 2-19:471252} {Days to years:10300}.   Patient is agreeable to this plan and will call if any problems develop in the interim.   SignedTruitt Merle, NP  01/24/2017 7:51 PM  [facility] [address] [place], [place]  [address] Phone: [phone] Fax: [phone]

## 2017-01-25 ENCOUNTER — Ambulatory Visit: Payer: Medicare HMO | Admitting: Nurse Practitioner

## 2017-05-08 ENCOUNTER — Other Ambulatory Visit: Payer: Self-pay | Admitting: Cardiology

## 2017-08-05 DIAGNOSIS — I639 Cerebral infarction, unspecified: Secondary | ICD-10-CM

## 2017-08-05 HISTORY — DX: Cerebral infarction, unspecified: I63.9

## 2017-08-12 ENCOUNTER — Encounter (HOSPITAL_COMMUNITY): Payer: Self-pay

## 2017-08-12 ENCOUNTER — Inpatient Hospital Stay (HOSPITAL_COMMUNITY)
Admission: EM | Admit: 2017-08-12 | Discharge: 2017-08-15 | DRG: 040 | Disposition: A | Discharge status: Home Health | Payer: Medicare HMO | Attending: Internal Medicine | Admitting: Internal Medicine

## 2017-08-12 ENCOUNTER — Emergency Department (HOSPITAL_COMMUNITY): Payer: Medicare HMO

## 2017-08-12 DIAGNOSIS — J45909 Unspecified asthma, uncomplicated: Secondary | ICD-10-CM | POA: Diagnosis present

## 2017-08-12 DIAGNOSIS — R748 Abnormal levels of other serum enzymes: Secondary | ICD-10-CM | POA: Diagnosis present

## 2017-08-12 DIAGNOSIS — I252 Old myocardial infarction: Secondary | ICD-10-CM

## 2017-08-12 DIAGNOSIS — Z8249 Family history of ischemic heart disease and other diseases of the circulatory system: Secondary | ICD-10-CM

## 2017-08-12 DIAGNOSIS — I639 Cerebral infarction, unspecified: Secondary | ICD-10-CM

## 2017-08-12 DIAGNOSIS — Z86711 Personal history of pulmonary embolism: Secondary | ICD-10-CM

## 2017-08-12 DIAGNOSIS — F17210 Nicotine dependence, cigarettes, uncomplicated: Secondary | ICD-10-CM | POA: Diagnosis present

## 2017-08-12 DIAGNOSIS — Z79899 Other long term (current) drug therapy: Secondary | ICD-10-CM

## 2017-08-12 DIAGNOSIS — I63429 Cerebral infarction due to embolism of unspecified anterior cerebral artery: Secondary | ICD-10-CM | POA: Diagnosis not present

## 2017-08-12 DIAGNOSIS — I2510 Atherosclerotic heart disease of native coronary artery without angina pectoris: Secondary | ICD-10-CM | POA: Diagnosis present

## 2017-08-12 DIAGNOSIS — Z7902 Long term (current) use of antithrombotics/antiplatelets: Secondary | ICD-10-CM

## 2017-08-12 DIAGNOSIS — I10 Essential (primary) hypertension: Secondary | ICD-10-CM

## 2017-08-12 DIAGNOSIS — F17200 Nicotine dependence, unspecified, uncomplicated: Secondary | ICD-10-CM

## 2017-08-12 DIAGNOSIS — R040 Epistaxis: Secondary | ICD-10-CM | POA: Diagnosis not present

## 2017-08-12 DIAGNOSIS — Z955 Presence of coronary angioplasty implant and graft: Secondary | ICD-10-CM

## 2017-08-12 DIAGNOSIS — E785 Hyperlipidemia, unspecified: Secondary | ICD-10-CM | POA: Diagnosis present

## 2017-08-12 DIAGNOSIS — R471 Dysarthria and anarthria: Secondary | ICD-10-CM | POA: Diagnosis present

## 2017-08-12 DIAGNOSIS — N183 Chronic kidney disease, stage 3 (moderate): Secondary | ICD-10-CM | POA: Diagnosis present

## 2017-08-12 DIAGNOSIS — Z86718 Personal history of other venous thrombosis and embolism: Secondary | ICD-10-CM

## 2017-08-12 DIAGNOSIS — R29702 NIHSS score 2: Secondary | ICD-10-CM | POA: Diagnosis present

## 2017-08-12 DIAGNOSIS — I129 Hypertensive chronic kidney disease with stage 1 through stage 4 chronic kidney disease, or unspecified chronic kidney disease: Secondary | ICD-10-CM | POA: Diagnosis present

## 2017-08-12 DIAGNOSIS — R531 Weakness: Secondary | ICD-10-CM

## 2017-08-12 DIAGNOSIS — G8191 Hemiplegia, unspecified affecting right dominant side: Secondary | ICD-10-CM | POA: Diagnosis present

## 2017-08-12 DIAGNOSIS — Z7982 Long term (current) use of aspirin: Secondary | ICD-10-CM

## 2017-08-12 DIAGNOSIS — G9341 Metabolic encephalopathy: Secondary | ICD-10-CM | POA: Diagnosis present

## 2017-08-12 DIAGNOSIS — R001 Bradycardia, unspecified: Secondary | ICD-10-CM | POA: Diagnosis present

## 2017-08-12 LAB — URINALYSIS, ROUTINE W REFLEX MICROSCOPIC
Bacteria, UA: NONE SEEN
Bilirubin Urine: NEGATIVE
GLUCOSE, UA: NEGATIVE mg/dL
Ketones, ur: NEGATIVE mg/dL
Leukocytes, UA: NEGATIVE
Nitrite: NEGATIVE
PH: 5 (ref 5.0–8.0)
Protein, ur: NEGATIVE mg/dL
SPECIFIC GRAVITY, URINE: 1.012 (ref 1.005–1.030)
Squamous Epithelial / LPF: NONE SEEN

## 2017-08-12 LAB — CBC WITH DIFFERENTIAL/PLATELET
BASOS ABS: 0.1 10*3/uL (ref 0.0–0.1)
Basophils Relative: 1 %
Eosinophils Absolute: 0.3 10*3/uL (ref 0.0–0.7)
Eosinophils Relative: 4 %
HEMATOCRIT: 44.1 % (ref 36.0–46.0)
Hemoglobin: 14.4 g/dL (ref 12.0–15.0)
LYMPHS ABS: 2.9 10*3/uL (ref 0.7–4.0)
Lymphocytes Relative: 37 %
MCH: 33.7 pg (ref 26.0–34.0)
MCHC: 32.7 g/dL (ref 30.0–36.0)
MCV: 103.3 fL — ABNORMAL HIGH (ref 78.0–100.0)
MONO ABS: 0.8 10*3/uL (ref 0.1–1.0)
Monocytes Relative: 10 %
NEUTROS ABS: 3.8 10*3/uL (ref 1.7–7.7)
Neutrophils Relative %: 48 %
Platelets: 324 10*3/uL (ref 150–400)
RBC: 4.27 MIL/uL (ref 3.87–5.11)
RDW: 14.5 % (ref 11.5–15.5)
WBC: 7.8 10*3/uL (ref 4.0–10.5)

## 2017-08-12 LAB — RAPID URINE DRUG SCREEN, HOSP PERFORMED
Amphetamines: NOT DETECTED
Barbiturates: NOT DETECTED
Benzodiazepines: NOT DETECTED
COCAINE: NOT DETECTED
OPIATES: NOT DETECTED
TETRAHYDROCANNABINOL: NOT DETECTED

## 2017-08-12 LAB — COMPREHENSIVE METABOLIC PANEL
ALK PHOS: 78 U/L (ref 38–126)
ALT: 10 U/L — AB (ref 14–54)
AST: 17 U/L (ref 15–41)
Albumin: 3.7 g/dL (ref 3.5–5.0)
Anion gap: 8 (ref 5–15)
BILIRUBIN TOTAL: 0.7 mg/dL (ref 0.3–1.2)
BUN: 22 mg/dL — AB (ref 6–20)
CALCIUM: 8.8 mg/dL — AB (ref 8.9–10.3)
CO2: 24 mmol/L (ref 22–32)
CREATININE: 1.21 mg/dL — AB (ref 0.44–1.00)
Chloride: 109 mmol/L (ref 101–111)
GFR calc Af Amer: 49 mL/min — ABNORMAL LOW (ref >60)
GFR calc non Af Amer: 43 mL/min — ABNORMAL LOW (ref >60)
Glucose, Bld: 98 mg/dL (ref 65–99)
Potassium: 4.1 mmol/L (ref 3.5–5.1)
Sodium: 141 mmol/L (ref 135–145)
TOTAL PROTEIN: 7 g/dL (ref 6.5–8.1)

## 2017-08-12 LAB — INFLUENZA PANEL BY PCR (TYPE A & B)
Influenza A By PCR: NEGATIVE
Influenza B By PCR: NEGATIVE

## 2017-08-12 MED ORDER — SODIUM CHLORIDE 0.9 % IJ SOLN
INTRAMUSCULAR | Status: AC
  Filled 2017-08-12: qty 50

## 2017-08-12 MED ORDER — STROKE: EARLY STAGES OF RECOVERY BOOK
Freq: Once | Status: DC
  Filled 2017-08-12: qty 1

## 2017-08-12 MED ORDER — POTASSIUM CHLORIDE CRYS ER 20 MEQ PO TBCR
20.0000 meq | EXTENDED_RELEASE_TABLET | Freq: Every day | ORAL | Status: DC
  Administered 2017-08-13 – 2017-08-15 (×3): 20 meq via ORAL
  Filled 2017-08-12 (×3): qty 1

## 2017-08-12 MED ORDER — ENOXAPARIN SODIUM 40 MG/0.4ML ~~LOC~~ SOLN
40.0000 mg | SUBCUTANEOUS | Status: DC
  Administered 2017-08-13: 40 mg via SUBCUTANEOUS
  Filled 2017-08-12: qty 0.4

## 2017-08-12 MED ORDER — ATORVASTATIN CALCIUM 80 MG PO TABS
80.0000 mg | ORAL_TABLET | Freq: Every day | ORAL | Status: DC
  Administered 2017-08-13 – 2017-08-15 (×3): 80 mg via ORAL
  Filled 2017-08-12 (×3): qty 1

## 2017-08-12 MED ORDER — ZOLPIDEM TARTRATE 5 MG PO TABS
5.0000 mg | ORAL_TABLET | Freq: Every evening | ORAL | Status: DC | PRN
  Administered 2017-08-13: 5 mg via ORAL
  Filled 2017-08-12: qty 1

## 2017-08-12 MED ORDER — SODIUM CHLORIDE 0.9 % IV SOLN: INTRAVENOUS | Status: AC

## 2017-08-12 MED ORDER — CLOPIDOGREL BISULFATE 75 MG PO TABS
75.0000 mg | ORAL_TABLET | Freq: Every day | ORAL | Status: DC
  Administered 2017-08-13 – 2017-08-15 (×3): 75 mg via ORAL
  Filled 2017-08-12 (×3): qty 1

## 2017-08-12 MED ORDER — NITROGLYCERIN 0.4 MG SL SUBL: 0.4000 mg | SUBLINGUAL_TABLET | SUBLINGUAL | Status: DC | PRN

## 2017-08-12 MED ORDER — METOPROLOL TARTRATE 12.5 MG HALF TABLET
12.5000 mg | ORAL_TABLET | Freq: Two times a day (BID) | ORAL | Status: DC
  Administered 2017-08-13 – 2017-08-15 (×4): 12.5 mg via ORAL
  Filled 2017-08-12 (×5): qty 1

## 2017-08-12 MED ORDER — ENALAPRIL MALEATE 20 MG PO TABS
20.0000 mg | ORAL_TABLET | Freq: Every day | ORAL | Status: DC
  Administered 2017-08-13 – 2017-08-15 (×3): 20 mg via ORAL
  Filled 2017-08-12 (×3): qty 2

## 2017-08-12 MED ORDER — IOPAMIDOL (ISOVUE-370) INJECTION 76%
INTRAVENOUS | Status: AC
  Administered 2017-08-12: 100 mL via INTRAVENOUS
  Filled 2017-08-12: qty 100

## 2017-08-12 MED ORDER — SODIUM CHLORIDE 0.9 % IV BOLUS (SEPSIS)
1000.0000 mL | Freq: Once | INTRAVENOUS | Status: AC
  Administered 2017-08-12: 1000 mL via INTRAVENOUS

## 2017-08-12 MED ORDER — CLONIDINE HCL 0.1 MG PO TABS
0.1000 mg | ORAL_TABLET | Freq: Every day | ORAL | Status: DC
  Administered 2017-08-13 – 2017-08-14 (×2): 0.1 mg via ORAL
  Filled 2017-08-12 (×2): qty 1

## 2017-08-12 MED ORDER — ASPIRIN 81 MG PO CHEW: 81.0000 mg | CHEWABLE_TABLET | Freq: Every day | ORAL | Status: DC

## 2017-08-12 NOTE — ED Notes (Signed)
Report called to receiving RN.

## 2017-08-12 NOTE — ED Notes (Signed)
Attempted to call report, charge nurse is taking patient and asked for 5 minutes. Nurse will call back.

## 2017-08-12 NOTE — ED Triage Notes (Signed)
Pt presents with c/o flu like symptoms for 2 weeks and weakness. Pt lives at home and her housemates were upset because she did not want to come to the hospital. Pt is able to answer questions per EMS. Pt not c/o any pain.

## 2017-08-12 NOTE — ED Provider Notes (Signed)
[facility]-EMERGENCY DEPT Provider Note   CSN: [id number] Arrival date & time: 08/12/17  1541     History   Chief Complaint Chief Complaint  Patient presents with  . Weakness    HPI Mackenzie Richards is a 77 y.o. female.  The patient has had weakness since yesterday morning with slurred speech since yesterday evening.  She has needed help for ambulation   The history is provided by a relative. No language interpreter was used.  Weakness  Primary symptoms include focal weakness. This is a new problem. The problem has not changed since onset.There was right upper extremity and right lower extremity focality noted. There has been no fever. Pertinent negatives include no shortness of breath, no chest pain and no headaches. There were no medications administered prior to arrival. Associated medical issues do not include trauma.    Past Medical History:  Diagnosis Date  . Arthritis   . Asthma   . Bowen's disease   . Breast cyst   . Complication of anesthesia    i AM SLOW TO WAKE UP   . Coronary artery disease   . Diverticulitis   . DVT (deep venous thrombosis) ([facility])   . Frequent UTI   . Hypertension   . Kidney cysts   . Myocardial infarction [facility]) 11/2016    Patient Active Problem List   Diagnosis Date Noted  . Acute ST elevation myocardial infarction (STEMI) of inferior wall (H[facility])   . Aborted myocardial infarction ([facility])   . 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 Asthma 03/18/2015  . Dyslipidemia 03/18/2015  . History of coronary artery stent placement (2009/2010) 03/18/2015  . Heavy smoker 03/18/2015  . Aphasia     Past Surgical History:  Procedure Laterality Date  . BILATERAL KNEE ARTHROSCOPY    . CORONARY STENT INTERVENTION N/A 11/15/2016   Procedure: Coronary Stent Intervention;  Surgeon: Lyn Records, MD;  Location: [facility] INVASIVE CV LAB;  Service: Cardiovascular;  Laterality: N/A;  Distal RCA  . EYE SURGERY    . LEFT HEART CATH AND  CORONARY ANGIOGRAPHY N/A 11/15/2016   Procedure: Left Heart Cath and Coronary Angiography;  Surgeon: Lyn Records, MD;  Location: [facility] INVASIVE CV LAB;  Service: Cardiovascular;  Laterality: N/A;  . TUBAL LIGATION      OB History    No data available       Home Medications    Prior to Admission medications   Medication Sig Start Date End Date Taking? Authorizing Provider  Acetaminophen 500 MG coapsule Take 1,000 mg by mouth every 6 (six) hours as needed for moderate pain or headache.    Yes [provider]  aspirin 81 MG chewable tablet Chew 1 tablet (81 mg total) by mouth daily. 11/18/16  Yes Arty Baumgartner, NP  atorvastatin (LIPITOR) 80 MG tablet Take 1 tablet (80 mg total) by mouth daily at 6 PM. 11/17/16  Yes Laverda Page B, NP  cloNIDine (CATAPRES) 0.1 MG tablet Take 0.1 mg by mouth See admin instructions. Take 0.1 mg by mouth daily. May take 0.1 mg additional if needed   Yes [provider]  clopidogrel (PLAVIX) 75 MG tablet Take 1 tablet (75 mg total) by mouth daily with breakfast. 11/18/16  Yes Laverda Page B, NP  enalapril (VASOTEC) 20 MG tablet Take 1 tablet (20 mg total) by mouth daily. 11/18/16  Yes Laverda Page B, NP  metoprolol tartrate (LOPRESSOR) 25 MG tablet TAKE 1/2  TABLET BY MOUTH TWICE DAILY  05/09/17  Yes Laverda Page B, NP  potassium chloride SA (K-DUR,KLOR-CON) 20 MEQ tablet Take 1 tablet (20 mEq total) by mouth daily. 11/13/16  Yes Fayrene Helper, PA-C  zolpidem (AMBIEN) 10 MG tablet Take 10 mg by mouth at bedtime.    Yes [provider]  ciprofloxacin (CIPRO) 500 MG tablet Take 1 tablet (500 mg total) by mouth 2 (two) times daily. One po bid x 7 days Patient not taking: Reported on 08/12/2017 11/13/16   Fayrene Helper, PA-C  metroNIDAZOLE (FLAGYL) 500 MG tablet Take 1 tablet (500 mg total) by mouth 2 (two) times daily. Patient not taking: Reported on 08/12/2017 11/13/16   Fayrene Helper, PA-C  nitroGLYCERIN (NITROSTAT) 0.4 MG SL tablet Place  1 tablet (0.4 mg total) under the tongue every 5 (five) minutes as needed. 11/17/16   Arty Baumgartner, NP    Family History History reviewed. No pertinent family history.  Social History Social History   Tobacco Use  . Smoking status: Current Every Day Smoker    Packs/day: 1.00    Types: Cigarettes  . Smokeless tobacco: Never Used  Substance Use Topics  . Alcohol use: No  . Drug use: No     Allergies   Biaxin [clarithromycin]; Enalapril; Fentanyl; Hydrochlorothiazide; Lasix [furosemide]; Levaquin [levofloxacin in d5w]; Penicillins; Percocet [oxycodone-acetaminophen]; Sulfa antibiotics; and Zyban [bupropion]   Review of Systems Review of Systems  Constitutional: Negative for appetite change and fatigue.  HENT: Negative for congestion, ear discharge and sinus pressure.   Eyes: Negative for discharge.  Respiratory: Negative for cough and shortness of breath.   Cardiovascular: Negative for chest pain.  Gastrointestinal: Negative for abdominal pain and diarrhea.  Genitourinary: Negative for frequency and hematuria.  Musculoskeletal: Negative for back pain.  Skin: Negative for rash.  Neurological: Positive for focal weakness and weakness. Negative for seizures and headaches.  Psychiatric/Behavioral: Negative for hallucinations.     Physical Exam Updated Vital Signs BP (!) 155/63   Pulse (!) 59   Temp 97.9 F (36.6 C) (Oral)   Resp 18   SpO2 97%   Physical Exam  Constitutional: She is oriented to person, place, and time. She appears well-developed.  HENT:  Head: Normocephalic.  Eyes: Conjunctivae and EOM are normal. No scleral icterus.  Neck: Neck supple. No thyromegaly present.  Cardiovascular: Normal rate and regular rhythm. Exam reveals no gallop and no friction rub.  No murmur heard. Pulmonary/Chest: No stridor. She has no wheezes. She has no rales. She exhibits no tenderness.  Abdominal: She exhibits no distension. There is no tenderness. There is no rebound.   Musculoskeletal: Normal range of motion. She exhibits no edema.  Lymphadenopathy:    She has no cervical adenopathy.  Neurological: She is oriented to person, place, and time. She exhibits normal muscle tone. Coordination normal.  Patient with mild slurred speech and mild weakness right arm right leg  Skin: No rash noted. No erythema.     ED Treatments / Results  Labs (all labs ordered are listed, but only abnormal results are displayed) Labs Reviewed  CBC WITH DIFFERENTIAL/PLATELET - Abnormal; Notable for the following components:      Result Value   MCV 103.3 (*)    All other components within normal limits  COMPREHENSIVE METABOLIC PANEL - Abnormal; Notable for the following components:   BUN 22 (*)    Creatinine, Ser 1.21 (*)    Calcium 8.8 (*)    ALT 10 (*)    GFR calc non Af Amer 43 (*)  GFR calc Af Amer 49 (*)    All other components within normal limits  URINALYSIS, ROUTINE W REFLEX MICROSCOPIC - Abnormal; Notable for the following components:   Hgb urine dipstick SMALL (*)    All other components within normal limits  INFLUENZA PANEL BY PCR (TYPE A & B)  RAPID URINE DRUG SCREEN, HOSP PERFORMED    EKG  EKG Interpretation None       Radiology Ct Angio Head W Or Wo Contrast  Result Date: 08/12/2017 CLINICAL DATA:  Flu-like symptoms for 2 weeks with weakness. Headache. History of hypertension. EXAM: CT ANGIOGRAPHY HEAD TECHNIQUE: Multidetector CT imaging of the head was performed using the standard protocol during bolus administration of intravenous contrast. Multiplanar CT image reconstructions and MIPs were obtained to evaluate the vascular anatomy. CONTRAST:  ISOVUE-370 IOPAMIDOL (ISOVUE-370) INJECTION 76% COMPARISON:  MRI/MRA head March 18, 2015. CT HEAD March 18, 2015 FINDINGS: CT HEAD BRAIN: No intraparenchymal hemorrhage, mass effect nor midline shift. The ventricles and sulci are normal for age. Confluent supratentorial white matter hypodensities.  Faint hypodensities bilateral basal ganglia and thalami associated with chronic small vessel ischemic disease. New nonacute antrum mesial RIGHT frontal lobe encephalomalacia. No acute large vascular territory infarcts. No abnormal extra-axial fluid collections. Basal cisterns are patent. VASCULAR: Mild calcific atherosclerosis of the carotid siphons. SKULL: No skull fracture. Mild RIGHT temporomandibular osteoarthrosis. No significant scalp soft tissue swelling. SINUSES/ORBITS: Trace paranasal sinus mucosal thickening with small mucosal retention cyst. Trace bilateral mastoid effusions. Included ocular globes and orbital contents are non-suspicious. Status post bilateral ocular lens implants. OTHER: None. CTA HEAD ANTERIOR CIRCULATION: Patent cervical internal carotid arteries, petrous, cavernous and supra clinoid internal carotid arteries. Patent anterior communicating artery. Patent anterior and middle cerebral arteries. Severe tandem stenosis LEFT A2 segment and severe stenosis RIGHT A3 segment. Moderate and severe tandem stenosis bilateral middle cerebral arteries. No large vessel occlusion,  contrast extravasation or aneurysm. POSTERIOR CIRCULATION: Patent vertebral arteries, vertebrobasilar junction and basilar artery, as well as main branch vessels. Patent posterior cerebral arteries. Scattered severe stenosis mid to distal posterior cerebral arteries. No large vessel occlusion, contrast extravasation or aneurysm. VENOUS SINUSES: Major dural venous sinuses are patent though not tailored for evaluation on this angiographic examination. ANATOMIC VARIANTS: None. DELAYED PHASE: No abnormal intracranial enhancement. MIP images reviewed. IMPRESSION: CT HEAD: 1. No acute intracranial process. 2. New nonacute small RIGHT frontal lobe/ACA territory infarct. 3. Progressed moderate to severe chronic small vessel ischemic disease. CTA HEAD: 1. No emergent large vessel occlusion. 2. Moderate atherosclerosis with  superimposed multifocal severe stenosis bilateral anterior, middle and posterior cerebral arteries. Electronically Signed   By: Awilda Metro M.D.   On: 08/12/2017 19:25   Dg Chest 2 View  Result Date: 08/12/2017 CLINICAL DATA:  Weakness and flu like symptoms. EXAM: CHEST - 2 VIEW COMPARISON:  11/18/2013 and prior chest radiographs FINDINGS: This is a mildly low volume film. The cardiomediastinal silhouette is unremarkable. Mild peribronchial thickening is unchanged. There is no evidence of focal airspace disease, pulmonary edema, suspicious pulmonary nodule/mass, pleural effusion, or pneumothorax. No acute bony abnormalities are identified. IMPRESSION: No active cardiopulmonary disease. Electronically Signed   By: Harmon Pier M.D.   On: 08/12/2017 17:36    Procedures Procedures (including critical care time)  Medications Ordered in ED Medications  sodium chloride 0.9 % injection (not administered)  sodium chloride 0.9 % bolus 1,000 mL (0 mLs Intravenous Stopped 08/12/17 2043)  iopamidol (ISOVUE-370) 76 % injection (100 mLs Intravenous Contrast Given 08/12/17 1845)  Initial Impression / Assessment and Plan / ED Course  I have reviewed the triage vital signs and the nursing notes.  Pertinent labs & imaging results that were available during my care of the patient were reviewed by me and considered in my medical decision making (see chart for details). CRITICAL CARE Performed by: Bethann Berkshire Total critical care time  45 minutes Critical care time was exclusive of separately billable procedures and treating other patients. Critical care was necessary to treat or prevent imminent or life-threatening deterioration. Critical care was time spent personally by me on the following activities: development of treatment plan with patient and/or surrogate as well as nursing, discussions with consultants, evaluation of patient's response to treatment, examination of patient, obtaining history from  patient or surrogate, ordering and performing treatments and interventions, ordering and review of laboratory studies, ordering and review of radiographic studies, pulse oximetry and re-evaluation of patient's condition.       Patient with acute weakness and slurred speech.  Most likely stroke related.  Patient will be admitted to medicine at [facility] with neurology consult and MRI of the brain  Final Clinical Impressions(s) / ED Diagnoses   Final diagnoses:  Weakness    ED Discharge Orders    None       Bethann Berkshire, MD 08/12/17 2107

## 2017-08-12 NOTE — H&P (Signed)
History and Physical    Mackenzie Richards ZOX:[id number] DOB: January 03, 1941 DOA: 08/12/2017  PCP: Winifred Olive, MD  Patient coming from: Home.  Chief Complaint: Slurred speech and difficulty walking.  HPI: Mackenzie Richards is a 77 y.o. female with history of CAD status post stenting last one in June 2018 for acute MI, hypertension was brought to the ER after patient was found to have increasing persistent slurred speech.  Patient symptoms started last few days ago with increasing generalized weakness.  Yesterday, on August 11, 2017 evening patient slurred speech became more pronounced and today noticed that patient was finding it difficult to walk.  Patient was later brought to the ER with concerns for stroke.  Patient denies any difficulty swallowing or any visual symptoms.  Has complaint of generalized weakness.  ED Course: In the ER on exam patient has mild weakness of the right extremities and had slurred speech which patient's family feels is more than usual.  CT head followed by CT angiogram of the head and neck was done which does not show any large vessel occlusion except for new nonacute small right frontal infarct.  EKG was showing normal sinus rhythm.  On-call neurologist Dr. Wilford Corner was consulted and admitted for possible stroke.  Review of Systems: As per HPI, rest all negative.   Past Medical History:  Diagnosis Date  . Arthritis   . Asthma   . Bowen's disease   . Breast cyst   . Complication of anesthesia    i AM SLOW TO WAKE UP   . Coronary artery disease   . Diverticulitis   . DVT (deep venous thrombosis) (HCC)   . Frequent UTI   . Hypertension   . Kidney cysts   . Myocardial infarction [facility]) 11/2016    Past Surgical History:  Procedure Laterality Date  . BILATERAL KNEE ARTHROSCOPY    . CORONARY STENT INTERVENTION N/A 11/15/2016   Procedure: Coronary Stent Intervention;  Surgeon: Lyn Records, MD;  Location: [facility] INVASIVE CV LAB;  Service: Cardiovascular;  Laterality:  N/A;  Distal RCA  . EYE SURGERY    . LEFT HEART CATH AND CORONARY ANGIOGRAPHY N/A 11/15/2016   Procedure: Left Heart Cath and Coronary Angiography;  Surgeon: Lyn Records, MD;  Location: [facility] INVASIVE CV LAB;  Service: Cardiovascular;  Laterality: N/A;  . TUBAL LIGATION       reports that she has been smoking cigarettes.  She has been smoking about 1.00 pack per day. she has never used smokeless tobacco. She reports that she does not drink alcohol or use drugs.  Allergies  Allergen Reactions  . Biaxin [Clarithromycin] Other (See Comments)    Unknown  . Enalapril Itching and Other (See Comments)    Pt states she gets hyper  . Fentanyl Other (See Comments)    Unknown  . Hydrochlorothiazide Other (See Comments)    Unknown  . Lasix [Furosemide] Other (See Comments)    Unknown  . Levaquin [Levofloxacin In D5w] Other (See Comments)    Unknown  . Penicillins Other (See Comments)    Has patient had a PCN reaction causing immediate rash, facial/tongue/throat swelling, SOB or lightheadedness with hypotension: Yes Has patient had a PCN reaction causing severe rash involving mucus membranes or skin necrosis: No Has patient had a PCN reaction that required hospitalization: No Has patient had a PCN reaction occurring within the last 10 years: No If all of the above answers are "NO", then may proceed with Cephalosporin use.   Marland Kitchen  Percocet [Oxycodone-Acetaminophen] Other (See Comments)    Unknown  . Sulfa Antibiotics Other (See Comments)    Unknown  . Zyban [Bupropion] Other (See Comments)    Unknown    Family History  Problem Relation Age of Onset  . Hypertension Other     Prior to Admission medications   Medication Sig Start Date End Date Taking? Authorizing Provider  Acetaminophen 500 MG coapsule Take 1,000 mg by mouth every 6 (six) hours as needed for moderate pain or headache.    Yes [provider]  aspirin 81 MG chewable tablet Chew 1 tablet (81 mg total) by mouth daily.  11/18/16  Yes Arty Baumgartner, NP  atorvastatin (LIPITOR) 80 MG tablet Take 1 tablet (80 mg total) by mouth daily at 6 PM. 11/17/16  Yes Laverda Page B, NP  cloNIDine (CATAPRES) 0.1 MG tablet Take 0.1 mg by mouth See admin instructions. Take 0.1 mg by mouth daily. May take 0.1 mg additional if needed   Yes [provider]  clopidogrel (PLAVIX) 75 MG tablet Take 1 tablet (75 mg total) by mouth daily with breakfast. 11/18/16  Yes Laverda Page B, NP  enalapril (VASOTEC) 20 MG tablet Take 1 tablet (20 mg total) by mouth daily. 11/18/16  Yes Laverda Page B, NP  metoprolol tartrate (LOPRESSOR) 25 MG tablet TAKE 1/2  TABLET BY MOUTH TWICE DAILY 05/09/17  Yes Laverda Page B, NP  potassium chloride SA (K-DUR,KLOR-CON) 20 MEQ tablet Take 1 tablet (20 mEq total) by mouth daily. 11/13/16  Yes Fayrene Helper, PA-C  zolpidem (AMBIEN) 10 MG tablet Take 10 mg by mouth at bedtime.    Yes [provider]  ciprofloxacin (CIPRO) 500 MG tablet Take 1 tablet (500 mg total) by mouth 2 (two) times daily. One po bid x 7 days Patient not taking: Reported on 08/12/2017 11/13/16   Fayrene Helper, PA-C  metroNIDAZOLE (FLAGYL) 500 MG tablet Take 1 tablet (500 mg total) by mouth 2 (two) times daily. Patient not taking: Reported on 08/12/2017 11/13/16   Fayrene Helper, PA-C  nitroGLYCERIN (NITROSTAT) 0.4 MG SL tablet Place 1 tablet (0.4 mg total) under the tongue every 5 (five) minutes as needed. 11/17/16   Arty Baumgartner, NP    Physical Exam: Vitals:   08/12/17 1800 08/12/17 1830 08/12/17 1915 08/12/17 2121  BP: (!) 173/84 140/60 (!) 155/63   Pulse: (!) 50 (!) 54 (!) 59   Resp: (!) 21 (!) 21 18   Temp:    97.8 F (36.6 C)  TempSrc:      SpO2: 93% 99% 97%       Constitutional: Moderately built and nourished. Vitals:   08/12/17 1800 08/12/17 1830 08/12/17 1915 08/12/17 2121  BP: (!) 173/84 140/60 (!) 155/63   Pulse: (!) 50 (!) 54 (!) 59   Resp: (!) 21 (!) 21 18   Temp:    97.8 F (36.6 C)    TempSrc:      SpO2: 93% 99% 97%    Eyes: Anicteric no pallor. ENMT: No discharge from the ears eyes nose or mouth. Neck: No neck rigidity no mass felt but no JVD appreciated. Respiratory: No rhonchi or crepitations. Cardiovascular: S1-S2 heard no murmurs appreciated. Abdomen: Soft nontender bowel sounds present. Musculoskeletal: No edema.  No joint effusion. Skin: No rash.  Skin appears warm. Neurologic: Alert awake oriented to time place and person.  Has slurred speech.  No facial asymmetry.  Tongue is midline.  Pupils equal and reacting to light.  Patient has  mild weakness 4 x 5 in the right upper extremity.  Rest of the extremities are 5 x 5. Psychiatric: Appears normal.   Labs on Admission: I have personally reviewed following labs and imaging studies  CBC: Recent Labs  Lab 08/12/17 1611  WBC 7.8  NEUTROABS 3.8  HGB 14.4  HCT 44.1  MCV 103.3*  PLT 324   Basic Metabolic Panel: Recent Labs  Lab 08/12/17 1611  NA 141  K 4.1  CL 109  CO2 24  GLUCOSE 98  BUN 22*  CREATININE 1.21*  CALCIUM 8.8*   GFR: CrCl cannot be calculated (Unknown ideal weight.). Liver Function Tests: Recent Labs  Lab 08/12/17 1611  AST 17  ALT 10*  ALKPHOS 78  BILITOT 0.7  PROT 7.0  ALBUMIN 3.7   No results for input(s): LIPASE, AMYLASE in the last 168 hours. No results for input(s): AMMONIA in the last 168 hours. Coagulation Profile: No results for input(s): INR, PROTIME in the last 168 hours. Cardiac Enzymes: No results for input(s): CKTOTAL, CKMB, CKMBINDEX, TROPONINI in the last 168 hours. BNP (last 3 results) No results for input(s): PROBNP in the last 8760 hours. HbA1C: No results for input(s): HGBA1C in the last 72 hours. CBG: No results for input(s): GLUCAP in the last 168 hours. Lipid Profile: No results for input(s): CHOL, HDL, LDLCALC, TRIG, CHOLHDL, LDLDIRECT in the last 72 hours. Thyroid Function Tests: No results for input(s): TSH, T4TOTAL, FREET4, T3FREE,  THYROIDAB in the last 72 hours. Anemia Panel: No results for input(s): VITAMINB12, FOLATE, FERRITIN, TIBC, IRON, RETICCTPCT in the last 72 hours. Urine analysis:    Component Value Date/Time   COLORURINE YELLOW 08/12/2017 1822   APPEARANCEUR CLEAR 08/12/2017 1822   LABSPEC 1.012 08/12/2017 1822   PHURINE 5.0 08/12/2017 1822   GLUCOSEU NEGATIVE 08/12/2017 1822   HGBUR SMALL (A) 08/12/2017 1822   BILIRUBINUR NEGATIVE 08/12/2017 1822   KETONESUR NEGATIVE 08/12/2017 1822   PROTEINUR NEGATIVE 08/12/2017 1822   UROBILINOGEN 0.2 03/18/2015 1320   NITRITE NEGATIVE 08/12/2017 1822   LEUKOCYTESUR NEGATIVE 08/12/2017 1822   Sepsis Labs: @LABRCNTIP (procalcitonin:4,lacticidven:4) )No results found for this or any previous visit (from the past 240 hour(s)).   Radiological Exams on Admission: Ct Angio Head W Or Wo Contrast  Result Date: 08/12/2017 CLINICAL DATA:  Flu-like symptoms for 2 weeks with weakness. Headache. History of hypertension. EXAM: CT ANGIOGRAPHY HEAD TECHNIQUE: Multidetector CT imaging of the head was performed using the standard protocol during bolus administration of intravenous contrast. Multiplanar CT image reconstructions and MIPs were obtained to evaluate the vascular anatomy. CONTRAST:  ISOVUE-370 IOPAMIDOL (ISOVUE-370) INJECTION 76% COMPARISON:  MRI/MRA head March 18, 2015. CT HEAD March 18, 2015 FINDINGS: CT HEAD BRAIN: No intraparenchymal hemorrhage, mass effect nor midline shift. The ventricles and sulci are normal for age. Confluent supratentorial white matter hypodensities. Faint hypodensities bilateral basal ganglia and thalami associated with chronic small vessel ischemic disease. New nonacute antrum mesial RIGHT frontal lobe encephalomalacia. No acute large vascular territory infarcts. No abnormal extra-axial fluid collections. Basal cisterns are patent. VASCULAR: Mild calcific atherosclerosis of the carotid siphons. SKULL: No skull fracture. Mild RIGHT  temporomandibular osteoarthrosis. No significant scalp soft tissue swelling. SINUSES/ORBITS: Trace paranasal sinus mucosal thickening with small mucosal retention cyst. Trace bilateral mastoid effusions. Included ocular globes and orbital contents are non-suspicious. Status post bilateral ocular lens implants. OTHER: None. CTA HEAD ANTERIOR CIRCULATION: Patent cervical internal carotid arteries, petrous, cavernous and supra clinoid internal carotid arteries. Patent anterior communicating artery. Patent anterior and  middle cerebral arteries. Severe tandem stenosis LEFT A2 segment and severe stenosis RIGHT A3 segment. Moderate and severe tandem stenosis bilateral middle cerebral arteries. No large vessel occlusion,  contrast extravasation or aneurysm. POSTERIOR CIRCULATION: Patent vertebral arteries, vertebrobasilar junction and basilar artery, as well as main branch vessels. Patent posterior cerebral arteries. Scattered severe stenosis mid to distal posterior cerebral arteries. No large vessel occlusion, contrast extravasation or aneurysm. VENOUS SINUSES: Major dural venous sinuses are patent though not tailored for evaluation on this angiographic examination. ANATOMIC VARIANTS: None. DELAYED PHASE: No abnormal intracranial enhancement. MIP images reviewed. IMPRESSION: CT HEAD: 1. No acute intracranial process. 2. New nonacute small RIGHT frontal lobe/ACA territory infarct. 3. Progressed moderate to severe chronic small vessel ischemic disease. CTA HEAD: 1. No emergent large vessel occlusion. 2. Moderate atherosclerosis with superimposed multifocal severe stenosis bilateral anterior, middle and posterior cerebral arteries. Electronically Signed   By: Awilda Metro M.D.   On: 08/12/2017 19:25   Dg Chest 2 View  Result Date: 08/12/2017 CLINICAL DATA:  Weakness and flu like symptoms. EXAM: CHEST - 2 VIEW COMPARISON:  11/18/2013 and prior chest radiographs FINDINGS: This is a mildly low volume film. The  cardiomediastinal silhouette is unremarkable. Mild peribronchial thickening is unchanged. There is no evidence of focal airspace disease, pulmonary edema, suspicious pulmonary nodule/mass, pleural effusion, or pneumothorax. No acute bony abnormalities are identified. IMPRESSION: No active cardiopulmonary disease. Electronically Signed   By: Harmon Pier M.D.   On: 08/12/2017 17:36    EKG: Independently reviewed.  Normal sinus rhythm.  Assessment/Plan Principal Problem:   Acute CVA (cerebrovascular accident) [facility]) Active Problems:   CAD (coronary artery disease)    1. Possible CVA -patient symptoms are concerning for CVA.  Since patient has worsening slurred speech with mild weakness of the right upper extremity.  Discussed with neurologist.  Patient has already had CT angiogram of the head and neck which does not show any large vessel occlusion.  Will get MRI of the brain 2D echo check hemoglobin A1c lipid panel.  Patient is already on aspirin Plavix and statins which will be continued.  Swallow evaluation and neurochecks. 2. CAD status post stenting on aspirin Plavix statin and beta-blocker. 3. Hypertension on clonidine Vasotec and metoprolol.  Allow for permissive hypertension.   DVT prophylaxis: Lovenox. Code Status: Full code. Family Communication: Patient's husband and son. Disposition Plan: Home. Consults called: Neurologist. Admission status: Observation.   Eduard Clos MD [facility] Pager [phone].  If 7PM-7AM, please contact night-coverage [email or web address] Password [email or web address]  08/12/2017, 10:37 PM

## 2017-08-13 ENCOUNTER — Observation Stay (HOSPITAL_COMMUNITY): Payer: Medicare HMO

## 2017-08-13 ENCOUNTER — Observation Stay (HOSPITAL_BASED_OUTPATIENT_CLINIC_OR_DEPARTMENT_OTHER): Payer: Medicare HMO

## 2017-08-13 DIAGNOSIS — I6340 Cerebral infarction due to embolism of unspecified cerebral artery: Secondary | ICD-10-CM | POA: Diagnosis not present

## 2017-08-13 DIAGNOSIS — I63429 Cerebral infarction due to embolism of unspecified anterior cerebral artery: Secondary | ICD-10-CM | POA: Diagnosis present

## 2017-08-13 DIAGNOSIS — Z7982 Long term (current) use of aspirin: Secondary | ICD-10-CM | POA: Diagnosis not present

## 2017-08-13 DIAGNOSIS — Z7902 Long term (current) use of antithrombotics/antiplatelets: Secondary | ICD-10-CM | POA: Diagnosis not present

## 2017-08-13 DIAGNOSIS — I639 Cerebral infarction, unspecified: Secondary | ICD-10-CM | POA: Diagnosis not present

## 2017-08-13 DIAGNOSIS — E785 Hyperlipidemia, unspecified: Secondary | ICD-10-CM

## 2017-08-13 DIAGNOSIS — R040 Epistaxis: Secondary | ICD-10-CM | POA: Diagnosis not present

## 2017-08-13 DIAGNOSIS — Z86711 Personal history of pulmonary embolism: Secondary | ICD-10-CM | POA: Diagnosis not present

## 2017-08-13 DIAGNOSIS — I252 Old myocardial infarction: Secondary | ICD-10-CM | POA: Diagnosis not present

## 2017-08-13 DIAGNOSIS — Z8249 Family history of ischemic heart disease and other diseases of the circulatory system: Secondary | ICD-10-CM | POA: Diagnosis not present

## 2017-08-13 DIAGNOSIS — G9341 Metabolic encephalopathy: Secondary | ICD-10-CM | POA: Diagnosis present

## 2017-08-13 DIAGNOSIS — E1169 Type 2 diabetes mellitus with other specified complication: Secondary | ICD-10-CM

## 2017-08-13 DIAGNOSIS — R29702 NIHSS score 2: Secondary | ICD-10-CM | POA: Diagnosis present

## 2017-08-13 DIAGNOSIS — F17210 Nicotine dependence, cigarettes, uncomplicated: Secondary | ICD-10-CM | POA: Diagnosis present

## 2017-08-13 DIAGNOSIS — I2510 Atherosclerotic heart disease of native coronary artery without angina pectoris: Secondary | ICD-10-CM

## 2017-08-13 DIAGNOSIS — Z955 Presence of coronary angioplasty implant and graft: Secondary | ICD-10-CM | POA: Diagnosis not present

## 2017-08-13 DIAGNOSIS — R748 Abnormal levels of other serum enzymes: Secondary | ICD-10-CM | POA: Diagnosis present

## 2017-08-13 DIAGNOSIS — I10 Essential (primary) hypertension: Secondary | ICD-10-CM | POA: Diagnosis not present

## 2017-08-13 DIAGNOSIS — R531 Weakness: Secondary | ICD-10-CM

## 2017-08-13 DIAGNOSIS — Z86718 Personal history of other venous thrombosis and embolism: Secondary | ICD-10-CM | POA: Diagnosis not present

## 2017-08-13 DIAGNOSIS — R001 Bradycardia, unspecified: Secondary | ICD-10-CM | POA: Diagnosis present

## 2017-08-13 DIAGNOSIS — F17200 Nicotine dependence, unspecified, uncomplicated: Secondary | ICD-10-CM | POA: Diagnosis not present

## 2017-08-13 DIAGNOSIS — J45909 Unspecified asthma, uncomplicated: Secondary | ICD-10-CM | POA: Diagnosis present

## 2017-08-13 DIAGNOSIS — I5032 Chronic diastolic (congestive) heart failure: Secondary | ICD-10-CM

## 2017-08-13 DIAGNOSIS — N183 Chronic kidney disease, stage 3 (moderate): Secondary | ICD-10-CM | POA: Diagnosis present

## 2017-08-13 DIAGNOSIS — Z79899 Other long term (current) drug therapy: Secondary | ICD-10-CM | POA: Diagnosis not present

## 2017-08-13 DIAGNOSIS — R471 Dysarthria and anarthria: Secondary | ICD-10-CM

## 2017-08-13 DIAGNOSIS — I6389 Other cerebral infarction: Secondary | ICD-10-CM | POA: Diagnosis not present

## 2017-08-13 DIAGNOSIS — G8191 Hemiplegia, unspecified affecting right dominant side: Secondary | ICD-10-CM | POA: Diagnosis present

## 2017-08-13 DIAGNOSIS — I129 Hypertensive chronic kidney disease with stage 1 through stage 4 chronic kidney disease, or unspecified chronic kidney disease: Secondary | ICD-10-CM | POA: Diagnosis present

## 2017-08-13 LAB — ECHOCARDIOGRAM COMPLETE
Height: 62 in
Weight: 2321 oz

## 2017-08-13 LAB — ANTITHROMBIN III: AntiThromb III Func: 97 % (ref 75–120)

## 2017-08-13 MED ORDER — ASPIRIN EC 325 MG PO TBEC
325.0000 mg | DELAYED_RELEASE_TABLET | Freq: Every day | ORAL | Status: DC
  Administered 2017-08-13 – 2017-08-15 (×3): 325 mg via ORAL
  Filled 2017-08-13 (×3): qty 1

## 2017-08-13 NOTE — Progress Notes (Signed)
  Echocardiogram 2D Echocardiogram has been performed.  Mackenzie Richards 08/13/2017, 9:51 AM

## 2017-08-13 NOTE — Progress Notes (Signed)
Physical Therapy Evaluation Patient Details Name: Mackenzie Richards MRN: [id number] DOB: 1940-12-24 Today's Date: 08/13/2017   History of Present Illness  Ms. Mackenzie Richards is a pleasant 77 y/o female admitted on 08/12/17 due to persistent slurred speech and difficulty walking. CT with no acute findings. Patient with a PMH significant for CAD post stenting, HTN DVT, frequent UTI, diverticulitis, and MI.   Clinical Impression  Patient admitted with the above listed diagnosis. PTA, patient reports she was independent with ADLs and functional mobility. Patient motivated to work with PT this afternoon. Required Min Guard for transfers and gait with heavy VC for obstacle navigation and safety with AD with difficulty for straight line navigation. PT to continue to follow acutely to maximize functional mobility prior to d/c.     Follow Up Recommendations Home health PT;Supervision - Intermittent    Equipment Recommendations  Rolling walker with 5" wheels    Recommendations for Other Services       Precautions / Restrictions Precautions Precautions: Fall Restrictions Weight Bearing Restrictions: No      Mobility  Bed Mobility Overal bed mobility: Needs Assistance Bed Mobility: Supine to Sit     Supine to sit: Supervision     General bed mobility comments: for general safety  Transfers Overall transfer level: Needs assistance Equipment used: Rolling walker (2 wheeled) Transfers: Sit to/from Stand Sit to Stand: Min guard         General transfer comment: Min guard for immediate standing balance - minimal LOB upon standing  Ambulation/Gait Ambulation/Gait assistance: Min guard Ambulation Distance (Feet): 100 Feet Assistive device: Rolling walker (2 wheeled) Gait Pattern/deviations: Step-through pattern;Decreased stride length;Drifts right/left   Gait velocity interpretation: Below normal speed for age/gender General Gait Details: VC to remain close to AD for safety, as well as for  obstacle navigation as she easily drifts into wall  Stairs            Wheelchair Mobility    Modified Rankin (Stroke Patients Only) Modified Rankin (Stroke Patients Only) Pre-Morbid Rankin Score: Moderately severe disability Modified Rankin: Moderately severe disability     Balance Overall balance assessment: Needs assistance Sitting-balance support: No upper extremity supported;Feet supported Sitting balance-Leahy Scale: Fair     Standing balance support: Bilateral upper extremity supported;During functional activity Standing balance-Leahy Scale: Fair                               Pertinent Vitals/Pain Pain Assessment: No/denies pain    Home Living Family/patient expects to be discharged to:: Private residence Living Arrangements: Spouse/significant other;Children Available Help at Discharge: Family Type of Home: House Home Access: Stairs to enter Entrance Stairs-Rails: None Entrance Stairs-Number of Steps: 3 Home Layout: Multi-level;Able to live on main level with bedroom/bathroom Home Equipment: None      Prior Function Level of Independence: Independent               Hand Dominance   Dominant Hand: Right    Extremity/Trunk Assessment   Upper Extremity Assessment Upper Extremity Assessment: Defer to OT evaluation    Lower Extremity Assessment Lower Extremity Assessment: Generalized weakness       Communication   Communication: Other (comment)(slurred speech)  Cognition Arousal/Alertness: Awake/alert Behavior During Therapy: WFL for tasks assessed/performed Overall Cognitive Status: Within Functional Limits for tasks assessed  General Comments      Exercises     Assessment/Plan    PT Assessment Patient needs continued PT services  PT Problem List Decreased strength;Decreased activity tolerance;Decreased balance;Decreased mobility;Decreased knowledge of use of  DME;Decreased safety awareness       PT Treatment Interventions      PT Goals (Current goals can be found in the Care Plan section)  Acute Rehab PT Goals Patient Stated Goal: return home PT Goal Formulation: With patient Time For Goal Achievement: 08/27/17 Potential to Achieve Goals: Good    Frequency Min 4X/week   Barriers to discharge        Co-evaluation               AM-PAC PT "6 Clicks" Daily Activity  Outcome Measure Difficulty turning over in bed (including adjusting bedclothes, sheets and blankets)?: A Little Difficulty moving from lying on back to sitting on the side of the bed? : A Little Difficulty sitting down on and standing up from a chair with arms (e.g., wheelchair, bedside commode, etc,.)?: Unable Help needed moving to and from a bed to chair (including a wheelchair)?: A Little Help needed walking in hospital room?: A Little Help needed climbing 3-5 steps with a railing? : A Little 6 Click Score: 16    End of Session Equipment Utilized During Treatment: Gait belt Activity Tolerance: Patient tolerated treatment well Patient left: in bed;with call bell/phone within reach;with bed alarm set;with nursing/sitter in room Nurse Communication: Mobility status PT Visit Diagnosis: Unsteadiness on feet (R26.81);Other abnormalities of gait and mobility (R26.89);Muscle weakness (generalized) (M62.81);Difficulty in walking, not elsewhere classified (R26.2)    Time: 1610-9604 PT Time Calculation (min) (ACUTE ONLY): 17 min   Charges:   PT Evaluation $PT Eval Moderate Complexity: 1 Mod     PT G Codes:   PT G-Codes **NOT FOR INPATIENT CLASS** Functional Assessment Tool Used: AM-PAC 6 Clicks Basic Mobility Functional Limitation: Mobility: Walking and moving around Mobility: Walking and Moving Around Current Status (V4098): At least 40 percent but less than 60 percent impaired, limited or restricted Mobility: Walking and Moving Around Goal Status [phone]): At  least 20 percent but less than 40 percent impaired, limited or restricted    Kipp Laurence, PT, DPT 08/13/17 1:54 PM

## 2017-08-13 NOTE — Progress Notes (Deleted)
OT Cancellation Note  Patient Details Name: Mackenzie Richards MRN: [id number] DOB: [birth year]   Cancelled Treatment:    Reason Eval/Treat Not Completed: OT screened, no needs identified, will sign off. Spoke with PT who said that Pt was able to perform LB dressing, static standing balance, and has support from husband and children. If situation changes and new deficits occur, please feel free to re-order  Emelda Fear 08/13/2017, 1:57 PM  Sherryl Manges OTR/L [phone]

## 2017-08-13 NOTE — Progress Notes (Signed)
VASCULAR LAB PRELIMINARY  PRELIMINARY  PRELIMINARY  PRELIMINARY  Carotid duplex completed.    Preliminary report:  1-39% ICA stenosis.  >50% ECA stenosis.  Vertebral artery flow is antegrade.   Marquite Attwood, RVT 08/13/2017, 10:00 AM

## 2017-08-13 NOTE — Progress Notes (Signed)
PROGRESS NOTE  Mackenzie Richards:[id number] DOB: [birth year] DOA: 08/12/2017 PCP: Winifred Olive, MD  HPI/Recap of past 24 hours: Mackenzie Richards is a 77 y.o. female with history of CAD s/p PCI w stenting, hx of MI, hypertension was brought to the ER after patient was found to have increasing persistent slurred speech.  Patient symptoms started last few days ago with increasing generalized weakness and slurred speech. Brought to the ER with concerns for stroke. Admitted for stroke workup.  08/13/17: MRI revealed acute pontine infarct. Dysarthria somewhat improved. Neurology following. PT recommends HH PT. Denies palpitations, headaches, change in vision, numbness or one sided weakness.  Assessment/Plan: Principal Problem:   Acute CVA (cerebrovascular accident) [facility]) Active Problems:   CAD (coronary artery disease)  Acute left pontine CVA -MRI 08/13/17 revealed: 1. Acute left pontine infarct. 2. Tiny subacute right lateral lenticulostriate territory infarcts. -continue asa, statin -continue neurochecks \-fall precaution -last ldl 100 (11/15/16); goal less than 70 -last a1c 6.1 (11/15/16); goal less than 7.0 -continue PT -speech eval pending  CAD S/p PCI with stents -no chest pain -continue home meds  Tobacco use disorder -tobacco counseling at bedside -states she is not ready to quit tobacco use -states tried chantix w allergic reaction "making her cough blood"  Ambulatory dysfunction 2/2 to acute cVA -PT recs HHPT -fall precaution  CKD 3 -baseline cr 1.1 -cr 1.21 -avoid nephrotoxic agents/hypotension, dehydration  Elevated troponin most likely related to acute stroke -peaked at 0.10 -trending down -no chest pain  HLD -ldl 100 in 2018 -ldl ordered and pending -continue lipitor 80 mg daily  Grade 1 diastolic dysfunction EF 60-65% -no PFO -no acute issues -continue cardiac meds  Code Status: full  Family Communication: none at bedside  Disposition Plan: Home  after completes stroke work up   Consultants:  neurology   Procedures:  none  Antimicrobials:  none  DVT prophylaxis:  SCDs   Objective: Vitals:   08/13/17 0204 08/13/17 0400 08/13/17 0456 08/13/17 0825  BP: (!) 173/62 (!) 190/64 (!) 166/91 (!) 186/77  Pulse: 63 (!) 55  66  Resp: 18 18  18   Temp: 98.3 F (36.8 C) 98.3 F (36.8 C)  98.6 F (37 C)  TempSrc: Oral Oral  Oral  SpO2: 100% 100%  98%  Weight: 65.8 kg (145 lb 1 oz)     Height: 5\' 2"  (1.575 m)       Intake/Output Summary (Last 24 hours) at 08/13/2017 1145 Last data filed at 08/13/2017 0900 Gross per 24 hour  Intake 1240 ml  Output -  Net 1240 ml   Filed Weights   08/13/17 0204  Weight: 65.8 kg (145 lb 1 oz)    Exam:   General:  77 yo female WD WN NAD A&O x3   Cardiovascular: RRR no rubs or gallops  Respiratory: CTA no wheezes or rales   Abdomen: soft NT ND NBS x4  Musculoskeletal: no focal weakness noted  Skin: no rash  Psychiatry: mood is appropriate   Data Reviewed: CBC: Recent Labs  Lab 08/12/17 1611  WBC 7.8  NEUTROABS 3.8  HGB 14.4  HCT 44.1  MCV 103.3*  PLT 324   Basic Metabolic Panel: Recent Labs  Lab 08/12/17 1611  NA 141  K 4.1  CL 109  CO2 24  GLUCOSE 98  BUN 22*  CREATININE 1.21*  CALCIUM 8.8*   GFR: Estimated Creatinine Clearance: 35.8 mL/min (A) (by C-G formula based on SCr of 1.21 mg/dL (H)). Liver Function Tests:  Recent Labs  Lab 08/12/17 1611  AST 17  ALT 10*  ALKPHOS 78  BILITOT 0.7  PROT 7.0  ALBUMIN 3.7   No results for input(s): LIPASE, AMYLASE in the last 168 hours. No results for input(s): AMMONIA in the last 168 hours. Coagulation Profile: No results for input(s): INR, PROTIME in the last 168 hours. Cardiac Enzymes: No results for input(s): CKTOTAL, CKMB, CKMBINDEX, TROPONINI in the last 168 hours. BNP (last 3 results) No results for input(s): PROBNP in the last 8760 hours. HbA1C: No results for input(s): HGBA1C in the last 72  hours. CBG: No results for input(s): GLUCAP in the last 168 hours. Lipid Profile: No results for input(s): CHOL, HDL, LDLCALC, TRIG, CHOLHDL, LDLDIRECT in the last 72 hours. Thyroid Function Tests: No results for input(s): TSH, T4TOTAL, FREET4, T3FREE, THYROIDAB in the last 72 hours. Anemia Panel: No results for input(s): VITAMINB12, FOLATE, FERRITIN, TIBC, IRON, RETICCTPCT in the last 72 hours. Urine analysis:    Component Value Date/Time   COLORURINE YELLOW 08/12/2017 1822   APPEARANCEUR CLEAR 08/12/2017 1822   LABSPEC 1.012 08/12/2017 1822   PHURINE 5.0 08/12/2017 1822   GLUCOSEU NEGATIVE 08/12/2017 1822   HGBUR SMALL (A) 08/12/2017 1822   BILIRUBINUR NEGATIVE 08/12/2017 1822   KETONESUR NEGATIVE 08/12/2017 1822   PROTEINUR NEGATIVE 08/12/2017 1822   UROBILINOGEN 0.2 03/18/2015 1320   NITRITE NEGATIVE 08/12/2017 1822   LEUKOCYTESUR NEGATIVE 08/12/2017 1822   Sepsis Labs: @LABRCNTIP (procalcitonin:4,lacticidven:4)  )No results found for this or any previous visit (from the past 240 hour(s)).    Studies: Ct Angio Head W Or Wo Contrast  Result Date: 08/12/2017 CLINICAL DATA:  Flu-like symptoms for 2 weeks with weakness. Headache. History of hypertension. EXAM: CT ANGIOGRAPHY HEAD TECHNIQUE: Multidetector CT imaging of the head was performed using the standard protocol during bolus administration of intravenous contrast. Multiplanar CT image reconstructions and MIPs were obtained to evaluate the vascular anatomy. CONTRAST:  ISOVUE-370 IOPAMIDOL (ISOVUE-370) INJECTION 76% COMPARISON:  MRI/MRA head March 18, 2015. CT HEAD March 18, 2015 FINDINGS: CT HEAD BRAIN: No intraparenchymal hemorrhage, mass effect nor midline shift. The ventricles and sulci are normal for age. Confluent supratentorial white matter hypodensities. Faint hypodensities bilateral basal ganglia and thalami associated with chronic small vessel ischemic disease. New nonacute antrum mesial RIGHT frontal lobe  encephalomalacia. No acute large vascular territory infarcts. No abnormal extra-axial fluid collections. Basal cisterns are patent. VASCULAR: Mild calcific atherosclerosis of the carotid siphons. SKULL: No skull fracture. Mild RIGHT temporomandibular osteoarthrosis. No significant scalp soft tissue swelling. SINUSES/ORBITS: Trace paranasal sinus mucosal thickening with small mucosal retention cyst. Trace bilateral mastoid effusions. Included ocular globes and orbital contents are non-suspicious. Status post bilateral ocular lens implants. OTHER: None. CTA HEAD ANTERIOR CIRCULATION: Patent cervical internal carotid arteries, petrous, cavernous and supra clinoid internal carotid arteries. Patent anterior communicating artery. Patent anterior and middle cerebral arteries. Severe tandem stenosis LEFT A2 segment and severe stenosis RIGHT A3 segment. Moderate and severe tandem stenosis bilateral middle cerebral arteries. No large vessel occlusion,  contrast extravasation or aneurysm. POSTERIOR CIRCULATION: Patent vertebral arteries, vertebrobasilar junction and basilar artery, as well as main branch vessels. Patent posterior cerebral arteries. Scattered severe stenosis mid to distal posterior cerebral arteries. No large vessel occlusion, contrast extravasation or aneurysm. VENOUS SINUSES: Major dural venous sinuses are patent though not tailored for evaluation on this angiographic examination. ANATOMIC VARIANTS: None. DELAYED PHASE: No abnormal intracranial enhancement. MIP images reviewed. IMPRESSION: CT HEAD: 1. No acute intracranial process. 2. New nonacute small  RIGHT frontal lobe/ACA territory infarct. 3. Progressed moderate to severe chronic small vessel ischemic disease. CTA HEAD: 1. No emergent large vessel occlusion. 2. Moderate atherosclerosis with superimposed multifocal severe stenosis bilateral anterior, middle and posterior cerebral arteries. Electronically Signed   By: Awilda Metro M.D.   On:  08/12/2017 19:25   Dg Chest 2 View  Result Date: 08/12/2017 CLINICAL DATA:  Weakness and flu like symptoms. EXAM: CHEST - 2 VIEW COMPARISON:  11/18/2013 and prior chest radiographs FINDINGS: This is a mildly low volume film. The cardiomediastinal silhouette is unremarkable. Mild peribronchial thickening is unchanged. There is no evidence of focal airspace disease, pulmonary edema, suspicious pulmonary nodule/mass, pleural effusion, or pneumothorax. No acute bony abnormalities are identified. IMPRESSION: No active cardiopulmonary disease. Electronically Signed   By: Harmon Pier M.D.   On: 08/12/2017 17:36   Mr Brain Wo Contrast  Result Date: 08/13/2017 CLINICAL DATA:  Weakness and slurred speech. EXAM: MRI HEAD WITHOUT CONTRAST TECHNIQUE: Multiplanar, multiecho pulse sequences of the brain and surrounding structures were obtained without intravenous contrast. COMPARISON:  Head CT 08/12/2017 and MRI 03/18/2015 FINDINGS: Brain: There is a small acute left paramedian pontine infarct. There are also punctate foci of trace diffusion signal hyperintensity in the right lateral lenticulostriate territory extending from the superior aspect of the right lentiform nucleus to the caudate body with normal ADC suggesting subacute infarcts. No intracranial hemorrhage, mass, midline shift, or extra-axial fluid collection is identified. A small chronic anteromedial right frontal lobe infarct is new from 2016. Patchy to confluent T2 hyperintensities elsewhere in the cerebral white matter bilaterally and in the pons have slightly progressed from 2016 and are nonspecific but compatible with moderate chronic small vessel ischemic disease. There are multiple chronic lacunar infarcts in the basal ganglia and thalami which are new from 2016. Generalized cerebral atrophy is stable to slightly progressed from 2016. Vascular: Major intracranial vascular flow voids are preserved. Skull and upper cervical spine: Unremarkable bone marrow  signal. Sinuses/Orbits: Bilateral cataract extraction. Trace right mastoid effusion. Clear paranasal sinuses. Other: None. IMPRESSION: 1. Acute left pontine infarct. 2. Tiny subacute right lateral lenticulostriate territory infarcts. 3. Chronic anterior right frontal infarct, new from 2016. 4. Progressive chronic small vessel ischemia from 2016. Electronically Signed   By: Sebastian Ache M.D.   On: 08/13/2017 07:17    Scheduled Meds: .  stroke: mapping our early stages of recovery book   Does not apply Once  . aspirin EC  325 mg Oral Daily  . atorvastatin  80 mg Oral q1800  . cloNIDine  0.1 mg Oral Daily  . clopidogrel  75 mg Oral Q breakfast  . enalapril  20 mg Oral Daily  . enoxaparin (LOVENOX) injection  40 mg Subcutaneous Q24H  . metoprolol tartrate  12.5 mg Oral BID  . potassium chloride SA  20 mEq Oral Daily    Continuous Infusions: . sodium chloride       LOS: 0 days     Darlin Drop, MD [facility] Pager [phone]  If 7PM-7AM, please contact night-coverage [email or web address] Password [email or web address] 08/13/2017, 11:45 AM

## 2017-08-13 NOTE — Progress Notes (Signed)
Patient states, "I want my independence, do not touch me or put the alarms on my bed".  Fall safety reviewed with patient and her family present at bedside. Patient agreed to stand by of the nursing staff.  Has become increasingly agitated this evening; family is concerned she has lack of sleep.  Continue to monitor for safety.

## 2017-08-13 NOTE — Consult Note (Signed)
Neurology Consultation  Reason for Consult: Stroke Referring Physician: Dr. Estell Harpin  CC: Slurred speech right-sided weakness  History is obtained from: Patient, chart  HPI: Mackenzie Richards is a 77 y.o. female past medical history of hypertension, coronary artery disease, tobacco abuse who was in her usual state of health last known normal anywhere from 2-7 days ago when her husband started noticing that she has slurred speech.  She also noticed some right-sided weakness at that time.  She has been having difficulty walking.  The symptoms became acutely worse in the last 24 hours that prompted the visit to the emergency room. A noncontrast CT of the head was done that did not show any emergent findings that could explain the current symptoms.  She was admitted for stroke workup given the dysarthria and the right-sided weakness. She is not a very good historian and cannot tell me her definitive last known normal.  Her husband was not at bedside at the time of this encounter. She denies any chest pain, shortness of breath, palpitations. Denies any tingling or numbness.  Denies any visual symptoms.  Denies any headache. Denies any nausea or vomiting. Currently she is on aspirin and Plavix since her cardiac stent placement a year ago.  She reports compliance.  LKW: Anywhere from 2-7 days prior to presentation tpa given?: no, outside the window Premorbid modified Rankin scale (mRS): 0-1  ROS: ROS was performed and is negative except as noted in the HPI   Past Medical History:  Diagnosis Date  . Arthritis   . Asthma   . Bowen's disease   . Breast cyst   . Complication of anesthesia    i AM SLOW TO WAKE UP   . Coronary artery disease   . Diverticulitis   . DVT (deep venous thrombosis) (HCC)   . Frequent UTI   . Hypertension   . Kidney cysts   . Myocardial infarction [facility]) 11/2016    Family History  Problem Relation Age of Onset  . Hypertension Other     Social History:   reports  that she has been smoking cigarettes.  She has been smoking about 1.00 pack per day. she has never used smokeless tobacco. She reports that she does not drink alcohol or use drugs.  Medications  Current Facility-Administered Medications:  .   stroke: mapping our early stages of recovery book, , Does not apply, Once, Eduard Clos, MD .  0.9 %  sodium chloride infusion, , Intravenous, Continuous, Eduard Clos, MD .  aspirin chewable tablet 81 mg, 81 mg, Oral, Daily, Eduard Clos, MD .  atorvastatin (LIPITOR) tablet 80 mg, 80 mg, Oral, q1800, Eduard Clos, MD .  cloNIDine (CATAPRES) tablet 0.1 mg, 0.1 mg, Oral, See admin instructions, Eduard Clos, MD .  clopidogrel (PLAVIX) tablet 75 mg, 75 mg, Oral, Q breakfast, Eduard Clos, MD .  enalapril (VASOTEC) tablet 20 mg, 20 mg, Oral, Daily, Eduard Clos, MD .  enoxaparin (LOVENOX) injection 40 mg, 40 mg, Subcutaneous, Q24H, Eduard Clos, MD .  metoprolol tartrate (LOPRESSOR) tablet 12.5 mg, 12.5 mg, Oral, BID, Eduard Clos, MD .  nitroGLYCERIN (NITROSTAT) SL tablet 0.4 mg, 0.4 mg, Sublingual, Q5 min PRN, Eduard Clos, MD .  potassium chloride SA (K-DUR,KLOR-CON) CR tablet 20 mEq, 20 mEq, Oral, Daily, Midge Minium N, MD .  sodium chloride 0.9 % injection, , , ,  .  zolpidem (AMBIEN) tablet 10 mg, 10 mg, Oral, QHS PRN, Toniann Fail,  Meryle Ready, MD  Current Outpatient Medications:  .  Acetaminophen 500 MG coapsule, Take 1,000 mg by mouth every 6 (six) hours as needed for moderate pain or headache. , Disp: , Rfl:  .  aspirin 81 MG chewable tablet, Chew 1 tablet (81 mg total) by mouth daily., Disp: 30 tablet, Rfl:  .  atorvastatin (LIPITOR) 80 MG tablet, Take 1 tablet (80 mg total) by mouth daily at 6 PM., Disp: 30 tablet, Rfl: 1 .  cloNIDine (CATAPRES) 0.1 MG tablet, Take 0.1 mg by mouth See admin instructions. Take 0.1 mg by mouth daily. May take 0.1 mg additional if  needed, Disp: , Rfl:  .  clopidogrel (PLAVIX) 75 MG tablet, Take 1 tablet (75 mg total) by mouth daily with breakfast., Disp: 30 tablet, Rfl: 6 .  enalapril (VASOTEC) 20 MG tablet, Take 1 tablet (20 mg total) by mouth daily., Disp: 30 tablet, Rfl: 2 .  metoprolol tartrate (LOPRESSOR) 25 MG tablet, TAKE 1/2  TABLET BY MOUTH TWICE DAILY, Disp: 90 tablet, Rfl: 2 .  potassium chloride SA (K-DUR,KLOR-CON) 20 MEQ tablet, Take 1 tablet (20 mEq total) by mouth daily., Disp: 3 tablet, Rfl: 0 .  zolpidem (AMBIEN) 10 MG tablet, Take 10 mg by mouth at bedtime. , Disp: , Rfl:  .  ciprofloxacin (CIPRO) 500 MG tablet, Take 1 tablet (500 mg total) by mouth 2 (two) times daily. One po bid x 7 days (Patient not taking: Reported on 08/12/2017), Disp: 14 tablet, Rfl: 0 .  metroNIDAZOLE (FLAGYL) 500 MG tablet, Take 1 tablet (500 mg total) by mouth 2 (two) times daily. (Patient not taking: Reported on 08/12/2017), Disp: 14 tablet, Rfl: 0 .  nitroGLYCERIN (NITROSTAT) 0.4 MG SL tablet, Place 1 tablet (0.4 mg total) under the tongue every 5 (five) minutes as needed., Disp: 25 tablet, Rfl: 2  Exam: Current vital signs: BP (!) 143/48   Pulse (!) 52   Temp 97.8 F (36.6 C)   Resp 18   SpO2 93%  Vital signs in last 24 hours: Temp:  [97.8 F (36.6 C)-97.9 F (36.6 C)] 97.8 F (36.6 C) (03/08 2121) Pulse Rate:  [48-59] 52 (03/09 0030) Resp:  [13-21] 18 (03/09 0030) BP: (131-173)/(48-95) 143/48 (03/09 0030) SpO2:  [93 %-99 %] 93 % (03/09 0030)  GENERAL: Awake, alert in NAD HEENT: - Normocephalic and atraumatic, dry mm, no LN++, no Thyromegally LUNGS - Clear to auscultation bilaterally with no wheezes CV - S1S2 RRR, no m/r/g, equal pulses bilaterally. ABDOMEN - Soft, nontender, nondistended with normoactive BS Ext: warm, well perfused, intact peripheral pulses, no edema  NEURO:  Mental Status: AA&Ox3  Language: speech is moderately dysarthric.  Naming, repetition, fluency, and comprehension intact. Cranial Nerves:  PERRL. EOMI, visual fields full, no facial asymmetry, facial sensation intact, hearing intact, tongue/uvula/soft palate midline, normal sternocleidomastoid and trapezius muscle strength. No evidence of tongue atrophy or fibrillations Motor: RUE 4+/5, no vertical drift, right lower extremity 4+/5 no vertical drift.  Left upper and lower extremity 5/5 with no vertical drift. Tone: is normal and bulk is normal Sensation- Intact to light touch bilaterally Coordination: FTN intact bilaterally Gait- deferred  NIHSS - 2 for dysarthria  Labs I have reviewed labs in epic and the results pertinent to this consultation are:  CBC    Component Value Date/Time   WBC 7.8 08/12/2017 1611   RBC 4.27 08/12/2017 1611   HGB 14.4 08/12/2017 1611   HCT 44.1 08/12/2017 1611   PLT 324 08/12/2017 1611   MCV  103.3 (H) 08/12/2017 1611   MCH 33.7 08/12/2017 1611   MCHC 32.7 08/12/2017 1611   RDW 14.5 08/12/2017 1611   LYMPHSABS 2.9 08/12/2017 1611   MONOABS 0.8 08/12/2017 1611   EOSABS 0.3 08/12/2017 1611   BASOSABS 0.1 08/12/2017 1611    CMP     Component Value Date/Time   NA 141 08/12/2017 1611   K 4.1 08/12/2017 1611   CL 109 08/12/2017 1611   CO2 24 08/12/2017 1611   GLUCOSE 98 08/12/2017 1611   BUN 22 (H) 08/12/2017 1611   CREATININE 1.21 (H) 08/12/2017 1611   CALCIUM 8.8 (L) 08/12/2017 1611   PROT 7.0 08/12/2017 1611   ALBUMIN 3.7 08/12/2017 1611   AST 17 08/12/2017 1611   ALT 10 (L) 08/12/2017 1611   ALKPHOS 78 08/12/2017 1611   BILITOT 0.7 08/12/2017 1611   GFRNONAA 43 (L) 08/12/2017 1611   GFRAA 49 (L) 08/12/2017 1611    Imaging I have reviewed the images obtained:  CT-scan of the brain -no acute changes.  A new area of hypodensity in the right MCA ACA territory compared to 2016.  CTA H+N: No emergent LVO.  Assessment:  77 year old woman with a multiple day history of slurred speech and right-sided weakness that has now progressed to difficulty walking because of the  right-sided weakness came in for evaluation of the symptoms. She is outside the window for TPA or endovascular treatment and her examination is also not suggestive of a LVO. Given her risk factors, she probably has suffered a lacunar stroke -causing dysarthria and right-sided weakness. She has been admitted for stroke workup.   Impression: Acute ischemic stroke-likely small vessel etiology Hypertension Tobacco abuse  Recommendations: -Admit to hospitalist -Telemetry monitoring -Allow for permissive hypertension for the first 24-48h - only treat PRN if SBP >220 mmHg. Blood pressures can be gradually normalized to SBP<140 upon discharge. -MRI brain without contrast - might need sedation due to claustrophobia -Echocardiogram -HgbA1c, fasting lipid panel -Frequent neuro checks -Prophylactic therapy-Antiplatelet med: Aspirin+Plavix - continue home regimen -Atorvastatin 80 mg PO daily -Risk factor modification -I discussed the importance of exercise as well as smoking/alcohol/illicit drug use cessation. -PT consult, OT consult, Speech consult  Please page stroke NP/PA/MD (listed on AMION)  from 8am-4 pm as this patient will be followed by the stroke team at this point.  -- Milon Dikes, MD [facility] Pager: [phone] If 7pm to 7am, please call on call as listed on AMION.

## 2017-08-13 NOTE — ED Notes (Signed)
[facility] called for transport. 

## 2017-08-13 NOTE — Care Management Obs Status (Signed)
MEDICARE OBSERVATION STATUS NOTIFICATION   Patient Details  Name: Mackenzie Richards MRN: [id number] Date of Birth: Dec 06, 1940   Medicare Observation Status Notification Given:  Yes    Deveron Furlong, RN 08/13/2017, 2:55 PM

## 2017-08-13 NOTE — Progress Notes (Signed)
OT Brief Note - Full Evaluation to come later  Patient Details Name: Mackenzie Richards MRN: [id number] DOB: December 30, 1940  Evaluation complete. At this time, I am recommending for OT to follow acutely with no OT follow up, but 24 hour supervision from husband and sons for assist. Pt will require shower chair for DME.  Evern Bio Zarie Kosiba 08/13/2017, 5:54 PM  Sherryl Manges OTR/L [phone]

## 2017-08-13 NOTE — Progress Notes (Signed)
STROKE TEAM PROGRESS NOTE   SUBJECTIVE (INTERVAL HISTORY) Her family is not at the bedside.  Pt sitting in bed, still has mild dysarthria but right side weakness now resolved. 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 All occurred after each time right leg surgery. Once started prophylaxis treatment prior to surgery, she had no more DVT. She was on anticoagulation for a year and then stopped. She has strong family hx of DVT. She had no DVT since. She is current smoker but willing to quit.    OBJECTIVE Temp:  [97.8 F (36.6 C)-98.6 F (37 C)] 98.6 F (37 C) (03/09 0825) Pulse Rate:  [48-66] 66 (03/09 0825) Cardiac Rhythm: Sinus bradycardia (03/09 0243) Resp:  [13-21] 18 (03/09 0825) BP: (131-190)/(48-95) 186/77 (03/09 0825) SpO2:  [93 %-100 %] 98 % (03/09 0825) Weight:  [145 lb 1 oz (65.8 kg)] 145 lb 1 oz (65.8 kg) (03/09 0204)  CBC:  Recent Labs  Lab 08/12/17 1611  WBC 7.8  NEUTROABS 3.8  HGB 14.4  HCT 44.1  MCV 103.3*  PLT 324    Basic Metabolic Panel:  Recent Labs  Lab 08/12/17 1611  NA 141  K 4.1  CL 109  CO2 24  GLUCOSE 98  BUN 22*  CREATININE 1.21*  CALCIUM 8.8*    Lipid Panel:     Component Value Date/Time   CHOL 144 11/15/2016 1720   TRIG 106 11/15/2016 1720   HDL 23 (L) 11/15/2016 1720   CHOLHDL 6.3 11/15/2016 1720   VLDL 21 11/15/2016 1720   LDLCALC 100 (H) 11/15/2016 1720   HgbA1c:  Lab Results  Component Value Date   HGBA1C 6.1 (H) 11/15/2016   Urine Drug Screen:     Component Value Date/Time   LABOPIA NONE DETECTED 08/12/2017 1822   COCAINSCRNUR NONE DETECTED 08/12/2017 1822   LABBENZ NONE DETECTED 08/12/2017 1822   AMPHETMU NONE DETECTED 08/12/2017 1822   THCU NONE DETECTED 08/12/2017 1822   LABBARB NONE DETECTED 08/12/2017 1822    Alcohol Level     Component Value Date/Time   ETH <5 03/18/2015 1253    IMAGING I have personally reviewed the radiological images below and agree with the radiology  interpretations.  Ct Angio Head W Or Wo Contrast 08/12/2017 IMPRESSION:  CT HEAD:  1. No acute intracranial process.  2. New nonacute small RIGHT frontal lobe/ACA territory infarct.  3. Progressed moderate to severe chronic small vessel ischemic disease.  CTA HEAD:  1. No emergent large vessel occlusion.  2. Moderate atherosclerosis with superimposed multifocal severe stenosis bilateral anterior, middle and posterior cerebral arteries.  Mr Brain Wo Contrast 08/13/2017 IMPRESSION:  1. Acute left pontine infarct.  2. Tiny subacute right lateral lenticulostriate territory infarcts.  3. Chronic anterior right frontal infarct, new from 2016.  4. Progressive chronic small vessel ischemia from 2016.   Transthoracic Echocardiogram - Left ventricle: The cavity size was normal. Wall thickness was   increased in a pattern of mild LVH. Systolic function was normal.   The estimated ejection fraction was in the range of 60% to 65%.   Wall motion was normal; there were no regional wall motion   abnormalities. Doppler parameters are consistent with abnormal   left ventricular relaxation (grade 1 diastolic dysfunction). - Aortic valve: Valve area (VTI): 1.9 cm^2. Valve area (Vmax): 2.01   cm^2. Valve area (Vmean): 1.62 cm^2. - Atrial septum: No defect or patent foramen ovale was identified. - Pulmonary arteries: Systolic  pressure was mildly increased. PA   peak pressure: 36 mm Hg (S).  Bilateral Carotid Dopplers 1-39% ICA stenosis.  >50% ECA stenosis.  Vertebral artery flow is antegrade.   LE venous doppler - pending   PHYSICAL EXAM Vitals:   08/13/17 0204 08/13/17 0400 08/13/17 0456 08/13/17 0825  BP: (!) 173/62 (!) 190/64 (!) 166/91 (!) 186/77  Pulse: 63 (!) 55  66  Resp: 18 18  18   Temp: 98.3 F (36.8 C) 98.3 F (36.8 C)  98.6 F (37 C)  TempSrc: Oral Oral  Oral  SpO2: 100% 100%  98%  Weight: 145 lb 1 oz (65.8 kg)     Height: 5\' 2"  (1.575 m)       Temp:  [97.7 F (36.5 C)-98.6  F (37 C)] 98.1 F (36.7 C) (03/09 1546) Pulse Rate:  [50-75] 58 (03/09 1546) Resp:  [16-21] 18 (03/09 1546) BP: (140-190)/(48-95) 178/76 (03/09 1546) SpO2:  [93 %-100 %] 98 % (03/09 1546) Weight:  [145 lb 1 oz (65.8 kg)] 145 lb 1 oz (65.8 kg) (03/09 0204)  General - Well nourished, well developed, in no apparent distress.  Ophthalmologic - Fundi not visualized due to .  Cardiovascular - Regular rate and rhythm.  Mental Status -  Level of arousal and orientation to time, place, and person were intact. Language including expression, naming, repetition, comprehension was assessed and found intact. Fund of Knowledge was assessed and was intact.  Cranial Nerves II - XII - II - Visual field intact OU. III, IV, VI - Extraocular movements intact. V - Facial sensation intact bilaterally. VII - Facial movement intact bilaterally. VIII - Hearing & vestibular intact bilaterally. X - Palate elevates symmetrically, mild dysarthria. XI - Chin turning & shoulder shrug intact bilaterally. XII - Tongue protrusion intact.  Motor Strength - The patient's strength was normal in all extremities and pronator drift was absent.  Bulk was normal and fasciculations were absent.   Motor Tone - Muscle tone was assessed at the neck and appendages and was normal.  Reflexes - The patient's reflexes were 1+ in all extremities and she had no pathological reflexes.  Sensory - Light touch, temperature/pinprick were assessed and were symmetrical.    Coordination - The patient had normal movements in the hands with no ataxia or dysmetria.  Tremor was absent.  Gait and Station - deferred    ASSESSMENT/PLAN Mackenzie Richards is a 77 y.o. female with history of hypertension, coronary artery disease, and tobacco abuse presenting with slurred speech, difficulty walking, and right-sided weakness. She did not receive IV t-PA due to late presentation.  Stroke: Acute left pontine infarct, right BG/CR 3 punctate  infarcts, embolic pattern, etiology unclear.   Resultant  Mild dysarthria  CT head - No acute intracranial process. Right frontal small old infarct.   MRI head - Acute left pontine infarct. Tiny subacute right lateral lenticulostriate territory infarcts.   CTA Head - diffuse athero b/l MCA, PCA and ACA  Carotid Doppler unremarkable  2D Echo EF 60-65%  LE venous doppler pending  Need TEE and loop for cardioembolic work up given multiple infarcts as well as old infarct at right frontal  LDL - pending  HgbA1c - pending  hypercoagulable labs pending  VTE prophylaxis - Lovenox Fall precautions Diet Heart Room service appropriate? Yes; Fluid consistency: Thin  aspirin 81 mg daily and clopidogrel 75 mg daily prior to admission, now on aspirin 325 mg daily and clopidogrel 75 mg daily due to stroke and  cardiac prevention.   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   All happened within a year  All happened after leg surgery  On anticoagulation for a year  No more DVT since  Strong family hx of DVT including mom  Hypercoagulable work up pending  LE venous doppler pending   levonox prophylasix  Mild epistaxis   On DAPT at home  Has very mild epistaxis  Continue monitoring  CAD   On home DAPT  S/p stent  Stable   Trop 0.11->0.09  Hypertension  Stable but tends to run high  Permissive hypertension (OK if < 220/120) but gradually normalize in 5-7 days  On clonidine and enalapril and metoprolol  Long-term BP goal normotensive  Hyperlipidemia  Home meds:  Lipitor 80 mg daily resumed in hospital  LDL pending, goal < 70  Continue statin at discharge  Tobacco abuse  Current smoker  Smoking cessation counseling provided  Pt is willing to quit  Other Stroke Risk  Factors  Advanced age  Hx of stroke on imaging - Chronic anterior right frontal infarct   Other Active Problems  Elevated Creatinine 1.21   Hospital day # 0  Marvel Plan, MD PhD Stroke Neurology 08/13/2017 6:03 PM   To contact Stroke Continuity provider, please refer to [email or web address]. After hours, contact General Neurology

## 2017-08-13 NOTE — Care Management Note (Signed)
Case Management Note  Patient Details  Name: Mackenzie Richards MRN: [id number] Date of Birth: [birth year]  Subjective/Objective:       Pt presented for right-sided weakness and speech disturbance for several days and diagnosed with acute CVA.  PT recommending HH PT.  Awaiting other therapy consults.    Pt from home with husband and was independent without equipment, including driving PTA.  Pt states there were no barriers to healthcare.  She has a walker available if needed.  She will check with a friend about a [facility] agency that she may prefer.       Action/Plan: Will follow for D/C needs.   Expected Discharge Date:                  Expected Discharge Plan:  Home w Home Health Services  In-House Referral:  NA  Discharge planning Services  CM Consult  Post Acute Care Choice:    Choice offered to:     DME Arranged:    DME Agency:     HH Arranged:    HH Agency:     Status of Service:  In process, will continue to follow  If discussed at Long Length of Stay Meetings, dates discussed:    Additional Comments:  Deveron Furlong, RN 08/13/2017, 3:19 PM

## 2017-08-13 NOTE — ED Notes (Signed)
[facility] at bedside to transport patient  

## 2017-08-14 ENCOUNTER — Inpatient Hospital Stay (HOSPITAL_COMMUNITY): Payer: Medicare HMO

## 2017-08-14 ENCOUNTER — Other Ambulatory Visit: Payer: Self-pay

## 2017-08-14 DIAGNOSIS — I6340 Cerebral infarction due to embolism of unspecified cerebral artery: Secondary | ICD-10-CM

## 2017-08-14 DIAGNOSIS — F17200 Nicotine dependence, unspecified, uncomplicated: Secondary | ICD-10-CM

## 2017-08-14 DIAGNOSIS — I10 Essential (primary) hypertension: Secondary | ICD-10-CM

## 2017-08-14 LAB — PROTEIN C ACTIVITY: PROTEIN C ACTIVITY: 134 % (ref 73–180)

## 2017-08-14 LAB — PROTEIN S ACTIVITY: PROTEIN S ACTIVITY: 122 % (ref 63–140)

## 2017-08-14 LAB — PROTEIN S, TOTAL: Protein S Ag, Total: 133 % (ref 60–150)

## 2017-08-14 MED ORDER — HALOPERIDOL LACTATE 5 MG/ML IJ SOLN
5.0000 mg | Freq: Once | INTRAMUSCULAR | Status: AC
  Administered 2017-08-14: 5 mg via INTRAVENOUS
  Filled 2017-08-14: qty 1

## 2017-08-14 NOTE — Progress Notes (Signed)
STROKE TEAM PROGRESS NOTE   SUBJECTIVE (INTERVAL HISTORY) Her husband is not at the bedside.  Pt was reported sundowning last night and wandering around in the hallway. However, pt denied this am and said she just wanted to walk around and she was tired in bed. No acute event overnight.    OBJECTIVE Temp:  [97.7 F (36.5 C)-98.7 F (37.1 C)] 98.7 F (37.1 C) (03/10 0030) Pulse Rate:  [58-75] 67 (03/10 0030) Cardiac Rhythm: Normal sinus rhythm (03/09 1900) Resp:  [18] 18 (03/10 0030) BP: (177-208)/(76-125) 208/125 (03/10 0030) SpO2:  [91 %-98 %] 91 % (03/10 0030)  CBC:  Recent Labs  Lab 08/12/17 1611  WBC 7.8  NEUTROABS 3.8  HGB 14.4  HCT 44.1  MCV 103.3*  PLT 324    Basic Metabolic Panel:  Recent Labs  Lab 08/12/17 1611  NA 141  K 4.1  CL 109  CO2 24  GLUCOSE 98  BUN 22*  CREATININE 1.21*  CALCIUM 8.8*    Lipid Panel:     Component Value Date/Time   CHOL 144 11/15/2016 1720   TRIG 106 11/15/2016 1720   HDL 23 (L) 11/15/2016 1720   CHOLHDL 6.3 11/15/2016 1720   VLDL 21 11/15/2016 1720   LDLCALC 100 (H) 11/15/2016 1720   HgbA1c:  Lab Results  Component Value Date   HGBA1C 6.1 (H) 11/15/2016   Urine Drug Screen:     Component Value Date/Time   LABOPIA NONE DETECTED 08/12/2017 1822   COCAINSCRNUR NONE DETECTED 08/12/2017 1822   LABBENZ NONE DETECTED 08/12/2017 1822   AMPHETMU NONE DETECTED 08/12/2017 1822   THCU NONE DETECTED 08/12/2017 1822   LABBARB NONE DETECTED 08/12/2017 1822    Alcohol Level     Component Value Date/Time   ETH <5 03/18/2015 1253    IMAGING I have personally reviewed the radiological images below and agree with the radiology interpretations.  Ct Angio Head W Or Wo Contrast 08/12/2017 IMPRESSION:  CT HEAD:  1. No acute intracranial process.  2. New nonacute small RIGHT frontal lobe/ACA territory infarct.  3. Progressed moderate to severe chronic small vessel ischemic disease.  CTA HEAD:  1. No emergent large vessel  occlusion.  2. Moderate atherosclerosis with superimposed multifocal severe stenosis bilateral anterior, middle and posterior cerebral arteries.  Mr Brain Wo Contrast 08/13/2017 IMPRESSION:  1. Acute left pontine infarct.  2. Tiny subacute right lateral lenticulostriate territory infarcts.  3. Chronic anterior right frontal infarct, new from 2016.  4. Progressive chronic small vessel ischemia from 2016.   Transthoracic Echocardiogram - Left ventricle: The cavity size was normal. Wall thickness was   increased in a pattern of mild LVH. Systolic function was normal.   The estimated ejection fraction was in the range of 60% to 65%.   Wall motion was normal; there were no regional wall motion   abnormalities. Doppler parameters are consistent with abnormal   left ventricular relaxation (grade 1 diastolic dysfunction). - Aortic valve: Valve area (VTI): 1.9 cm^2. Valve area (Vmax): 2.01   cm^2. Valve area (Vmean): 1.62 cm^2. - Atrial septum: No defect or patent foramen ovale was identified. - Pulmonary arteries: Systolic pressure was mildly increased. PA   peak pressure: 36 mm Hg (S).  Bilateral Carotid Dopplers 1-39% ICA stenosis.  >50% ECA stenosis.  Vertebral artery flow is antegrade.   LE venous doppler - no DVT   PHYSICAL EXAM Vitals:   08/13/17 1223 08/13/17 1546 08/13/17 2004 08/14/17 0030  BP: (!) 177/84 (!) 178/76 Marland Kitchen)  206/83 (!) 208/125  Pulse: 75 (!) 58 64 67  Resp: 18 18 18 18   Temp: 97.7 F (36.5 C) 98.1 F (36.7 C) 98.1 F (36.7 C) 98.7 F (37.1 C)  TempSrc: Oral Oral Oral Oral  SpO2: 98% 98% 94% 91%  Weight:      Height:        Temp:  [97.7 F (36.5 C)-98.7 F (37.1 C)] 98.7 F (37.1 C) (03/10 0030) Pulse Rate:  [58-75] 67 (03/10 0030) Resp:  [18] 18 (03/10 0030) BP: (177-208)/(76-125) 208/125 (03/10 0030) SpO2:  [91 %-98 %] 91 % (03/10 0030)  General - Well nourished, well developed, in no apparent distress.  Ophthalmologic - Fundi not visualized due  to .  Cardiovascular - Regular rate and rhythm.  Mental Status -  Level of arousal and orientation to time, place, and person were intact. Language including expression, naming, repetition, comprehension was assessed and found intact. Fund of Knowledge was assessed and was intact.  Cranial Nerves II - XII - II - Visual field intact OU. III, IV, VI - Extraocular movements intact. V - Facial sensation intact bilaterally. VII - Facial movement intact bilaterally. VIII - Hearing & vestibular intact bilaterally. X - Palate elevates symmetrically, mild dysarthria. XI - Chin turning & shoulder shrug intact bilaterally. XII - Tongue protrusion intact.  Motor Strength - The patient's strength was normal in all extremities and pronator drift was absent.  Bulk was normal and fasciculations were absent.   Motor Tone - Muscle tone was assessed at the neck and appendages and was normal.  Reflexes - The patient's reflexes were 1+ in all extremities and she had no pathological reflexes.  Sensory - Light touch, temperature/pinprick were assessed and were symmetrical.    Coordination - The patient had normal movements in the hands with no ataxia or dysmetria.  Tremor was absent.  Gait and Station - deferred    ASSESSMENT/PLAN Mackenzie Richards is a 77 y.o. female with history of hypertension, coronary artery disease, and tobacco abuse presenting with slurred speech, difficulty walking, and right-sided weakness. She did not receive IV t-PA due to late presentation.  Stroke: Acute left pontine infarct, right BG/CR 3 punctate infarcts, embolic pattern, etiology unclear.   Resultant  Mild dysarthria  CT head - No acute intracranial process. Right frontal small old infarct.   MRI head - Acute left pontine infarct. Tiny subacute right lateral lenticulostriate territory infarcts.   CTA Head - diffuse athero b/l MCA, PCA and ACA  Carotid Doppler unremarkable  2D Echo EF 60-65%  LE venous  doppler no DVT  Need TEE and loop for cardioembolic work up given multiple infarcts as well as old infarct at right frontal  LDL - pending  HgbA1c - pending  hypercoagulable labs pending  VTE prophylaxis - Lovenox Fall precautions Diet Heart Room service appropriate? Yes; Fluid consistency: Thin Diet NPO time specified Except for: Sips with Meds  aspirin 81 mg daily and clopidogrel 75 mg daily prior to admission, now on aspirin 325 mg daily and clopidogrel 75 mg daily due to stroke and cardiac prevention.   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   All happened within a year  All happened after leg surgery  On anticoagulation for a year  No more DVT since  Strong family hx of DVT  including mom  Hypercoagulable work up pending  LE venous doppler no DVT   levonox prophylaxis  Mild epistaxis   On DAPT at home  Has very mild epistaxis  Continue monitoring  CAD   On home DAPT  S/p stent  Stable   Trop 0.11->0.09  Hypertension  Stable but tends to run high  Permissive hypertension (OK if < 220/120) but gradually normalize in 5-7 days  On clonidine and enalapril and metoprolol  Long-term BP goal normotensive  Hyperlipidemia  Home meds:  Lipitor 80 mg daily resumed in hospital  LDL pending, goal < 70  Continue statin at discharge  Tobacco abuse  Current smoker  Smoking cessation counseling provided  Pt is willing to quit  Other Stroke Risk Factors  Advanced age  Hx of stroke on imaging - Chronic anterior right frontal infarct   Other Active Problems  Elevated Creatinine 1.21  Sundowning - received haldol overnight   Hospital day # 1  Marvel Plan, MD PhD Stroke Neurology 08/14/2017 3:36 PM   To contact Stroke Continuity provider, please refer to  [email or web address]. After hours, contact General Neurology

## 2017-08-14 NOTE — H&P (View-Only) (Signed)
PROGRESS NOTE  Mackenzie Richards BXI:[id number] DOB: 1940-12-28 DOA: 08/12/2017 PCP: Winifred Olive, MD  HPI/Recap of past 24 hours: Mackenzie Richards is a 77 y.o. female with history of CAD s/p PCI w stenting, hx of MI, hypertension was brought to the ER after patient was found to have increasing persistent slurred speech.  Patient symptoms started last few days ago with increasing generalized weakness and slurred speech. Brought to the ER with concerns for stroke. Admitted for stroke workup.  08/13/17: MRI revealed acute pontine infarct. Dysarthria somewhat improved. Neurology following. PT recommends HH PT. Denies palpitations, headaches, change in vision, numbness or one sided weakness.  08/14/2017: Patient seen and examined at her bedside.  Reported agitation overnight with Haldol given once.  This morning the patient is drowsy however she is easily arousable with the voices.  Around 2 PM RN reports bradycardia.  Twelve-lead EKG ordered.  Patient denies any headaches, change in vision, chest pain or dyspnea.  Assessment/Plan: Principal Problem:   Acute CVA (cerebrovascular accident) [facility]) Active Problems:   Smoker   CAD (coronary artery disease)   History of DVT (deep vein thrombosis)   Essential hypertension   Hyperlipidemia  Acute left pontine CVA -MRI 08/13/17 revealed: 1. Acute left pontine infarct. 2. Tiny subacute right lateral lenticulostriate territory infarcts. -continue asa, statin -continue neurochecks \-fall precaution -last ldl 100 (11/15/16); goal less than 70 -last a1c 6.1 (11/15/16); goal less than 7.0 -continue PT -speech eval  -Possible loop recorder placement in the morning 08/15/2017.  Sinus bradycardia -12-lead EKG ordered- -hold metoprolol until bradycardia has resolved -If persistent will consult cardiology  Acute metabolic encephalopathy, improving -Suspect delirium in the setting of mild dementia -Reorient as needed -If agitated at night may use Seroquel  instead of haldol  CAD S/p PCI with stents -no chest pain -continue home meds  Tobacco use disorder -tobacco counseling at bedside -states she is not ready to quit tobacco use -states tried chantix w allergic reaction "making her cough blood"  Ambulatory dysfunction 2/2 to acute cVA -PT recs HHPT -fall precaution  CKD 3 -baseline cr 1.1 -cr 1.21 -avoid nephrotoxic agents/hypotension, dehydration -Repeat BMP in the morning  Elevated troponin most likely related to acute stroke -peaked at 0.10 -trending down -no chest pain  HLD -ldl 100 in 2018 -ldl ordered and pending -continue lipitor 80 mg daily  Grade 1 diastolic dysfunction EF 60-65% -no PFO -no acute issues -continue cardiac meds  Code Status: full . Family Communication: none at bedside  Disposition Plan: Home after completes stroke work up.  Possible loop recorder placement in the morning.   Consultants:  neurology   Procedures:  none  Antimicrobials:  none  DVT prophylaxis:  SCDs   Objective: Vitals:   08/13/17 2004 08/14/17 0030 08/14/17 0757 08/14/17 1421  BP:  (!) 208/125 (!) 193/78 (!) 142/59  Pulse: 64 67 64 (!) 51  Resp: 18 18 18    Temp: 98.1 F (36.7 C) 98.7 F (37.1 C)    TempSrc: Oral Oral    SpO2: 94% 91% 97% 96%  Weight:      Height:       No intake or output data in the 24 hours ending 08/14/17 1441 Filed Weights   08/13/17 0204  Weight: 65.8 kg (145 lb 1 oz)    Exam: 08/14/2017.  Patient seen and examined.  Physical exam essentially unchanged except for what is mentioned below.   General:  77 yo female WD WN NAD, somnolent but easily arousable  to voice.  Cardiovascular: RRR no rubs or gallops  Respiratory: CTA no wheezes or rales   Abdomen: soft NT ND NBS x4  Musculoskeletal: no focal weakness noted  Skin: no rash  Psychiatry: Unable to assess due to altered mental status.   Data Reviewed: CBC: Recent Labs  Lab 08/12/17 1611  WBC 7.8  NEUTROABS  3.8  HGB 14.4  HCT 44.1  MCV 103.3*  PLT 324   Basic Metabolic Panel: Recent Labs  Lab 08/12/17 1611  NA 141  K 4.1  CL 109  CO2 24  GLUCOSE 98  BUN 22*  CREATININE 1.21*  CALCIUM 8.8*   GFR: Estimated Creatinine Clearance: 35.8 mL/min (A) (by C-G formula based on SCr of 1.21 mg/dL (H)). Liver Function Tests: Recent Labs  Lab 08/12/17 1611  AST 17  ALT 10*  ALKPHOS 78  BILITOT 0.7  PROT 7.0  ALBUMIN 3.7   No results for input(s): LIPASE, AMYLASE in the last 168 hours. No results for input(s): AMMONIA in the last 168 hours. Coagulation Profile: No results for input(s): INR, PROTIME in the last 168 hours. Cardiac Enzymes: No results for input(s): CKTOTAL, CKMB, CKMBINDEX, TROPONINI in the last 168 hours. BNP (last 3 results) No results for input(s): PROBNP in the last 8760 hours. HbA1C: No results for input(s): HGBA1C in the last 72 hours. CBG: No results for input(s): GLUCAP in the last 168 hours. Lipid Profile: No results for input(s): CHOL, HDL, LDLCALC, TRIG, CHOLHDL, LDLDIRECT in the last 72 hours. Thyroid Function Tests: No results for input(s): TSH, T4TOTAL, FREET4, T3FREE, THYROIDAB in the last 72 hours. Anemia Panel: No results for input(s): VITAMINB12, FOLATE, FERRITIN, TIBC, IRON, RETICCTPCT in the last 72 hours. Urine analysis:    Component Value Date/Time   COLORURINE YELLOW 08/12/2017 1822   APPEARANCEUR CLEAR 08/12/2017 1822   LABSPEC 1.012 08/12/2017 1822   PHURINE 5.0 08/12/2017 1822   GLUCOSEU NEGATIVE 08/12/2017 1822   HGBUR SMALL (A) 08/12/2017 1822   BILIRUBINUR NEGATIVE 08/12/2017 1822   KETONESUR NEGATIVE 08/12/2017 1822   PROTEINUR NEGATIVE 08/12/2017 1822   UROBILINOGEN 0.2 03/18/2015 1320   NITRITE NEGATIVE 08/12/2017 1822   LEUKOCYTESUR NEGATIVE 08/12/2017 1822   Sepsis Labs: @LABRCNTIP (procalcitonin:4,lacticidven:4)  )No results found for this or any previous visit (from the past 240 hour(s)).    Studies: No results  found.  Scheduled Meds: .  stroke: mapping our early stages of recovery book   Does not apply Once  . aspirin EC  325 mg Oral Daily  . atorvastatin  80 mg Oral q1800  . cloNIDine  0.1 mg Oral Daily  . clopidogrel  75 mg Oral Q breakfast  . enalapril  20 mg Oral Daily  . enoxaparin (LOVENOX) injection  40 mg Subcutaneous Q24H  . metoprolol tartrate  12.5 mg Oral BID  . potassium chloride SA  20 mEq Oral Daily    Continuous Infusions:    LOS: 1 day     Darlin Drop, MD [facility] Pager [phone]  If 7PM-7AM, please contact night-coverage [email or web address] Password [facility] 08/14/2017, 2:41 PM

## 2017-08-14 NOTE — Progress Notes (Addendum)
PROGRESS NOTE  Mackenzie Richards BXI:[id number] DOB: 1940-12-28 DOA: 08/12/2017 PCP: Winifred Olive, MD  HPI/Recap of past 24 hours: Mackenzie Richards is a 77 y.o. female with history of CAD s/p PCI w stenting, hx of MI, hypertension was brought to the ER after patient was found to have increasing persistent slurred speech.  Patient symptoms started last few days ago with increasing generalized weakness and slurred speech. Brought to the ER with concerns for stroke. Admitted for stroke workup.  08/13/17: MRI revealed acute pontine infarct. Dysarthria somewhat improved. Neurology following. PT recommends HH PT. Denies palpitations, headaches, change in vision, numbness or one sided weakness.  08/14/2017: Patient seen and examined at her bedside.  Reported agitation overnight with Haldol given once.  This morning the patient is drowsy however she is easily arousable with the voices.  Around 2 PM RN reports bradycardia.  Twelve-lead EKG ordered.  Patient denies any headaches, change in vision, chest pain or dyspnea.  Assessment/Plan: Principal Problem:   Acute CVA (cerebrovascular accident) [facility]) Active Problems:   Smoker   CAD (coronary artery disease)   History of DVT (deep vein thrombosis)   Essential hypertension   Hyperlipidemia  Acute left pontine CVA -MRI 08/13/17 revealed: 1. Acute left pontine infarct. 2. Tiny subacute right lateral lenticulostriate territory infarcts. -continue asa, statin -continue neurochecks \-fall precaution -last ldl 100 (11/15/16); goal less than 70 -last a1c 6.1 (11/15/16); goal less than 7.0 -continue PT -speech eval  -Possible loop recorder placement in the morning 08/15/2017.  Sinus bradycardia -12-lead EKG ordered- -hold metoprolol until bradycardia has resolved -If persistent will consult cardiology  Acute metabolic encephalopathy, improving -Suspect delirium in the setting of mild dementia -Reorient as needed -If agitated at night may use Seroquel  instead of haldol  CAD S/p PCI with stents -no chest pain -continue home meds  Tobacco use disorder -tobacco counseling at bedside -states she is not ready to quit tobacco use -states tried chantix w allergic reaction "making her cough blood"  Ambulatory dysfunction 2/2 to acute cVA -PT recs HHPT -fall precaution  CKD 3 -baseline cr 1.1 -cr 1.21 -avoid nephrotoxic agents/hypotension, dehydration -Repeat BMP in the morning  Elevated troponin most likely related to acute stroke -peaked at 0.10 -trending down -no chest pain  HLD -ldl 100 in 2018 -ldl ordered and pending -continue lipitor 80 mg daily  Grade 1 diastolic dysfunction EF 60-65% -no PFO -no acute issues -continue cardiac meds  Code Status: full . Family Communication: none at bedside  Disposition Plan: Home after completes stroke work up.  Possible loop recorder placement in the morning.   Consultants:  neurology   Procedures:  none  Antimicrobials:  none  DVT prophylaxis:  SCDs   Objective: Vitals:   08/13/17 2004 08/14/17 0030 08/14/17 0757 08/14/17 1421  BP:  (!) 208/125 (!) 193/78 (!) 142/59  Pulse: 64 67 64 (!) 51  Resp: 18 18 18    Temp: 98.1 F (36.7 C) 98.7 F (37.1 C)    TempSrc: Oral Oral    SpO2: 94% 91% 97% 96%  Weight:      Height:       No intake or output data in the 24 hours ending 08/14/17 1441 Filed Weights   08/13/17 0204  Weight: 65.8 kg (145 lb 1 oz)    Exam: 08/14/2017.  Patient seen and examined.  Physical exam essentially unchanged except for what is mentioned below.   General:  77 yo female WD WN NAD, somnolent but easily arousable  to voice.  Cardiovascular: RRR no rubs or gallops  Respiratory: CTA no wheezes or rales   Abdomen: soft NT ND NBS x4  Musculoskeletal: no focal weakness noted  Skin: no rash  Psychiatry: Unable to assess due to altered mental status.   Data Reviewed: CBC: Recent Labs  Lab 08/12/17 1611  WBC 7.8  NEUTROABS  3.8  HGB 14.4  HCT 44.1  MCV 103.3*  PLT 324   Basic Metabolic Panel: Recent Labs  Lab 08/12/17 1611  NA 141  K 4.1  CL 109  CO2 24  GLUCOSE 98  BUN 22*  CREATININE 1.21*  CALCIUM 8.8*   GFR: Estimated Creatinine Clearance: 35.8 mL/min (A) (by C-G formula based on SCr of 1.21 mg/dL (H)). Liver Function Tests: Recent Labs  Lab 08/12/17 1611  AST 17  ALT 10*  ALKPHOS 78  BILITOT 0.7  PROT 7.0  ALBUMIN 3.7   No results for input(s): LIPASE, AMYLASE in the last 168 hours. No results for input(s): AMMONIA in the last 168 hours. Coagulation Profile: No results for input(s): INR, PROTIME in the last 168 hours. Cardiac Enzymes: No results for input(s): CKTOTAL, CKMB, CKMBINDEX, TROPONINI in the last 168 hours. BNP (last 3 results) No results for input(s): PROBNP in the last 8760 hours. HbA1C: No results for input(s): HGBA1C in the last 72 hours. CBG: No results for input(s): GLUCAP in the last 168 hours. Lipid Profile: No results for input(s): CHOL, HDL, LDLCALC, TRIG, CHOLHDL, LDLDIRECT in the last 72 hours. Thyroid Function Tests: No results for input(s): TSH, T4TOTAL, FREET4, T3FREE, THYROIDAB in the last 72 hours. Anemia Panel: No results for input(s): VITAMINB12, FOLATE, FERRITIN, TIBC, IRON, RETICCTPCT in the last 72 hours. Urine analysis:    Component Value Date/Time   COLORURINE YELLOW 08/12/2017 1822   APPEARANCEUR CLEAR 08/12/2017 1822   LABSPEC 1.012 08/12/2017 1822   PHURINE 5.0 08/12/2017 1822   GLUCOSEU NEGATIVE 08/12/2017 1822   HGBUR SMALL (A) 08/12/2017 1822   BILIRUBINUR NEGATIVE 08/12/2017 1822   KETONESUR NEGATIVE 08/12/2017 1822   PROTEINUR NEGATIVE 08/12/2017 1822   UROBILINOGEN 0.2 03/18/2015 1320   NITRITE NEGATIVE 08/12/2017 1822   LEUKOCYTESUR NEGATIVE 08/12/2017 1822   Sepsis Labs: @LABRCNTIP (procalcitonin:4,lacticidven:4)  )No results found for this or any previous visit (from the past 240 hour(s)).    Studies: No results  found.  Scheduled Meds: .  stroke: mapping our early stages of recovery book   Does not apply Once  . aspirin EC  325 mg Oral Daily  . atorvastatin  80 mg Oral q1800  . cloNIDine  0.1 mg Oral Daily  . clopidogrel  75 mg Oral Q breakfast  . enalapril  20 mg Oral Daily  . enoxaparin (LOVENOX) injection  40 mg Subcutaneous Q24H  . metoprolol tartrate  12.5 mg Oral BID  . potassium chloride SA  20 mEq Oral Daily    Continuous Infusions:    LOS: 1 day     Darlin Drop, MD [facility] Pager [phone]  If 7PM-7AM, please contact night-coverage [email or web address] Password [facility] 08/14/2017, 2:41 PM

## 2017-08-14 NOTE — Progress Notes (Signed)
Patient has sat up for breakfast; has flat affect; doesn't want to talk; refuses the bed alarm; refuses assistance to the bathroom; got out of bed and walked to the bathroom; gait unsteady.

## 2017-08-14 NOTE — Progress Notes (Signed)
12 lead EKG completed; patient denies shortness of breath presently; continue to monitor vital signs; family at bedside.

## 2017-08-14 NOTE — Progress Notes (Signed)
OT Evaluation:  Clinical Impression: PTA Pt independent in ADL and mobility (former Charity fundraiser) and doing all IADL. Pt is currently overall min guard with RW, and min guard for ADL for safety, cues for sequencing and decreased safety. OT will continue to follow in acute setting prior to dc home with 24 hour supervision from husband and sons.    08/13/17 1700  OT Visit Information  Last OT Received On 08/13/17  Assistance Needed +1  History of Present Illness Mackenzie Richards is a pleasant 77 y/o female admitted on 08/12/17 due to persistent slurred speech and difficulty walking. CT with no acute findings. Patient with a PMH significant for CAD post stenting, HTN DVT, frequent UTI, diverticulitis, and MI.   Precautions  Precautions Fall  Restrictions  Weight Bearing Restrictions No  Home Living  Family/patient expects to be discharged to: Private residence  Living Arrangements Spouse/significant other;Children (2 adult sons)  Available Help at Discharge Family  Type of Home House  Home Access Stairs to enter  Entrance Stairs-Number of Steps 3  Entrance Stairs-Rails None  Home Layout Multi-level;Able to live on main level with bedroom/bathroom  Alternate Level Stairs-Number of Steps 14  Bathroom Shower/Tub Walk-in shower;Tub/shower unit  Tour manager None  Lives With Spouse;Son  Prior Function  Level of Independence Independent  Comments former Public librarian Other (comment);Expressive difficulties (slurred speech)  Pain Assessment  Pain Assessment No/denies pain  Cognition  Arousal/Alertness Awake/alert  Behavior During Therapy WFL for tasks assessed/performed  Overall Cognitive Status Impaired/Different from baseline  Area of Impairment Attention;Following commands;Safety/judgement;Awareness  Current Attention Level Selective  Following Commands Follows one step commands inconsistently  Safety/Judgement Decreased awareness of safety;Decreased  awareness of deficits  Awareness Emergent  General Comments Pt very tangential in communication  Upper Extremity Assessment  Upper Extremity Assessment Generalized weakness  Lower Extremity Assessment  Lower Extremity Assessment Defer to PT evaluation  ADL  Overall ADL's  Needs assistance/impaired  Grooming Min guard;Standing  Upper Body Bathing Minimal assistance  Lower Body Bathing Min guard  Upper Body Dressing  Minimal assistance  Lower Body Dressing Minimal assistance  Lower Body Dressing Details (indicate cue type and reason) min A to don socks sitting EOB  Toilet Transfer Min guard;Ambulation;RW  Toilet Transfer Details (indicate cue type and reason) into bathroom, vc for safety  Toileting- Architect and Hygiene Min guard;Sit to/from stand  Functional mobility during ADLs Min guard;Rolling walker;Cueing for safety  General ADL Comments overall min guard for safety - and min cues for sequencing  Vision- History  Patient Visual Report No change from baseline  Bed Mobility  Overal bed mobility Needs Assistance  Bed Mobility Supine to Sit  Supine to sit Supervision  General bed mobility comments for general safety  Transfers  Overall transfer level Needs assistance  Equipment used Rolling walker (2 wheeled)  Transfers Sit to/from Stand  Sit to Stand Min guard  General transfer comment Min guard for immediate standing balance - minimal LOB upon standing  Balance  Overall balance assessment Needs assistance  Sitting-balance support No upper extremity supported;Feet supported  Sitting balance-Leahy Scale Fair  Standing balance support Bilateral upper extremity supported;During functional activity  Standing balance-Leahy Scale Fair  Standing balance comment benefits from RW for external support  General Comments  General comments (skin integrity, edema, etc.) husband in room throughout  OT - End of Session  Equipment Utilized During Treatment Gait belt;Rolling  walker  Activity Tolerance Patient tolerated treatment well  Patient left in bed;with call bell/phone within reach;with bed alarm set;with family/visitor present  Nurse Communication Mobility status  OT Assessment  OT Recommendation/Assessment Patient needs continued OT Services  OT Visit Diagnosis Unsteadiness on feet (R26.81);Other abnormalities of gait and mobility (R26.89);Other symptoms and signs involving cognitive function  OT Problem List Decreased activity tolerance;Decreased strength;Impaired balance (sitting and/or standing);Decreased cognition;Decreased safety awareness;Decreased knowledge of use of DME or AE  OT Plan  OT Frequency (ACUTE ONLY) Min 2X/week  OT Treatment/Interventions (ACUTE ONLY) Self-care/ADL training;DME and/or AE instruction;Therapeutic activities;Cognitive remediation/compensation;Patient/family education;Balance training  AM-PAC OT "6 Clicks" Daily Activity Outcome Measure  Help from another person eating meals? 4  Help from another person taking care of personal grooming? 3  Help from another person toileting, which includes using toliet, bedpan, or urinal? 3  Help from another person bathing (including washing, rinsing, drying)? 3  Help from another person to put on and taking off regular upper body clothing? 3  Help from another person to put on and taking off regular lower body clothing? 3  6 Click Score 19  ADL G Code Conversion CK  OT Recommendation  Follow Up Recommendations No OT follow up;Supervision/Assistance - 24 hour  OT Equipment Tub/shower seat  Individuals Consulted  Consulted and Agree with Results and Recommendations Patient  Acute Rehab OT Goals  Patient Stated Goal return home  OT Goal Formulation With patient/family  Time For Goal Achievement 08/28/17  Potential to Achieve Goals Good  OT Time Calculation  OT Start Time (ACUTE ONLY) 1456  OT Stop Time (ACUTE ONLY) 1517  OT Time Calculation (min) 21 min  OT General Charges  $OT  Visit 1 Visit  OT Evaluation  $OT Eval Moderate Complexity 1 Mod  Written Expression  Dominant Hand Right    Sherryl Manges OTR/L [phone]

## 2017-08-14 NOTE — Progress Notes (Addendum)
Patient back in room from Vascular lab; heart rate went into the 40's; she states, she feels short of breath. O2 sat 96% on room air; reports being smoker; has a nonproductive cough. MD notified of bradycardia event and patient complaint.  Patient is sleepy; she wont rest;continue to monitor. Husband at bedside; reports patient's  lack of sleep and aware of night time confusion.

## 2017-08-14 NOTE — Progress Notes (Signed)
Patient is not compliant with any safety measures that we have tried to put in place.  Pt will not wear grippy socks for safety, use bed alarm, gait belt, walker.  Pt was ambulating the halls pushing a rolling chair and when staff tried to guide her back to her room patient started flayling arms and almost fell backwards.  Nurse tech and nurse held her under each arm and assisted her back to bed.  Pt screaming, "your murdering me!!!" at the top of her lungs.  Medication was administered but patient still very agitated.  Called on call dr. Because pt refusing to wear telemetry and telemetry calling every 5 mins to notify staff for last several hours.  On call says leave pt off telemetry until morning in hopes sundowning will improve by then.

## 2017-08-14 NOTE — Progress Notes (Signed)
Occupational Therapy Treatment Patient Details Name: Mackenzie Richards MRN: [id number] DOB: Apr 28, 1941 Today's Date: 08/14/2017    History of present illness Ms. Turnbaugh is a pleasant 77 y/o female admitted on 08/12/17 due to persistent slurred speech and difficulty walking. CT with no acute findings. Patient with a PMH significant for CAD post stenting, HTN DVT, frequent UTI, diverticulitis, and MI.    OT comments  Pt progressing towards OT goals this session, she has no recollection of events from last night. Pt pleasant with decreased safety awareness (reluctance to use RW - but agreeable) during toilet transfer, sink level grooming. OT will continue to follow to maximize safety and independence in ADL.   Follow Up Recommendations  No OT follow up;Supervision/Assistance - 24 hour    Equipment Recommendations  Tub/shower seat    Recommendations for Other Services      Precautions / Restrictions Precautions Precautions: Fall Restrictions Weight Bearing Restrictions: No       Mobility Bed Mobility Overal bed mobility: Needs Assistance Bed Mobility: Supine to Sit     Supine to sit: Supervision     General bed mobility comments: for general safety  Transfers Overall transfer level: Needs assistance Equipment used: Rolling walker (2 wheeled) Transfers: Sit to/from Stand Sit to Stand: Min guard         General transfer comment: Min guard for immediate standing balance - minimal LOB upon standing    Balance Overall balance assessment: Needs assistance Sitting-balance support: No upper extremity supported;Feet supported Sitting balance-Leahy Scale: Fair     Standing balance support: Bilateral upper extremity supported;During functional activity Standing balance-Leahy Scale: Fair Standing balance comment: benefits from RW for external support                           ADL either performed or assessed with clinical judgement   ADL Overall ADL's : Needs  assistance/impaired     Grooming: Min guard;Standing;Wash/dry hands;Wash/dry face;Oral care                   Toilet Transfer: Min guard;Ambulation;RW Toilet Transfer Details (indicate cue type and reason): into bathroom, vc for safety. At first Pt reluctant to use RW Toileting- Architect and Hygiene: Min guard;Sit to/from stand       Functional mobility during ADLs: Min guard;Rolling walker;Cueing for safety General ADL Comments: overall min guard for safety - and min cues for sequencing     Vision Patient Visual Report: No change from baseline     Perception     Praxis      Cognition Arousal/Alertness: Awake/alert Behavior During Therapy: WFL for tasks assessed/performed Overall Cognitive Status: Impaired/Different from baseline Area of Impairment: Attention;Following commands;Safety/judgement;Awareness                   Current Attention Level: Selective   Following Commands: Follows one step commands inconsistently Safety/Judgement: Decreased awareness of safety;Decreased awareness of deficits Awareness: Emergent   General Comments: Pt very tangential in communication        Exercises     Shoulder Instructions       General Comments husband in room, O2 used throughout session Saturations >90 throughout    Pertinent Vitals/ Pain          Home Living Family/patient expects to be discharged to:: Private residence Living Arrangements: Spouse/significant other;Children(2 adult sons) Available Help at Discharge: Family Type of Home: House Home Access: Stairs to enter [employer] of Steps:  3 Entrance Stairs-Rails: None Home Layout: Multi-level;Able to live on main level with bedroom/bathroom Alternate Level Stairs-Number of Steps: 14   Bathroom Shower/Tub: Walk-in shower;Tub/shower unit   [employer]: Standard     Home Equipment: None      Lives With: Spouse;Son    Prior Functioning/Environment Level of  Independence: Independent        Comments: former Psychologist, sport and exercise 2X/week        Progress Toward Goals  OT Goals(current goals can now be found in the care plan section)  Progress towards OT goals: Progressing toward goals  Acute Rehab OT Goals Patient Stated Goal: return home OT Goal Formulation: With patient/family Time For Goal Achievement: 08/28/17 Potential to Achieve Goals: Good ADL Goals Pt Will Perform Grooming: with modified independence;standing Pt Will Transfer to Toilet: with modified independence;ambulating Pt Will Perform Toileting - Clothing Manipulation and hygiene: with modified independence;sit to/from stand Additional ADL Goal #1: Will demonstrate safety with RW throughout session with less than or equal to 2 verbal cues  Plan Discharge plan remains appropriate;Frequency remains appropriate    Co-evaluation                 AM-PAC PT "6 Clicks" Daily Activity     Outcome Measure   Help from another person eating meals?: None Help from another person taking care of personal grooming?: A Little Help from another person toileting, which includes using toliet, bedpan, or urinal?: A Little Help from another person bathing (including washing, rinsing, drying)?: A Little Help from another person to put on and taking off regular upper body clothing?: A Little Help from another person to put on and taking off regular lower body clothing?: A Little 6 Click Score: 19    End of Session Equipment Utilized During Treatment: Gait belt;Rolling walker  OT Visit Diagnosis: Unsteadiness on feet (R26.81);Other abnormalities of gait and mobility (R26.89);Other symptoms and signs involving cognitive function   Activity Tolerance Patient tolerated treatment well   Patient Left in bed;with call bell/phone within reach;with bed alarm set;with family/visitor present   Nurse Communication Mobility status        Time: 1610-9604 OT Time Calculation (min): 12  min  Charges: OT General Charges $OT Visit: 1 Visit OT Treatments $Self Care/Home Management : 8-22 mins  Sherryl Manges OTR/L [phone]   Evern Bio Chaos Carlile 08/14/2017, 6:17 PM

## 2017-08-14 NOTE — Progress Notes (Signed)
Bilateral lower extremity venous duplex has been completed. Negative for DVT.  08/14/17 1:21 PM Olen Cordial RVT

## 2017-08-14 NOTE — Evaluation (Signed)
Speech Language Pathology Evaluation Patient Details Name: Mackenzie Richards MRN: [id number] DOB: [birth year] Today's Date: 08/14/2017 Time: 1100-1120 SLP Time Calculation (min) (ACUTE ONLY): 20 min  Problem List:  Patient Active Problem List   Diagnosis Date Noted  . History of DVT (deep vein thrombosis)   . Essential hypertension   . Hyperlipidemia   . Acute CVA (cerebrovascular accident) (HCC) 08/12/2017  . CAD (coronary artery disease) 08/12/2017  . Acute ST elevation myocardial infarction (STEMI) of inferior wall (HCC)   . Aborted myocardial infarction (HCC)   . 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 Asthma 03/18/2015  . Dyslipidemia 03/18/2015  . History of coronary artery stent placement (2009/2010) 03/18/2015  . Smoker 03/18/2015  . Aphasia    Past Medical History:  Past Medical History:  Diagnosis Date  . Arthritis   . Asthma   . Bowen's disease   . Breast cyst   . Complication of anesthesia    i AM SLOW TO WAKE UP   . Coronary artery disease   . Diverticulitis   . DVT (deep venous thrombosis) (HCC)   . Frequent UTI   . Hypertension   . Kidney cysts   . Myocardial infarction [facility]) 11/2016   Past Surgical History:  Past Surgical History:  Procedure Laterality Date  . BILATERAL KNEE ARTHROSCOPY    . CORONARY STENT INTERVENTION N/A 11/15/2016   Procedure: Coronary Stent Intervention;  Surgeon: Lyn Records, MD;  Location: [facility] INVASIVE CV LAB;  Service: Cardiovascular;  Laterality: N/A;  Distal RCA  . EYE SURGERY    . LEFT HEART CATH AND CORONARY ANGIOGRAPHY N/A 11/15/2016   Procedure: Left Heart Cath and Coronary Angiography;  Surgeon: Lyn Records, MD;  Location: [facility] INVASIVE CV LAB;  Service: Cardiovascular;  Laterality: N/A;  . TUBAL LIGATION     HPI:  Mackenzie Richards a 77 y.o.femalewithhistory of CAD status post stenting last one in June 2018 for acute MI, hypertension was brought to the ER after patient was found to have increasing persistent  slurred speech. Patient symptoms started a few days ago with increasing generalized weakness. Yesterday, on August 11, 2017 evening patient's slurred speech became more pronounced and today noticed that patient was finding it difficult to walk. Patient was later brought to the ER with concerns for stroke. Patient denies any difficulty swallowing or any visual symptoms. Has complaint of generalized weakness.  MRI is showing acute left pontine infarct, tiny subacute right lateral lenticulostriate territory infarcts, and chronic right anterior frontal lobe infarcts.     Assessment / Plan / Recommendation Clinical Impression  Cognitive/linguistic and motor speech screen was completed.  Per nursing the patient became very agitated and confused overnight requiring medication.  The patient does not endorse increased confusion as the day progresses at home but her husband does who was present at the bedside.  Oral mechanism exam was completed and unremarkable.  The patient's speech was clear and easy to understand.  She achieved a score of 25/30 on the Mini Mental State Exam suggesting functional cognitive/linguistic skills.   She was oriented to person, place and situation.  She was partially oriented to time.  She knew the year, month and date but not the season or day of week.  She had good immediate recall, but given a short delay was only able to recall 1/3 independently.  Given semantic cue recalled improved to 2/3.  She was able to name objects, repeat a short sentence, follow a 3 step command, read/comprehend  a short sentence and write a short, legible sentence.  She was unable to copy a design.  She was able to provide logical solutions to simple problems and complete a clock drawing tasks fairly well.  Question deficits in insight/judgement.  Patient when queried felt that it was safe to get out of bed without assistance.  When her difficulty walking was brought up she did agree she needed to ask for help,  however, she stated if staff did not come fast enough she would just perform her necessary duties such as using the bathroom alone.  Given these safety issues ST will follow at least briefly.  Suspect other issues to be baseline.      SLP Assessment  SLP Recommendation/Assessment: Patient needs continued Speech Lanaguage Pathology Services SLP Visit Diagnosis: Cognitive communication deficit (R41.841)    Follow Up Recommendations  Other (comment)(continued ST at next level of care)    Frequency and Duration min 1 x/week  2 weeks      SLP Evaluation Cognition  Overall Cognitive Status: History of cognitive impairments - at baseline Arousal/Alertness: Awake/alert Orientation Level: Oriented to person;Oriented to place;Oriented to situation;Disoriented to time Attention: Focused Focused Attention: Appears intact Memory: Impaired Memory Impairment: Decreased recall of new information Awareness: Impaired Awareness Impairment: Intellectual impairment Problem Solving: Appears intact Safety/Judgment: Impaired Comments: Pt with intermittent understanding of safety awareness given her deficits.       Comprehension  Auditory Comprehension Overall Auditory Comprehension: Appears within functional limits for tasks assessed Commands: Within Functional Limits Conversation: Simple Reading Comprehension Reading Status: Within funtional limits    Expression Expression Primary Mode of Expression: Verbal Verbal Expression Overall Verbal Expression: Appears within functional limits for tasks assessed Initiation: No impairment Automatic Speech: Name;Social Response Level of Generative/Spontaneous Verbalization: Sentence;Conversation Repetition: No impairment Naming: No impairment Pragmatics: No impairment Non-Verbal Means of Communication: Not applicable Written Expression Dominant Hand: Right Written Expression: Within Functional Limits   Oral / Motor  Oral Motor/Sensory  Function Overall Oral Motor/Sensory Function: Within functional limits Motor Speech Overall Motor Speech: Appears within functional limits for tasks assessed Respiration: Within functional limits Phonation: Normal Resonance: Within functional limits Articulation: Within functional limitis Intelligibility: Intelligible Motor Planning: Witnin functional limits Motor Speech Errors: Not applicable   GO                  Dimas Aguas, MA, CCC-SLP Acute Rehab SLP [phone]  Fleet Contras 08/14/2017, 12:46 PM

## 2017-08-14 NOTE — Progress Notes (Signed)
Patient gently awakened to repeat her BP; cooperative.

## 2017-08-14 NOTE — Progress Notes (Signed)
Patient cooperative to replace telemetry and take her am medications; still refuses bedalarm and is sitting on the side of the bed.

## 2017-08-15 ENCOUNTER — Encounter (HOSPITAL_COMMUNITY)
Admission: EM | Disposition: A | Discharge status: Home Health | Payer: Self-pay | Source: Home / Self Care | Attending: Internal Medicine

## 2017-08-15 ENCOUNTER — Inpatient Hospital Stay (HOSPITAL_COMMUNITY): Payer: Medicare HMO

## 2017-08-15 ENCOUNTER — Encounter (HOSPITAL_COMMUNITY): Payer: Self-pay | Admitting: *Deleted

## 2017-08-15 DIAGNOSIS — I6389 Other cerebral infarction: Secondary | ICD-10-CM

## 2017-08-15 HISTORY — PX: LOOP RECORDER INSERTION: EP1214

## 2017-08-15 HISTORY — PX: TEE WITHOUT CARDIOVERSION: SHX5443

## 2017-08-15 LAB — BASIC METABOLIC PANEL
Anion gap: 10 (ref 5–15)
BUN: 23 mg/dL — AB (ref 6–20)
CO2: 19 mmol/L — AB (ref 22–32)
Calcium: 8.6 mg/dL — ABNORMAL LOW (ref 8.9–10.3)
Chloride: 110 mmol/L (ref 101–111)
Creatinine, Ser: 1.02 mg/dL — ABNORMAL HIGH (ref 0.44–1.00)
GFR calc Af Amer: >60 mL/min (ref >60)
GFR, EST NON AFRICAN AMERICAN: 52 mL/min — AB (ref >60)
GLUCOSE: 101 mg/dL — AB (ref 65–99)
POTASSIUM: 4 mmol/L (ref 3.5–5.1)
Sodium: 139 mmol/L (ref 135–145)

## 2017-08-15 LAB — HOMOCYSTEINE: Homocysteine: 19.4 umol/L — ABNORMAL HIGH (ref 0.0–15.0)

## 2017-08-15 LAB — HEMOGLOBIN A1C
Hgb A1c MFr Bld: 6.2 % — ABNORMAL HIGH (ref 4.8–5.6)
Mean Plasma Glucose: 131.24 mg/dL

## 2017-08-15 LAB — LIPID PANEL
CHOL/HDL RATIO: 6.4 ratio
CHOLESTEROL: 218 mg/dL — AB (ref 0–200)
HDL: 34 mg/dL — ABNORMAL LOW (ref >40)
LDL Cholesterol: 160 mg/dL — ABNORMAL HIGH (ref 0–99)
Triglycerides: 120 mg/dL (ref <150)
VLDL: 24 mg/dL (ref 0–40)

## 2017-08-15 LAB — LUPUS ANTICOAGULANT PANEL
DRVVT: 39 s (ref 0.0–47.0)
PTT Lupus Anticoagulant: 36.2 s (ref 0.0–51.9)

## 2017-08-15 LAB — BETA-2-GLYCOPROTEIN I ABS, IGG/M/A
Beta-2 Glyco I IgG: <9 GPI IgG units (ref 0–20)
Beta-2-Glycoprotein I IgA: <9 GPI IgA units (ref 0–25)

## 2017-08-15 LAB — TSH: TSH: 1.479 u[IU]/mL (ref 0.350–4.500)

## 2017-08-15 LAB — VITAMIN B12: VITAMIN B 12: 313 pg/mL (ref 180–914)

## 2017-08-15 SURGERY — ECHOCARDIOGRAM, TRANSESOPHAGEAL
Anesthesia: Moderate Sedation

## 2017-08-15 SURGERY — LOOP RECORDER INSERTION

## 2017-08-15 MED ORDER — BUTAMBEN-TETRACAINE-BENZOCAINE 2-2-14 % EX AERO
INHALATION_SPRAY | CUTANEOUS | Status: DC | PRN
  Administered 2017-08-15: 2 via TOPICAL

## 2017-08-15 MED ORDER — CLOPIDOGREL BISULFATE 75 MG PO TABS: 75.0000 mg | ORAL_TABLET | Freq: Every day | ORAL | 0 refills | Status: DC

## 2017-08-15 MED ORDER — ENALAPRIL MALEATE 20 MG PO TABS: 20.0000 mg | ORAL_TABLET | Freq: Every day | ORAL | 0 refills | Status: DC

## 2017-08-15 MED ORDER — ATORVASTATIN CALCIUM 80 MG PO TABS: 80.0000 mg | ORAL_TABLET | Freq: Every day | ORAL | 0 refills | Status: AC

## 2017-08-15 MED ORDER — METOPROLOL TARTRATE 25 MG PO TABS
25.0000 mg | ORAL_TABLET | Freq: Two times a day (BID) | ORAL | Status: DC
  Administered 2017-08-15: 25 mg via ORAL
  Filled 2017-08-15: qty 1

## 2017-08-15 MED ORDER — MIDAZOLAM HCL 5 MG/ML IJ SOLN
INTRAMUSCULAR | Status: AC
  Filled 2017-08-15: qty 2

## 2017-08-15 MED ORDER — FENTANYL CITRATE (PF) 100 MCG/2ML IJ SOLN
INTRAMUSCULAR | Status: AC
  Filled 2017-08-15: qty 2

## 2017-08-15 MED ORDER — CLONIDINE HCL 0.1 MG PO TABS: 0.1000 mg | ORAL_TABLET | Freq: Two times a day (BID) | ORAL | 0 refills | Status: AC

## 2017-08-15 MED ORDER — METOPROLOL TARTRATE 25 MG PO TABS: 25.0000 mg | ORAL_TABLET | Freq: Two times a day (BID) | ORAL | 0 refills | Status: AC

## 2017-08-15 MED ORDER — ASPIRIN 81 MG PO CHEW: 81.0000 mg | CHEWABLE_TABLET | Freq: Every day | ORAL | 0 refills | Status: DC

## 2017-08-15 MED ORDER — LIDOCAINE-EPINEPHRINE 1 %-1:100000 IJ SOLN
INTRAMUSCULAR | Status: AC
  Filled 2017-08-15: qty 1

## 2017-08-15 MED ORDER — LIDOCAINE-EPINEPHRINE 1 %-1:100000 IJ SOLN
INTRAMUSCULAR | Status: DC | PRN
  Administered 2017-08-15: 20 mL

## 2017-08-15 MED ORDER — MIDAZOLAM HCL 10 MG/2ML IJ SOLN
INTRAMUSCULAR | Status: DC | PRN
  Administered 2017-08-15: 1 mg via INTRAVENOUS

## 2017-08-15 MED ORDER — CLONIDINE HCL 0.1 MG PO TABS
0.1000 mg | ORAL_TABLET | Freq: Two times a day (BID) | ORAL | Status: DC
  Administered 2017-08-15: 0.1 mg via ORAL
  Filled 2017-08-15: qty 1

## 2017-08-15 SURGICAL SUPPLY — 2 items
LOOP REVEAL LINQSYS (Prosthesis & Implant Heart) ×3 IMPLANT
PACK LOOP INSERTION (CUSTOM PROCEDURE TRAY) ×3 IMPLANT

## 2017-08-15 NOTE — Progress Notes (Signed)
Call placed to Dr. Jens Som for patient BP, advised patient did not get PO BP meds this AM. Per Dr. Jens Som give POs now. Called to primary RN to send to endo.

## 2017-08-15 NOTE — Progress Notes (Addendum)
STROKE TEAM PROGRESS NOTE   SUBJECTIVE (INTERVAL HISTORY) Her husband is at the bedside.  No reports of sundowning last night. Patient found in bed in NAD. No acute event overnight. Voices no new concerns. Likely to discharge home later today after TEE and loop.  OBJECTIVE Temp:  [97.6 F (36.4 C)-98.4 F (36.9 C)] 97.6 F (36.4 C) (03/11 1233) Pulse Rate:  [51-85] 61 (03/11 1233) Cardiac Rhythm: Normal sinus rhythm (03/11 0700) Resp:  [13-24] 20 (03/11 1233) BP: (142-268)/(56-104) 171/63 (03/11 1233) SpO2:  [92 %-100 %] 94 % (03/11 1233) Weight:  [65.8 kg (145 lb)] 65.8 kg (145 lb) (03/11 0938)  CBC:  Recent Labs  Lab 08/12/17 1611  WBC 7.8  NEUTROABS 3.8  HGB 14.4  HCT 44.1  MCV 103.3*  PLT 324    Basic Metabolic Panel:  Recent Labs  Lab 08/12/17 1611 08/15/17 0709  NA 141 139  K 4.1 4.0  CL 109 110  CO2 24 19*  GLUCOSE 98 101*  BUN 22* 23*  CREATININE 1.21* 1.02*  CALCIUM 8.8* 8.6*    Lipid Panel:     Component Value Date/Time   CHOL 218 (H) 08/15/2017 0709   TRIG 120 08/15/2017 0709   HDL 34 (L) 08/15/2017 0709   CHOLHDL 6.4 08/15/2017 0709   VLDL 24 08/15/2017 0709   LDLCALC 160 (H) 08/15/2017 0709   HgbA1c:  Lab Results  Component Value Date   HGBA1C 6.2 (H) 08/15/2017   Urine Drug Screen:     Component Value Date/Time   LABOPIA NONE DETECTED 08/12/2017 1822   COCAINSCRNUR NONE DETECTED 08/12/2017 1822   LABBENZ NONE DETECTED 08/12/2017 1822   AMPHETMU NONE DETECTED 08/12/2017 1822   THCU NONE DETECTED 08/12/2017 1822   LABBARB NONE DETECTED 08/12/2017 1822    Alcohol Level     Component Value Date/Time   ETH <5 03/18/2015 1253    IMAGING I have personally reviewed the radiological images below and agree with the radiology interpretations.  Ct Angio Head W Or Wo Contrast 08/12/2017 IMPRESSION:  CT HEAD:  1. No acute intracranial process.  2. New nonacute small RIGHT frontal lobe/ACA territory infarct.  3. Progressed moderate to  severe chronic small vessel ischemic disease.  CTA HEAD:  1. No emergent large vessel occlusion.  2. Moderate atherosclerosis with superimposed multifocal severe stenosis bilateral anterior, middle and posterior cerebral arteries.  Mr Brain Wo Contrast 08/13/2017 IMPRESSION:  1. Acute left pontine infarct.  2. Tiny subacute right lateral lenticulostriate territory infarcts.  3. Chronic anterior right frontal infarct, new from 2016.  4. Progressive chronic small vessel ischemia from 2016.   Transthoracic Echocardiogram - Left ventricle: The cavity size was normal. Wall thickness was   increased in a pattern of mild LVH. Systolic function was normal.   The estimated ejection fraction was in the range of 60% to 65%.   Wall motion was normal; there were no regional wall motion   abnormalities. Doppler parameters are consistent with abnormal   left ventricular relaxation (grade 1 diastolic dysfunction). - Aortic valve: Valve area (VTI): 1.9 cm^2. Valve area (Vmax): 2.01   cm^2. Valve area (Vmean): 1.62 cm^2. - Atrial septum: No defect or patent foramen ovale was identified. - Pulmonary arteries: Systolic pressure was mildly increased. PA   peak pressure: 36 mm Hg (S).  Bilateral Carotid Dopplers 1-39% ICA stenosis.  >50% ECA stenosis.  Vertebral artery flow is antegrade.   LE venous doppler - no DVT  TEE Study Conclusions - Left ventricle:  Systolic function was normal. The estimated   ejection fraction was in the range of 55% to 60%. Wall motion was   normal; there were no regional wall motion abnormalities. - Aortic valve: There was a smallmass. There was trivial   regurgitation. - Mitral valve: No evidence of vegetation. There was mild   regurgitation. - Left atrium: The atrium was moderately dilated. No evidence of   thrombus in the atrial cavity or appendage. - Right atrium: No evidence of thrombus in the atrial cavity or   appendage. - Atrial septum: No defect or patent  foramen ovale was identified. - Tricuspid valve: No evidence of vegetation. - Pulmonic valve: No evidence of vegetation. 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   PHYSICAL EXAM Vitals:   08/15/17 1125 08/15/17 1135 08/15/17 1205 08/15/17 1233  BP: (!) 234/71 (!) 216/104 (!) 191/63 (!) 171/63  Pulse: (!) 58 62  61  Resp: 18 (!) 21 20 20   Temp:    97.6 F (36.4 C)  TempSrc:    Oral  SpO2: 98% 96%  94%  Weight:      Height:        Temp:  [97.6 F (36.4 C)-98.4 F (36.9 C)] 97.6 F (36.4 C) (03/11 1233) Pulse Rate:  [51-85] 61 (03/11 1233) Resp:  [13-24] 20 (03/11 1233) BP: (142-268)/(56-104) 171/63 (03/11 1233) SpO2:  [92 %-100 %] 94 % (03/11 1233) Weight:  [65.8 kg (145 lb)] 65.8 kg (145 lb) (03/11 0938)  General - Well nourished, well developed, in no apparent distress.  Ophthalmologic - Fundi not visualized due to .  Cardiovascular - Regular rate and rhythm.  Mental Status -  Level of arousal and orientation to time, place, and person were intact. Language including expression, naming, repetition, comprehension was assessed and found intact. Fund of Knowledge was assessed and was intact.  Cranial Nerves II - XII - II - Visual field intact OU. III, IV, VI - Extraocular movements intact. V - Facial sensation intact bilaterally. VII - Facial movement intact bilaterally. VIII - Hearing & vestibular intact bilaterally. X - Palate elevates symmetrically, mild dysarthria. XI - Chin turning & shoulder shrug intact bilaterally. XII - Tongue protrusion intact.  Motor Strength - The patient's strength was normal in all extremities and pronator drift was absent.  Bulk was normal and fasciculations were absent.   Motor Tone - Muscle tone was assessed at the neck and appendages and was normal.  Reflexes - The patient's reflexes  were 1+ in all extremities and she had no pathological reflexes.  Sensory - Light touch, temperature/pinprick were assessed and were symmetrical.   Coordination - The patient had normal movements in the hands with no ataxia or dysmetria.  Tremor was absent.  Gait and Station - deferred  ASSESSMENT/PLAN Ms. Mackenzie Richards is a 77 y.o. female with history of hypertension, coronary artery disease, and tobacco abuse presenting with slurred speech, difficulty walking, and right-sided weakness. She did not receive IV t-PA due to late presentation.  Stroke: Acute left pontine infarct, right BG/CR 3 punctate infarcts, embolic pattern, etiology unclear.   Resultant  Mild dysarthria  CT head - No acute intracranial process. Right frontal small old infarct.   MRI head - Acute left pontine infarct. Tiny subacute right lateral lenticulostriate territory infarcts.   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 Yes; Fluid consistency: Thin  aspirin 81 mg daily and clopidogrel 75 mg daily prior to admission, now on aspirin 325 mg daily and clopidogrel 75 mg daily due to stroke and cardiac prevention.   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   All happened within a year  All happened after leg surgery  On anticoagulation for a year  No more DVT since  Strong family hx of DVT including mom  Hypercoagulable work up - negative thus far  LE  venous doppler no DVT   lovonox prophylaxis  Mild epistaxis   On DAPT at home  Has very mild epistaxis, none noted on exam todya  Continue monitoring  CAD   On home DAPT  S/p stent  Stable   Trop 0.11->0.09  Hypertension  Stable but tends to run high  Permissive hypertension (OK if < 220/120) but gradually normalize in 5-7 days  On clonidine and enalapril and metoprolol  Long-term BP goal normotensive  Hyperlipidemia  Home meds:  Lipitor 80 mg daily resumed in hospital  LDL 160, goal < 70  Continue statin at discharge  Tobacco abuse  Current smoker  Smoking cessation counseling provided  Pt is willing to quit  Other Stroke Risk Factors  Advanced age  Hx of stroke on imaging - Chronic anterior right frontal infarct   Other Active Problems  Elevated Creatinine 1.21>1.02  Sundowning - no reports of AMS overnight   Hospital day # 2  Neurology will sign off. Please call with questions. Pt will follow up with stroke clinic NP at [facility] in about 4 weeks. Thanks for the consult.  Marvel Plan, MD PhD Stroke Neurology 08/15/2017 4:35 PM    To contact Stroke Continuity provider, please refer to [email or web address]. After hours, contact General Neurology

## 2017-08-15 NOTE — Discharge Summary (Signed)
Discharge Summary  KALIANA GULLIKSON MGN:[id number] DOB: 10/11/1940  PCP: Winifred Olive, MD  Admit date: 08/12/2017 Discharge date: 08/15/2017  Time spent: 25 minutes   Recommendations for Outpatient Follow-up:  Follow-up with neurology Follow-up with PCP Take your medications as prescribed   Discharge Diagnoses:  Active Hospital Problems   Diagnosis Date Noted  . Acute CVA (cerebrovascular accident) (HCC) 08/12/2017  . History of DVT (deep vein thrombosis)   . Essential hypertension   . Hyperlipidemia   . CAD (coronary artery disease) 08/12/2017  . Smoker 03/18/2015    Resolved Hospital Problems  No resolved problems to display.    Discharge Condition: Stable  Diet recommendation: Resume previous diet  Vitals:   08/15/17 1205 08/15/17 1233  BP: (!) 191/63 (!) 171/63  Pulse:  61  Resp: 20 20  Temp:  97.6 F (36.4 C)  SpO2:  94%    History of present illness:  Mackenzie Richards a 77 y.o.femalewithhistory of CAD s/p PCI w stenting, hx of MI, hypertension was brought to the ER after patient was found to have increasing persistent slurred speech. Patient symptoms started last few days ago with increasing generalized weakness and slurred speech. Brought to the ER with concerns for stroke. Admitted for stroke workup.  08/13/17: MRI revealed acute pontine infarct. Dysarthria somewhat improved. Neurology following. PT recommends HH PT. Denies palpitations, headaches, change in vision, numbness or one sided weakness.  08/14/2017: Patient seen and examined at her bedside.  Reported agitation overnight with Haldol given once.  This morning the patient is drowsy however she is easily arousable with the voices.  Around 2 PM RN reports bradycardia.  Twelve-lead EKG ordered.  Patient denies any headaches, change in vision, chest pain or dyspnea.  08/15/2017: Patient seen and examined at her bedside with husband and son present.  She has no new complaints she denies any headache  change in vision chest pain palpitations or dyspnea.  Plan for TEE this morning and the loop recorder placement.  On the day of discharge the patient was hemodynamically stable.  She will need to follow-up with neurology and PCP within 2 weeks.   Hospital Course:  Principal Problem:   Acute CVA (cerebrovascular accident) [facility]) Active Problems:   Smoker   CAD (coronary artery disease)   History of DVT (deep vein thrombosis)   Essential hypertension   Hyperlipidemia  Acute left pontine CVA -MRI 08/13/17 revealed: 1. Acute left pontine infarct. 2. Tiny subacute right lateral lenticulostriate territory infarcts. -continue asa, statin -continue neurochecks \-fall precaution -last ldl 100 (11/15/16); goal less than 70 -last a1c 6.1 (11/15/16); goal less than 7.0 -continue PT -speech eval  -Loop recorder placement 08/15/2017. -TEE 08/15/2017  Sinus bradycardia, resolved -asymptomatic  Uncontrolled hypertension -On home clonidine 0.1 daily, enalapril 20 mg daily, Lopressor 25 mg twice daily -Resume all antihypertensive home meds -Increase frequency of clonidine from 0.1 daily to 0.1 twice daily -Follow-up with PCP  Acute metabolic encephalopathy, resolved -Suspect delirium in the setting of mild dementia -Reorient as needed  CAD S/p PCI with stents -no chest pain -continue home meds -On aspirin, Plavix, Lipitor, Lopressor  Tobacco use disorder -tobacco counseling at bedside -states she is not ready to quit tobacco use -states tried chantix w allergic reaction "making her cough blood"  Ambulatory dysfunction 2/2 to acute cVA -PT recs HHPT -fall precaution  CKD 3, back to baseline creatinine -cr  1.02 from 1.21 -avoid nephrotoxic agents/hypotension, dehydration  Elevated troponin most likely related to acute stroke -peaked  at 0.10 -Trended down -no chest pain  HLD -ldl 100 in 2018 -Repeat LDL 160 on 08/15/2017 -continue lipitor 80 mg daily  Grade 1  diastolic dysfunction EF 60-65% -no PFO -no acute issues -continue cardiac meds    Procedures:  TEE 08/15/2017  Loop recorder placement 08/15/2017  Consultations:  Cardiology   neurology  Discharge Exam: BP (!) 171/63 (BP Location: Left Arm)   Pulse 61   Temp 97.6 F (36.4 C) (Oral)   Resp 20   Ht 5\' 2"  (1.575 m)   Wt 65.8 kg (145 lb)   SpO2 94%   BMI 26.52 kg/m   General: 77 year old Caucasian female. Well-developed well-nourished in no acute distress alert and oriented x3  Cardiovascular: Regular rate and rhythm with no rubs or gallops Respiratory: Clear to auscultation with no wheezes or rales  Discharge Instructions You were cared for by a hospitalist during your hospital stay. If you have any questions about your discharge medications or the care you received while you were in the hospital after you are discharged, you can call the unit and asked to speak with the hospitalist on call if the hospitalist that took care of you is not available. Once you are discharged, your primary care physician will handle any further medical issues. Please note that NO REFILLS for any discharge medications will be authorized once you are discharged, as it is imperative that you return to your primary care physician (or establish a relationship with a primary care physician if you do not have one) for your aftercare needs so that they can reassess your need for medications and monitor your lab values.   Allergies as of 08/15/2017      Reactions   Biaxin [clarithromycin] Other (See Comments)   Unknown   Enalapril Itching, Other (See Comments)   Pt states she gets hyper   Fentanyl Other (See Comments)   Unknown   Hydrochlorothiazide Other (See Comments)   Unknown   Lasix [furosemide] Other (See Comments)   Unknown   Levaquin [levofloxacin In D5w] Other (See Comments)   Unknown   Penicillins Other (See Comments)   Has patient had a PCN reaction causing immediate rash,  facial/tongue/throat swelling, SOB or lightheadedness with hypotension: Yes Has patient had a PCN reaction causing severe rash involving mucus membranes or skin necrosis: No Has patient had a PCN reaction that required hospitalization: No Has patient had a PCN reaction occurring within the last 10 years: No If all of the above answers are "NO", then may proceed with Cephalosporin use.   Percocet [oxycodone-acetaminophen] Other (See Comments)   Unknown   Sulfa Antibiotics Other (See Comments)   Unknown   Zyban [bupropion] Other (See Comments)   Unknown      Medication List    STOP taking these medications   Acetaminophen 500 MG coapsule   ciprofloxacin 500 MG tablet Commonly known as:  CIPRO   metroNIDAZOLE 500 MG tablet Commonly known as:  FLAGYL   zolpidem 10 MG tablet Commonly known as:  AMBIEN     TAKE these medications   aspirin 81 MG chewable tablet Chew 1 tablet (81 mg total) by mouth daily.   atorvastatin 80 MG tablet Commonly known as:  LIPITOR Take 1 tablet (80 mg total) by mouth daily at 6 PM.   cloNIDine 0.1 MG tablet Commonly known as:  CATAPRES Take 1 tablet (0.1 mg total) by mouth 2 (two) times daily. What changed:    when to take this  additional instructions  clopidogrel 75 MG tablet Commonly known as:  PLAVIX Take 1 tablet (75 mg total) by mouth daily with breakfast. Start taking on:  08/16/2017   enalapril 20 MG tablet Commonly known as:  VASOTEC Take 1 tablet (20 mg total) by mouth daily. Start taking on:  08/16/2017   metoprolol tartrate 25 MG tablet Commonly known as:  LOPRESSOR Take 1 tablet (25 mg total) by mouth 2 (two) times daily. What changed:  how much to take   nitroGLYCERIN 0.4 MG SL tablet Commonly known as:  NITROSTAT Place 1 tablet (0.4 mg total) under the tongue every 5 (five) minutes as needed.   potassium chloride SA 20 MEQ tablet Commonly known as:  K-DUR,KLOR-CON Take 1 tablet (20 mEq total) by mouth daily.       Allergies  Allergen Reactions  . Biaxin [Clarithromycin] Other (See Comments)    Unknown  . Enalapril Itching and Other (See Comments)    Pt states she gets hyper  . Fentanyl Other (See Comments)    Unknown  . Hydrochlorothiazide Other (See Comments)    Unknown  . Lasix [Furosemide] Other (See Comments)    Unknown  . Levaquin [Levofloxacin In D5w] Other (See Comments)    Unknown  . Penicillins Other (See Comments)    Has patient had a PCN reaction causing immediate rash, facial/tongue/throat swelling, SOB or lightheadedness with hypotension: Yes Has patient had a PCN reaction causing severe rash involving mucus membranes or skin necrosis: No Has patient had a PCN reaction that required hospitalization: No Has patient had a PCN reaction occurring within the last 10 years: No If all of the above answers are "NO", then may proceed with Cephalosporin use.   Marland Kitchen Percocet [Oxycodone-Acetaminophen] Other (See Comments)    Unknown  . Sulfa Antibiotics Other (See Comments)    Unknown  . Zyban [Bupropion] Other (See Comments)    Unknown   Follow-up Information    Winifred Olive, MD Follow up in 1 week(s).   Specialty:  Internal Medicine Contact information: [facility] [place] [address] [phone]        Duke Salvia, MD Follow up in 1 week(s).   Specialty:  Cardiology Contact information: [address] [place] [address] [phone]        Marvel Plan, MD Follow up in 2 week(s).   Specialty:  Neurology Contact information: [address] [place] [address] [phone]        Lewayne Bunting, MD Follow up in 2 week(s).   Specialty:  Cardiology Contact information: [address] [place] [address] [phone]            The results of significant diagnostics from this hospitalization (including imaging, microbiology, ancillary and laboratory) are listed below for reference.     Significant Diagnostic Studies: Ct Angio Head W Or Wo Contrast  Result Date: 08/12/2017 CLINICAL DATA:  Flu-like symptoms for 2 weeks with weakness. Headache. History of hypertension. EXAM: CT ANGIOGRAPHY HEAD TECHNIQUE: Multidetector CT imaging of the head was performed using the standard protocol during bolus administration of intravenous contrast. Multiplanar CT image reconstructions and MIPs were obtained to evaluate the vascular anatomy. CONTRAST:  ISOVUE-370 IOPAMIDOL (ISOVUE-370) INJECTION 76% COMPARISON:  MRI/MRA head March 18, 2015. CT HEAD March 18, 2015 FINDINGS: CT HEAD BRAIN: No intraparenchymal hemorrhage, mass effect nor midline shift. The ventricles and sulci are normal for age. Confluent supratentorial white matter hypodensities. Faint hypodensities bilateral basal ganglia and  thalami associated with chronic small vessel ischemic disease. New nonacute antrum mesial RIGHT frontal lobe encephalomalacia. No acute large vascular territory infarcts. No abnormal extra-axial fluid collections. Basal cisterns are patent. VASCULAR: Mild calcific atherosclerosis of the carotid siphons. SKULL: No skull fracture. Mild RIGHT temporomandibular osteoarthrosis. No significant scalp soft tissue swelling. SINUSES/ORBITS: Trace paranasal sinus mucosal thickening with small mucosal retention cyst. Trace bilateral mastoid effusions. Included ocular globes and orbital contents are non-suspicious. Status post bilateral ocular lens implants. OTHER: None. CTA HEAD ANTERIOR CIRCULATION: Patent cervical internal carotid arteries, petrous, cavernous and supra clinoid internal carotid arteries. Patent anterior communicating artery. Patent anterior and middle cerebral arteries. Severe tandem stenosis LEFT A2 segment and severe stenosis RIGHT A3 segment. Moderate and severe tandem stenosis bilateral middle cerebral arteries. No large vessel occlusion,  contrast extravasation or aneurysm. POSTERIOR CIRCULATION:  Patent vertebral arteries, vertebrobasilar junction and basilar artery, as well as main branch vessels. Patent posterior cerebral arteries. Scattered severe stenosis mid to distal posterior cerebral arteries. No large vessel occlusion, contrast extravasation or aneurysm. VENOUS SINUSES: Major dural venous sinuses are patent though not tailored for evaluation on this angiographic examination. ANATOMIC VARIANTS: None. DELAYED PHASE: No abnormal intracranial enhancement. MIP images reviewed. IMPRESSION: CT HEAD: 1. No acute intracranial process. 2. New nonacute small RIGHT frontal lobe/ACA territory infarct. 3. Progressed moderate to severe chronic small vessel ischemic disease. CTA HEAD: 1. No emergent large vessel occlusion. 2. Moderate atherosclerosis with superimposed multifocal severe stenosis bilateral anterior, middle and posterior cerebral arteries. Electronically Signed   By: Awilda Metro M.D.   On: 08/12/2017 19:25   Dg Chest 2 View  Result Date: 08/12/2017 CLINICAL DATA:  Weakness and flu like symptoms. EXAM: CHEST - 2 VIEW COMPARISON:  11/18/2013 and prior chest radiographs FINDINGS: This is a mildly low volume film. The cardiomediastinal silhouette is unremarkable. Mild peribronchial thickening is unchanged. There is no evidence of focal airspace disease, pulmonary edema, suspicious pulmonary nodule/mass, pleural effusion, or pneumothorax. No acute bony abnormalities are identified. IMPRESSION: No active cardiopulmonary disease. Electronically Signed   By: Harmon Pier M.D.   On: 08/12/2017 17:36   Mr Brain Wo Contrast  Result Date: 08/13/2017 CLINICAL DATA:  Weakness and slurred speech. EXAM: MRI HEAD WITHOUT CONTRAST TECHNIQUE: Multiplanar, multiecho pulse sequences of the brain and surrounding structures were obtained without intravenous contrast. COMPARISON:  Head CT 08/12/2017 and MRI 03/18/2015 FINDINGS: Brain: There is a small acute left paramedian pontine infarct. There are also punctate  foci of trace diffusion signal hyperintensity in the right lateral lenticulostriate territory extending from the superior aspect of the right lentiform nucleus to the caudate body with normal ADC suggesting subacute infarcts. No intracranial hemorrhage, mass, midline shift, or extra-axial fluid collection is identified. A small chronic anteromedial right frontal lobe infarct is new from 2016. Patchy to confluent T2 hyperintensities elsewhere in the cerebral white matter bilaterally and in the pons have slightly progressed from 2016 and are nonspecific but compatible with moderate chronic small vessel ischemic disease. There are multiple chronic lacunar infarcts in the basal ganglia and thalami which are new from 2016. Generalized cerebral atrophy is stable to slightly progressed from 2016. Vascular: Major intracranial vascular flow voids are preserved. Skull and upper cervical spine: Unremarkable bone marrow signal. Sinuses/Orbits: Bilateral cataract extraction. Trace right mastoid effusion. Clear paranasal sinuses. Other: None. IMPRESSION: 1. Acute left pontine infarct. 2. Tiny subacute right lateral lenticulostriate territory infarcts. 3. Chronic anterior right frontal infarct, new from 2016. 4. Progressive chronic small vessel ischemia from  2016. Electronically Signed   By: Sebastian Ache M.D.   On: 08/13/2017 07:17    Microbiology: No results found for this or any previous visit (from the past 240 hour(s)).   Labs: Basic Metabolic Panel: Recent Labs  Lab 08/12/17 1611 08/15/17 0709  NA 141 139  K 4.1 4.0  CL 109 110  CO2 24 19*  GLUCOSE 98 101*  BUN 22* 23*  CREATININE 1.21* 1.02*  CALCIUM 8.8* 8.6*   Liver Function Tests: Recent Labs  Lab 08/12/17 1611  AST 17  ALT 10*  ALKPHOS 78  BILITOT 0.7  PROT 7.0  ALBUMIN 3.7   No results for input(s): LIPASE, AMYLASE in the last 168 hours. No results for input(s): AMMONIA in the last 168 hours. CBC: Recent Labs  Lab 08/12/17 1611  WBC  7.8  NEUTROABS 3.8  HGB 14.4  HCT 44.1  MCV 103.3*  PLT 324   Cardiac Enzymes: No results for input(s): CKTOTAL, CKMB, CKMBINDEX, TROPONINI in the last 168 hours. BNP: BNP (last 3 results) No results for input(s): BNP in the last 8760 hours.  ProBNP (last 3 results) No results for input(s): PROBNP in the last 8760 hours.  CBG: No results for input(s): GLUCAP in the last 168 hours.     Signed:  Darlin Drop, MD [facility] 08/15/2017, 1:20 PM

## 2017-08-15 NOTE — CV Procedure (Signed)
    Transesophageal Echocardiogram Note  Mackenzie Richards [id number] 1940-07-06  Procedure: Transesophageal Echocardiogram Indications: CVA  Procedure Details Consent: Obtained Time Out: Verified patient identification, verified procedure, site/side was marked, verified correct patient position, special equipment/implants available, Radiology Safety Procedures followed,  medications/allergies/relevent history reviewed, required imaging and test results available.  Performed  Medications:  During this procedure the patient is administered a total of Versed 1 mg to achieve and maintain moderate conscious sedation.  The patient's heart rate, blood pressure, and oxygen saturation are monitored continuously during the procedure. The period of conscious sedation is 30 minutes, of which I was present face-to-face 100% of this time.  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   Complications: No apparent complications Patient did tolerate procedure well.  Olga Millers, MD

## 2017-08-15 NOTE — Progress Notes (Signed)
  Echocardiogram 2D Echocardiogram has been performed.  Celene Skeen 08/15/2017, 11:25 AM

## 2017-08-15 NOTE — Progress Notes (Signed)
PT Cancellation Note  Patient Details Name: Mackenzie Richards MRN: [id number] DOB: 1941-02-04   Cancelled Treatment:    Reason Eval/Treat Not Completed: Patient at procedure or test/unavailable Pt off floor at endo. Will follow up as time allows.   Blake Divine A Ecko Beasley 08/15/2017, 11:29 AM Mylo Red, PT, DPT [phone]

## 2017-08-15 NOTE — Progress Notes (Signed)
Patient and family given discharge instructions and follow up information. Able to verbalize understanding and teach back. IV and tele removed. Discharge summary signed and placed in chart. Patient discharged to entrance per wheelchair by staff, Family transported home.No new questions or concerns.

## 2017-08-15 NOTE — Discharge Instructions (Addendum)
Implant site wound care instructions Keep incision clean and dry for 3 days. You can remove outer dressing tomorrow. Leave steri-strips (little pieces of tape) on until seen in the office for wound check appointment Call the office [phone]) for redness, drainage, swelling, or fever.   Weakness Weakness is a lack of strength. You may feel weak all over your body (generalized), or you may feel weak in one specific part of your body (focal). There are many potential causes of weakness. Sometimes, the cause of your weakness may not be known. Some causes of weakness can be serious, so it is important to see your doctor. Follow these instructions at home:  Rest as needed.  Try to get enough sleep. Talk to your doctor about how much sleep you need each night.  Take over-the-counter and prescription medicines only as told by your doctor.  Eat a healthy, well-balanced diet. This includes: ? Proteins to build muscles, such as lean meats and fish. ? Fresh fruits and vegetables. ? Carbohydrates to boost energy, such as whole grains.  Drink enough fluid to keep your pee (urine) clear or pale yellow.  Do strength exercises, such as arm curls and leg raises, for 30 minutes at least 2 days a week or as told by your doctor.  Think about working with a physical therapist or trainer to help you get stronger.  Keep all follow-up visits as told by your doctor. This is important. Contact a doctor if:  Your weakness does not get better or it gets worse.  Your weakness affects your ability to: ? Think clearly. ? Do your normal daily activities. Get help right away if:  You have sudden weakness.  You have trouble breathing or shortness of breath.  You have problems with your vision.  You have trouble talking or swallowing.  You have trouble standing or walking.  You have chest pain.  You are light-headed.  You pass out (lose consciousness). This information is not intended to replace  advice given to you by your health care provider. Make sure you discuss any questions you have with your health care provider. Document Released: 05/06/2008 Document Revised: 06/19/2015 Document Reviewed: 03/14/2015 Elsevier Interactive Patient Education  [employer].

## 2017-08-15 NOTE — Progress Notes (Signed)
Completed patient NIH score change greater than 3 notified neuro.  No new orders.

## 2017-08-15 NOTE — Progress Notes (Signed)
Physical Therapy Treatment Patient Details Name: Mackenzie Richards MRN: [id number] DOB: Oct 25, 1940 Today's Date: 08/15/2017    History of Present Illness Mackenzie Richards is a pleasant 77 y/o female admitted on 08/12/17 due to persistent slurred speech and difficulty walking. CT with no acute findings. MRI brain-Acute left pontine infarct. Patient with a PMH significant for CAD post stenting, HTN DVT, frequent UTI, diverticulitis, and MI.     PT Comments    Patient progressing slowly towards PT goals. Pt reluctant to have any assist/help and declines any alarms being set at this time. Pt high fall risk and very unsteady on feet. Demonstrates poor safety awareness and awareness of deficits. Will need supervision for mobility at home. Might benefit from RW but adamant about not using it for mobility. Will continue to follow and work on balance and safety.    Follow Up Recommendations  Home health PT;Supervision for mobility/OOB     Equipment Recommendations  Rolling walker with 5" wheels    Recommendations for Other Services       Precautions / Restrictions Precautions Precautions: Fall Precaution Comments: poor safety awareness Restrictions Weight Bearing Restrictions: No    Mobility  Bed Mobility Overal bed mobility: Needs Assistance Bed Mobility: Supine to Sit     Supine to sit: Supervision     General bed mobility comments: Supervision for safety.   Transfers Overall transfer level: Needs assistance Equipment used: None Transfers: Sit to/from Stand Sit to Stand: Min guard         General transfer comment: Min guard for safety. LOB onto bed when donning house coat. Transferred to chair post ambulation.  Ambulation/Gait Ambulation/Gait assistance: Min assist Ambulation Distance (Feet): 175 Feet Assistive device: None Gait Pattern/deviations: Step-through pattern;Decreased stride length;Drifts right/left Gait velocity: decreased   General Gait Details: Slow, unsteady  gait with drifing to right/left, No awareness of imbalance. 2/4 DOE. VSS.   Stairs Stairs: Yes   Stair Management: Step to pattern;Alternating pattern;Two rails Number of Stairs: 3(+ 2 steps x2 bouts) General stair comments: Pt trips when trying to ascend first step; very unsteady. BUE support  Wheelchair Mobility    Modified Rankin (Stroke Patients Only) Modified Rankin (Stroke Patients Only) Pre-Morbid Rankin Score: Moderately severe disability Modified Rankin: Moderately severe disability     Balance Overall balance assessment: Needs assistance Sitting-balance support: Feet supported;No upper extremity supported Sitting balance-Leahy Scale: Good     Standing balance support: During functional activity Standing balance-Leahy Scale: Poor Standing balance comment: Able to donn house coat in standing with LOB onto bed.                             Cognition Arousal/Alertness: Awake/alert Behavior During Therapy: WFL for tasks assessed/performed Overall Cognitive Status: Impaired/Different from baseline Area of Impairment: Attention;Following commands;Safety/judgement;Awareness                   Current Attention Level: Selective   Following Commands: Follows one step commands inconsistently;Follows one step commands with increased time Safety/Judgement: Decreased awareness of safety;Decreased awareness of deficits Awareness: Emergent   General Comments: Poor safety awareness/awareness of deficits. "I am fine, feel completely normal."      Exercises      General Comments General comments (skin integrity, edema, etc.): Family left room for session.       Pertinent Vitals/Pain Pain Assessment: No/denies pain    Home Living  Prior Function            PT Goals (current goals can now be found in the care plan section) Progress towards PT goals: Progressing toward goals    Frequency    Min 4X/week      PT  Plan Current plan remains appropriate    Co-evaluation              AM-PAC PT "6 Clicks" Daily Activity  Outcome Measure  Difficulty turning over in bed (including adjusting bedclothes, sheets and blankets)?: None Difficulty moving from lying on back to sitting on the side of the bed? : None Difficulty sitting down on and standing up from a chair with arms (e.g., wheelchair, bedside commode, etc,.)?: None Help needed moving to and from a bed to chair (including a wheelchair)?: A Little Help needed walking in hospital room?: A Little Help needed climbing 3-5 steps with a railing? : A Little 6 Click Score: 21    End of Session Equipment Utilized During Treatment: Gait belt Activity Tolerance: Patient tolerated treatment well Patient left: in chair;with call bell/phone within reach(refused to allow PT to place chair alarm, RN notified. ) Nurse Communication: Mobility status PT Visit Diagnosis: Unsteadiness on feet (R26.81);Other abnormalities of gait and mobility (R26.89);Muscle weakness (generalized) (M62.81);Difficulty in walking, not elsewhere classified (R26.2)     Time: 1610-9604 PT Time Calculation (min) (ACUTE ONLY): 20 min  Charges:  $Gait Training: 8-22 mins                    G Codes:       Mackenzie Richards, PT, DPT [phone]     Mackenzie Richards 08/15/2017, 1:49 PM

## 2017-08-15 NOTE — Care Management Note (Addendum)
Case Management Note  Patient Details  Name: IZAMAR LINDEN MRN: [id number] Date of Birth: [birth year]  Subjective/Objective:    Pt admitted with CVA. She is from home with her spouse and son.                 Action/Plan: Pt discharging home with [facility] services. CM met with the patient and provided choice. They selected [facility]. CM called [facility] and awaiting acceptance of the referral.  Pt with orders for rolling walker. CM notified Jermaine with [facility] and he will deliver to the room. Pt has transportation home.   Addendum: 8453:: [facility] accepted the referral.   Expected Discharge Date:  08/15/17               Expected Discharge Plan:  [place]  In-House Referral:  NA  Discharge planning Services  CM Consult  Post Acute Care Choice:  Durable Medical Equipment, Home Health Choice offered to:  Patient, Spouse  DME Arranged:  Walker rolling DME Agency:  [facility] Arranged:  RN, PT, Speech Therapy HH Agency:  [facility]  Status of Service:  In process, will continue to follow  If discussed at Long Length of Stay Meetings, dates discussed:    Additional Comments:  Pollie Friar, RN 08/15/2017, 1:49 PM

## 2017-08-15 NOTE — Interval H&P Note (Signed)
History and Physical Interval Note:  08/15/2017 10:15 AM  Oneita Jolly  has presented today for surgery, with the diagnosis of stroke  The various methods of treatment have been discussed with the patient and family. After consideration of risks, benefits and other options for treatment, the patient has consented to  Procedure(s): TRANSESOPHAGEAL ECHOCARDIOGRAM (TEE) (N/A) as a surgical intervention .  The patient's history has been reviewed, patient examined, no change in status, stable for surgery.  I have reviewed the patient's chart and labs.  Questions were answered to the patient's satisfaction.     Olga Millers

## 2017-08-15 NOTE — Progress Notes (Signed)
SLP Cancellation Note  Patient Details Name: Mackenzie Richards MRN: [id number] DOB: August 14, 1940   Cancelled treatment:       Reason Eval/Treat Not Completed: Patient at procedure or test/unavailable. Will continue efforts.   Celia B. Murvin [facility], CCC-SLP Speech Language Pathologist [phone]  Leigh Aurora 08/15/2017, 10:15 AM

## 2017-08-15 NOTE — Progress Notes (Signed)
Pt returned from procedure

## 2017-08-15 NOTE — Consult Note (Addendum)
ELECTROPHYSIOLOGY CONSULT NOTE  Patient ID: Mackenzie Richards MRN: [id number], DOB/AGE: [birth year]   Admit date: 08/12/2017 Date of Consult: 08/15/2017  Primary Physician: Winifred Olive, MD Primary Cardiologist: she states she would like to follow with Dr. Katrinka Blazing (not seen since her MI/PCI in June 2018), she no-showed to [facility] cardiology visit Aug 2018 Reason for Consultation: Cryptogenic stroke ; recommendations regarding Implantable Loop Recorder, requested by dr. Roda Shutters  History of Present Illness Mackenzie Richards was admitted on 08/12/2017 with R sided weakness, slurred speech, dx w/CVA.   PMHx is noted for CAD w/last PCI June 2018, HTN, HLD, smoker, she is a retired Chief Executive Officer.  They first developed symptoms while at home.  Imaging demonstrated Acute left pontine infarct, right BG/CR 3 punctate infarcts, embolic pattern, etiology unclear.  she has undergone workup for stroke including echocardiogram and carotid dopplers.  The patient has been monitored on telemetry which has demonstrated sinus rhythm with no arrhythmias.  Inpatient stroke work-up is to be completed with a TEE.   Transthoracic Echocardiogram - Left ventricle: The cavity size was normal. Wall thickness was increased in a pattern of mild LVH. Systolic function was normal. The estimated ejection fraction was in the range of 60% to 65%. Wall motion was normal; there were no regional wall motion abnormalities. Doppler parameters are consistent with abnormal left ventricular relaxation (grade 1 diastolic dysfunction). - Aortic valve: Valve area (VTI): 1.9 cm^2. Valve area (Vmax): 2.01 cm^2. Valve area (Vmean): 1.62 cm^2. - Atrial septum: No defect or patent foramen ovale was identified. - Pulmonary arteries: Systolic pressure was mildly increased. PA peak pressure: 36 mm Hg (S).  Bilateral Carotid Dopplers 1-39% ICA stenosis. >50% ECA stenosis. Vertebral artery flow is antegrade.   LE venous doppler - no DVT    Lab  work is reviewed.  Prior to admission, the patient denies chest pain, shortness of breath, dizziness, or syncope.   She does have infrequent palpitations that she says has been noted to be PVCs,  She tells me she has worn holter monitors very remotely, noted for PVCs, never has been found to have AFib. (Care everywhere EKGs/monitor all report SR  She is recovering from their stroke with plans to home at discharge, today per the patient/husband    Past Medical History:  Diagnosis Date  . Arthritis   . Asthma   . Bowen's disease   . Breast cyst   . Complication of anesthesia    i AM SLOW TO WAKE UP   . Coronary artery disease   . Diverticulitis   . DVT (deep venous thrombosis) (HCC)   . Frequent UTI   . Hypertension   . Kidney cysts   . Myocardial infarction [facility]) 11/2016     Surgical History:  Past Surgical History:  Procedure Laterality Date  . BILATERAL KNEE ARTHROSCOPY    . CORONARY STENT INTERVENTION N/A 11/15/2016   Procedure: Coronary Stent Intervention;  Surgeon: Lyn Records, MD;  Location: [facility] INVASIVE CV LAB;  Service: Cardiovascular;  Laterality: N/A;  Distal RCA  . EYE SURGERY    . LEFT HEART CATH AND CORONARY ANGIOGRAPHY N/A 11/15/2016   Procedure: Left Heart Cath and Coronary Angiography;  Surgeon: Lyn Records, MD;  Location: [facility] INVASIVE CV LAB;  Service: Cardiovascular;  Laterality: N/A;  . TUBAL LIGATION       Medications Prior to Admission  Medication Sig Dispense Refill Last Dose  . Acetaminophen 500 MG coapsule Take 1,000 mg by mouth every 6 (  six) hours as needed for moderate pain or headache.    08/11/2017 at Unknown time  . aspirin 81 MG chewable tablet Chew 1 tablet (81 mg total) by mouth daily. 30 tablet  08/12/2017 at Unknown time  . atorvastatin (LIPITOR) 80 MG tablet Take 1 tablet (80 mg total) by mouth daily at 6 PM. 30 tablet 1 08/12/2017 at Unknown time  . cloNIDine (CATAPRES) 0.1 MG tablet Take 0.1 mg by mouth See admin instructions. Take 0.1 mg by  mouth daily. May take 0.1 mg additional if needed   08/12/2017 at Unknown time  . clopidogrel (PLAVIX) 75 MG tablet Take 1 tablet (75 mg total) by mouth daily with breakfast. 30 tablet 6 Past Month at Unknown time  . enalapril (VASOTEC) 20 MG tablet Take 1 tablet (20 mg total) by mouth daily. 30 tablet 2 08/12/2017 at Unknown time  . metoprolol tartrate (LOPRESSOR) 25 MG tablet TAKE 1/2  TABLET BY MOUTH TWICE DAILY 90 tablet 2 08/12/2017 at 900 AM  . potassium chloride SA (K-DUR,KLOR-CON) 20 MEQ tablet Take 1 tablet (20 mEq total) by mouth daily. 3 tablet 0 08/12/2017 at Unknown time  . zolpidem (AMBIEN) 10 MG tablet Take 10 mg by mouth at bedtime.    08/11/2017 at Unknown time  . ciprofloxacin (CIPRO) 500 MG tablet Take 1 tablet (500 mg total) by mouth 2 (two) times daily. One po bid x 7 days (Patient not taking: Reported on 08/12/2017) 14 tablet 0 Not Taking at Unknown time  . metroNIDAZOLE (FLAGYL) 500 MG tablet Take 1 tablet (500 mg total) by mouth 2 (two) times daily. (Patient not taking: Reported on 08/12/2017) 14 tablet 0 Not Taking at Unknown time  . nitroGLYCERIN (NITROSTAT) 0.4 MG SL tablet Place 1 tablet (0.4 mg total) under the tongue every 5 (five) minutes as needed. 25 tablet 2 ON HAND    Inpatient Medications:  . [MAR Hold]  stroke: mapping our early stages of recovery book   Does not apply Once  . [MAR Hold] aspirin EC  325 mg Oral Daily  . [MAR Hold] atorvastatin  80 mg Oral q1800  . [MAR Hold] cloNIDine  0.1 mg Oral BID  . [MAR Hold] clopidogrel  75 mg Oral Q breakfast  . [MAR Hold] enalapril  20 mg Oral Daily  . [MAR Hold] metoprolol tartrate  12.5 mg Oral BID  . [MAR Hold] potassium chloride SA  20 mEq Oral Daily    Allergies:  Allergies  Allergen Reactions  . Biaxin [Clarithromycin] Other (See Comments)    Unknown  . Enalapril Itching and Other (See Comments)    Pt states she gets hyper  . Fentanyl Other (See Comments)    Unknown  . Hydrochlorothiazide Other (See Comments)     Unknown  . Lasix [Furosemide] Other (See Comments)    Unknown  . Levaquin [Levofloxacin In D5w] Other (See Comments)    Unknown  . Penicillins Other (See Comments)    Has patient had a PCN reaction causing immediate rash, facial/tongue/throat swelling, SOB or lightheadedness with hypotension: Yes Has patient had a PCN reaction causing severe rash involving mucus membranes or skin necrosis: No Has patient had a PCN reaction that required hospitalization: No Has patient had a PCN reaction occurring within the last 10 years: No If all of the above answers are "NO", then may proceed with Cephalosporin use.   Marland Kitchen Percocet [Oxycodone-Acetaminophen] Other (See Comments)    Unknown  . Sulfa Antibiotics Other (See Comments)    Unknown  .  Zyban [Bupropion] Other (See Comments)    Unknown    Social History   Socioeconomic History  . Marital status: Married    Spouse name: Not on file  . Number of children: Not on file  . Years of education: Not on file  . Highest education level: Not on file  Social Needs  . Financial resource strain: Not on file  . Food insecurity - worry: Not on file  . Food insecurity - inability: Not on file  . Transportation needs - medical: Not on file  . Transportation needs - non-medical: Not on file  Occupational History  . Not on file  Tobacco Use  . Smoking status: Current Every Day Smoker    Packs/day: 1.00    Types: Cigarettes  . Smokeless tobacco: Never Used  Substance and Sexual Activity  . Alcohol use: No  . Drug use: No  . Sexual activity: Not on file  Other Topics Concern  . Not on file  Social History Narrative  . Not on file     Family History  Problem Relation Age of Onset  . Hypertension Other       Review of Systems: All other systems reviewed and are otherwise negative except as noted above.  Physical Exam: Vitals:   08/14/17 1455 08/14/17 1606 08/15/17 0506 08/15/17 0811  BP: (!) 158/68 (!) 157/67 (!) 170/63 (!) 184/56    Pulse: (!) 54 (!) 54 65 (!) 57  Resp: 18 18 20 20   Temp:  98.2 F (36.8 C) 97.9 F (36.6 C) 98.4 F (36.9 C)  TempSrc:  Oral Oral Oral  SpO2: 98% 98% 92% 99%  Weight:      Height:        GEN- The patient is well appearing nuy older than her stated age,is alert and oriented x 3   Head- normocephalic, atraumatic Eyes-  Sclera clear, conjunctiva pink Ears- hearing intact Oropharynx- clear Neck- supple Lungs- CTA b/l, normal work of breathing Heart- RRR, no murmurs, rubs or gallops  GI- soft, NT, ND Extremities- no clubbing, cyanosis, or edema MS- no significant deformity , age appropriate atrophy Skin- no rash or lesion Psych- euthymic mood, full affect   Labs:   Lab Results  Component Value Date   WBC 7.8 08/12/2017   HGB 14.4 08/12/2017   HCT 44.1 08/12/2017   MCV 103.3 (H) 08/12/2017   PLT 324 08/12/2017    Recent Labs  Lab 08/12/17 1611  NA 141  K 4.1  CL 109  CO2 24  BUN 22*  CREATININE 1.21*  CALCIUM 8.8*  PROT 7.0  BILITOT 0.7  ALKPHOS 78  ALT 10*  AST 17  GLUCOSE 98   Lab Results  Component Value Date   TROPONINI 0.09 (HH) 11/16/2016   Lab Results  Component Value Date   CHOL 218 (H) 08/15/2017   CHOL 144 11/15/2016   Lab Results  Component Value Date   HDL 34 (L) 08/15/2017   HDL 23 (L) 11/15/2016   Lab Results  Component Value Date   LDLCALC 160 (H) 08/15/2017   LDLCALC 100 (H) 11/15/2016   Lab Results  Component Value Date   TRIG 120 08/15/2017   TRIG 106 11/15/2016   Lab Results  Component Value Date   CHOLHDL 6.4 08/15/2017   CHOLHDL 6.3 11/15/2016   No results found for: LDLDIRECT  Lab Results  Component Value Date   DDIMER  09/07/2007    0.46        AT  THE INHOUSE ESTABLISHED CUTOFF VALUE OF 0.48 ug/mL FEU, THIS ASSAY HAS BEEN DOCUMENTED IN THE LITERATURE TO HAVE     Radiology/Studies:   Ct Angio Head W Or Wo Contrast Result Date: 08/12/2017 CLINICAL DATA:  Flu-like symptoms for 2 weeks with weakness.  Headache. History of hypertension. EXAM: CT ANGIOGRAPHY HEAD TECHNIQUE: Multidetector CT imaging of the head was performed using the standard protocol during bolus administration of intravenous contrast. Multiplanar CT image reconstructions and MIPs were obtained to evaluate the vascular anatomy. CONTRAST:  ISOVUE-370 IOPAMIDOL (ISOVUE-370) INJECTION 76% COMPARISON:  MRI/MRA head March 18, 2015. CT HEAD March 18, 2015 FINDINGS: CT HEAD BRAIN: No intraparenchymal hemorrhage, mass effect nor midline shift. The ventricles and sulci are normal for age. Confluent supratentorial white matter hypodensities. Faint hypodensities bilateral basal ganglia and thalami associated with chronic small vessel ischemic disease. New nonacute antrum mesial RIGHT frontal lobe encephalomalacia. No acute large vascular territory infarcts. No abnormal extra-axial fluid collections. Basal cisterns are patent. VASCULAR: Mild calcific atherosclerosis of the carotid siphons. SKULL: No skull fracture. Mild RIGHT temporomandibular osteoarthrosis. No significant scalp soft tissue swelling. SINUSES/ORBITS: Trace paranasal sinus mucosal thickening with small mucosal retention cyst. Trace bilateral mastoid effusions. Included ocular globes and orbital contents are non-suspicious. Status post bilateral ocular lens implants. OTHER: None. CTA HEAD ANTERIOR CIRCULATION: Patent cervical internal carotid arteries, petrous, cavernous and supra clinoid internal carotid arteries. Patent anterior communicating artery. Patent anterior and middle cerebral arteries. Severe tandem stenosis LEFT A2 segment and severe stenosis RIGHT A3 segment. Moderate and severe tandem stenosis bilateral middle cerebral arteries. No large vessel occlusion,  contrast extravasation or aneurysm. POSTERIOR CIRCULATION: Patent vertebral arteries, vertebrobasilar junction and basilar artery, as well as main branch vessels. Patent posterior cerebral arteries. Scattered severe  stenosis mid to distal posterior cerebral arteries. No large vessel occlusion, contrast extravasation or aneurysm. VENOUS SINUSES: Major dural venous sinuses are patent though not tailored for evaluation on this angiographic examination. ANATOMIC VARIANTS: None. DELAYED PHASE: No abnormal intracranial enhancement. MIP images reviewed. IMPRESSION: CT HEAD: 1. No acute intracranial process. 2. New nonacute small RIGHT frontal lobe/ACA territory infarct. 3. Progressed moderate to severe chronic small vessel ischemic disease. CTA HEAD: 1. No emergent large vessel occlusion. 2. Moderate atherosclerosis with superimposed multifocal severe stenosis bilateral anterior, middle and posterior cerebral arteries. Electronically Signed   By: Awilda Metro M.D.   On: 08/12/2017 19:25    Dg Chest 2 View Result Date: 08/12/2017 CLINICAL DATA:  Weakness and flu like symptoms. EXAM: CHEST - 2 VIEW COMPARISON:  11/18/2013 and prior chest radiographs FINDINGS: This is a mildly low volume film. The cardiomediastinal silhouette is unremarkable. Mild peribronchial thickening is unchanged. There is no evidence of focal airspace disease, pulmonary edema, suspicious pulmonary nodule/mass, pleural effusion, or pneumothorax. No acute bony abnormalities are identified. IMPRESSION: No active cardiopulmonary disease. Electronically Signed   By: Harmon Pier M.D.   On: 08/12/2017 17:36   Mr Brain Wo Contrast Result Date: 08/13/2017 CLINICAL DATA:  Weakness and slurred speech. EXAM: MRI HEAD WITHOUT CONTRAST TECHNIQUE: Multiplanar, multiecho pulse sequences of the brain and surrounding structures were obtained without intravenous contrast. COMPARISON:  Head CT 08/12/2017 and MRI 03/18/2015 FINDINGS: Brain: There is a small acute left paramedian pontine infarct. There are also punctate foci of trace diffusion signal hyperintensity in the right lateral lenticulostriate territory extending from the superior aspect of the right lentiform nucleus  to the caudate body with normal ADC suggesting subacute infarcts. No intracranial hemorrhage, mass, midline shift, or  extra-axial fluid collection is identified. A small chronic anteromedial right frontal lobe infarct is new from 2016. Patchy to confluent T2 hyperintensities elsewhere in the cerebral white matter bilaterally and in the pons have slightly progressed from 2016 and are nonspecific but compatible with moderate chronic small vessel ischemic disease. There are multiple chronic lacunar infarcts in the basal ganglia and thalami which are new from 2016. Generalized cerebral atrophy is stable to slightly progressed from 2016. Vascular: Major intracranial vascular flow voids are preserved. Skull and upper cervical spine: Unremarkable bone marrow signal. Sinuses/Orbits: Bilateral cataract extraction. Trace right mastoid effusion. Clear paranasal sinuses. Other: None. IMPRESSION: 1. Acute left pontine infarct. 2. Tiny subacute right lateral lenticulostriate territory infarcts. 3. Chronic anterior right frontal infarct, new from 2016. 4. Progressive chronic small vessel ischemia from 2016. Electronically Signed   By: Sebastian Ache M.D.   On: 08/13/2017 07:17    12-lead ECG SR All prior EKG's in [facility] reviewed with no documented atrial fibrillation  Telemetry SB 50's typically  Assessment and Plan:  1. Cryptogenic stroke The patient presents with cryptogenic stroke.  The patient has a TEE planned for this AM.  I spoke at length with the patient about monitoring for afib with either a 30 day event monitor or an implantable loop recorder.  Risks, benefits, and alteratives to implantable loop recorder were discussed with the patient today.   At this time, the patient is very clear in her decision to proceed with implantable loop recorder.   Wound care was reviewed with the patient (keep incision clean and dry for 3 days).  Wound check will be scheduled for the patient  Remote hx of DVT prompting  hypercoagulable workup--pending   Please call with questions.  2. Ongoing smoker     counseled   Sheilah Pigeon, PA-C 08/15/2017  Seen and agree  Hx Pe an problem list updated  For loop recorder for Cryptogenic Stroke

## 2017-08-16 ENCOUNTER — Encounter (HOSPITAL_COMMUNITY): Payer: Self-pay | Admitting: Internal Medicine

## 2017-08-16 LAB — CARDIOLIPIN ANTIBODIES, IGG, IGM, IGA
Anticardiolipin IgG: <9 GPL U/mL (ref 0–14)
Anticardiolipin IgM: <9 MPL U/mL (ref 0–12)

## 2017-08-16 LAB — PROTEIN C, TOTAL: Protein C, Total: 104 % (ref 60–150)

## 2017-08-18 ENCOUNTER — Other Ambulatory Visit: Payer: Self-pay

## 2017-08-18 LAB — PROTHROMBIN GENE MUTATION

## 2017-08-18 NOTE — Patient Outreach (Signed)
[facility]) Care Management  08/18/2017  Mackenzie Richards 12/11/1940 [id number]   EMMI- Stroke RED ON EMMI ALERT Day #1 Date: 08/17/17 Red Alert Reason:  Scheduled a follow up appointment? No Problems setting up rehab? Didn't need any.   Telephone call to patient for EMMI follow up.  Spoke with son Mackenzie Richards on the phone.  He got patient to the phone but patient heard crying. Advised patient that if she gave permission that CM could talk with son.  Patient gave permission to speak with son.  He is able to verify HIPAA.  He states that patient has emotional episodes and that they are more frequent with this last stroke.  Asked him if patient has follow up appointments to physician.  He tried asking patient but he was having some difficulty and asked if CM could call back while he got the information together.  Advised that CM would call back.    11:20 am.  Telephone call back to son Mackenzie Richards.  He is able to verify HIPAA. He sates that patient has lot of discharge papers. He states that Dr. Cory Roughen has retired but a call has been placed to the office for follow up but no appointment.  Patient has a pacer appointment on 08-29-17. No other appointments have beenset up.  Son states he has been away for a day but promised he would make some calls to get things straight when it comes to his mom's follow up appointments.  He did have a question about his mother's Enalapril.  He states that she is allergic to that and wondered if she should be taking it. Advised that he would need to talk with the primary office about that as she needs some replacement as it is an important medication for blood pressure.  He verbalized understanding and reiterates he will be calling right away to handle things.    Asked him about home health.  He asked his mom and she states they called but she could not get to the phone fast enough.  He says he will follow up on that too.  CM contact information given for  return call from son to advise on follow ups.  Son verbalized understanding.    Plan: RN CM will wait son return call.   Bary Leriche, RN, MSN [facility] Care Management Care Management Coordinator Direct Line [phone] Toll Free: [phone]  Fax: [phone]

## 2017-08-19 LAB — FACTOR 5 LEIDEN

## 2017-08-19 LAB — MTHFR DNA ANALYSIS

## 2017-08-23 ENCOUNTER — Other Ambulatory Visit: Payer: Self-pay

## 2017-08-23 NOTE — Patient Outreach (Signed)
[facility]) Care Management  08/23/2017  Mackenzie Richards [birth year] [id number]   Telephone call to patient for follow up on EMMI.  Spoke with patient she is able to verify HIPAA. Patient able to talk today despite some slurred speech.  Asked patient about follow up appointments. Patient states she has called the neurologist and left a message for a follow up appointment.  Patient has not seen her primary care doctor but sees Mackenzie Richards at [facility] as her primary physician.  Advised patient that she needed to follow up with her.  She states she will be calling.  Patient states she is still independent with her ADL's.  Patient knowledgeable about her medications.  Asked patient about her enalapril.  She says she has a weird reaction and does not take it.  She states she is taking clonidine and metoprolol in place of it.  Patient states she ran out of her potassium and she called the pharmacy yesterday and they have sent refill request to the doctor per patient.  Asked patient if she had a questions or concerns. She denies any questions and states she knows that after a stroke care is important and will be doing what she needs to do.    Plan: RN CM will close case and notify care management assistant of case status.    Mackenzie Leriche, RN, MSN [facility] Care Management Care Management Coordinator Direct Line [phone] Toll Free: [phone]  Fax: [phone]

## 2017-08-29 ENCOUNTER — Ambulatory Visit: Payer: Medicare HMO

## 2017-09-07 ENCOUNTER — Encounter: Payer: Self-pay | Admitting: Interventional Cardiology

## 2017-09-14 ENCOUNTER — Ambulatory Visit (INDEPENDENT_AMBULATORY_CARE_PROVIDER_SITE_OTHER): Payer: Medicare HMO | Admitting: *Deleted

## 2017-09-14 DIAGNOSIS — I639 Cerebral infarction, unspecified: Secondary | ICD-10-CM | POA: Diagnosis not present

## 2017-09-15 ENCOUNTER — Ambulatory Visit: Payer: Self-pay | Admitting: Adult Health

## 2017-09-15 NOTE — Progress Notes (Signed)
Carelink Summary Report / Loop Recorder 

## 2017-09-29 ENCOUNTER — Ambulatory Visit: Payer: Self-pay | Admitting: Adult Health

## 2017-09-29 ENCOUNTER — Encounter: Payer: Self-pay | Admitting: Adult Health

## 2017-10-17 ENCOUNTER — Ambulatory Visit (INDEPENDENT_AMBULATORY_CARE_PROVIDER_SITE_OTHER): Payer: Medicare HMO | Admitting: *Deleted

## 2017-10-17 DIAGNOSIS — I639 Cerebral infarction, unspecified: Secondary | ICD-10-CM

## 2017-10-18 NOTE — Progress Notes (Signed)
Carelink Summary Report / Loop Recorder 

## 2017-10-19 LAB — CUP PACEART REMOTE DEVICE CHECK
Date Time Interrogation Session: 20190410154143
Date Time Interrogation Session: 20190513161009
Implantable Pulse Generator Implant Date: 20190311
Implantable Pulse Generator Implant Date: 20190311

## 2017-11-02 ENCOUNTER — Inpatient Hospital Stay (HOSPITAL_COMMUNITY)
Admission: EM | Admit: 2017-11-02 | Discharge: 2017-11-06 | DRG: 481 | Disposition: A | Discharge status: Home Health | Payer: Medicare HMO | Attending: Internal Medicine | Admitting: Internal Medicine

## 2017-11-02 ENCOUNTER — Emergency Department (HOSPITAL_COMMUNITY): Payer: Medicare HMO

## 2017-11-02 ENCOUNTER — Encounter (HOSPITAL_COMMUNITY): Payer: Self-pay | Admitting: Emergency Medicine

## 2017-11-02 DIAGNOSIS — Z66 Do not resuscitate: Secondary | ICD-10-CM | POA: Diagnosis present

## 2017-11-02 DIAGNOSIS — R296 Repeated falls: Secondary | ICD-10-CM | POA: Diagnosis present

## 2017-11-02 DIAGNOSIS — Z881 Allergy status to other antibiotic agents status: Secondary | ICD-10-CM | POA: Diagnosis not present

## 2017-11-02 DIAGNOSIS — I200 Unstable angina: Secondary | ICD-10-CM | POA: Diagnosis present

## 2017-11-02 DIAGNOSIS — E785 Hyperlipidemia, unspecified: Secondary | ICD-10-CM | POA: Diagnosis present

## 2017-11-02 DIAGNOSIS — Z79899 Other long term (current) drug therapy: Secondary | ICD-10-CM

## 2017-11-02 DIAGNOSIS — Z888 Allergy status to other drugs, medicaments and biological substances status: Secondary | ICD-10-CM

## 2017-11-02 DIAGNOSIS — Z7902 Long term (current) use of antithrombotics/antiplatelets: Secondary | ICD-10-CM

## 2017-11-02 DIAGNOSIS — Z955 Presence of coronary angioplasty implant and graft: Secondary | ICD-10-CM | POA: Diagnosis not present

## 2017-11-02 DIAGNOSIS — Z882 Allergy status to sulfonamides status: Secondary | ICD-10-CM

## 2017-11-02 DIAGNOSIS — Z86718 Personal history of other venous thrombosis and embolism: Secondary | ICD-10-CM

## 2017-11-02 DIAGNOSIS — W19XXXA Unspecified fall, initial encounter: Secondary | ICD-10-CM | POA: Diagnosis not present

## 2017-11-02 DIAGNOSIS — Z7982 Long term (current) use of aspirin: Secondary | ICD-10-CM

## 2017-11-02 DIAGNOSIS — Z8673 Personal history of transient ischemic attack (TIA), and cerebral infarction without residual deficits: Secondary | ICD-10-CM

## 2017-11-02 DIAGNOSIS — S72001A Fracture of unspecified part of neck of right femur, initial encounter for closed fracture: Secondary | ICD-10-CM

## 2017-11-02 DIAGNOSIS — F17210 Nicotine dependence, cigarettes, uncomplicated: Secondary | ICD-10-CM | POA: Diagnosis present

## 2017-11-02 DIAGNOSIS — Z885 Allergy status to narcotic agent status: Secondary | ICD-10-CM

## 2017-11-02 DIAGNOSIS — S72141A Displaced intertrochanteric fracture of right femur, initial encounter for closed fracture: Secondary | ICD-10-CM | POA: Diagnosis present

## 2017-11-02 DIAGNOSIS — Z880 Allergy status to penicillin: Secondary | ICD-10-CM

## 2017-11-02 DIAGNOSIS — W010XXA Fall on same level from slipping, tripping and stumbling without subsequent striking against object, initial encounter: Secondary | ICD-10-CM | POA: Diagnosis present

## 2017-11-02 DIAGNOSIS — F17200 Nicotine dependence, unspecified, uncomplicated: Secondary | ICD-10-CM | POA: Diagnosis not present

## 2017-11-02 DIAGNOSIS — J449 Chronic obstructive pulmonary disease, unspecified: Secondary | ICD-10-CM | POA: Diagnosis present

## 2017-11-02 DIAGNOSIS — D62 Acute posthemorrhagic anemia: Secondary | ICD-10-CM | POA: Diagnosis not present

## 2017-11-02 DIAGNOSIS — I10 Essential (primary) hypertension: Secondary | ICD-10-CM | POA: Diagnosis present

## 2017-11-02 DIAGNOSIS — S7290XA Unspecified fracture of unspecified femur, initial encounter for closed fracture: Secondary | ICD-10-CM

## 2017-11-02 DIAGNOSIS — I2510 Atherosclerotic heart disease of native coronary artery without angina pectoris: Secondary | ICD-10-CM | POA: Diagnosis present

## 2017-11-02 DIAGNOSIS — I252 Old myocardial infarction: Secondary | ICD-10-CM | POA: Diagnosis not present

## 2017-11-02 DIAGNOSIS — Y92000 Kitchen of unspecified non-institutional (private) residence as  the place of occurrence of the external cause: Secondary | ICD-10-CM

## 2017-11-02 DIAGNOSIS — Z9119 Patient's noncompliance with other medical treatment and regimen: Secondary | ICD-10-CM

## 2017-11-02 DIAGNOSIS — T148XXA Other injury of unspecified body region, initial encounter: Secondary | ICD-10-CM

## 2017-11-02 HISTORY — DX: Cerebral infarction, unspecified: I63.9

## 2017-11-02 LAB — I-STAT CHEM 8, ED
BUN: 14 mg/dL (ref 6–20)
CHLORIDE: 106 mmol/L (ref 101–111)
CREATININE: 0.9 mg/dL (ref 0.44–1.00)
Calcium, Ion: 1.09 mmol/L — ABNORMAL LOW (ref 1.15–1.40)
GLUCOSE: 123 mg/dL — AB (ref 65–99)
HEMATOCRIT: 41 % (ref 36.0–46.0)
Hemoglobin: 13.9 g/dL (ref 12.0–15.0)
POTASSIUM: 3.8 mmol/L (ref 3.5–5.1)
Sodium: 143 mmol/L (ref 135–145)
TCO2: 23 mmol/L (ref 22–32)

## 2017-11-02 LAB — CBC WITH DIFFERENTIAL/PLATELET
ABS IMMATURE GRANULOCYTES: 0.1 10*3/uL (ref 0.0–0.1)
Basophils Absolute: 0.1 10*3/uL (ref 0.0–0.1)
Basophils Relative: 1 %
Eosinophils Absolute: 0.3 10*3/uL (ref 0.0–0.7)
Eosinophils Relative: 2 %
HEMATOCRIT: 42.3 % (ref 36.0–46.0)
HEMOGLOBIN: 13.5 g/dL (ref 12.0–15.0)
IMMATURE GRANULOCYTES: 1 %
LYMPHS ABS: 3.3 10*3/uL (ref 0.7–4.0)
LYMPHS PCT: 22 %
MCH: 32.2 pg (ref 26.0–34.0)
MCHC: 31.9 g/dL (ref 30.0–36.0)
MCV: 101 fL — AB (ref 78.0–100.0)
MONOS PCT: 5 %
Monocytes Absolute: 0.8 10*3/uL (ref 0.1–1.0)
NEUTROS ABS: 10.6 10*3/uL — AB (ref 1.7–7.7)
Neutrophils Relative %: 69 %
Platelets: 304 10*3/uL (ref 150–400)
RBC: 4.19 MIL/uL (ref 3.87–5.11)
RDW: 14.6 % (ref 11.5–15.5)
WBC: 15.3 10*3/uL — ABNORMAL HIGH (ref 4.0–10.5)

## 2017-11-02 MED ORDER — DOCUSATE SODIUM 100 MG PO CAPS
100.0000 mg | ORAL_CAPSULE | Freq: Two times a day (BID) | ORAL | Status: DC
  Administered 2017-11-02 – 2017-11-06 (×7): 100 mg via ORAL
  Filled 2017-11-02 (×7): qty 1

## 2017-11-02 MED ORDER — IPRATROPIUM-ALBUTEROL 0.5-2.5 (3) MG/3ML IN SOLN: 3.0000 mL | Freq: Four times a day (QID) | RESPIRATORY_TRACT | Status: DC | PRN

## 2017-11-02 MED ORDER — METHOCARBAMOL 500 MG PO TABS
500.0000 mg | ORAL_TABLET | Freq: Four times a day (QID) | ORAL | Status: DC | PRN
  Administered 2017-11-03 – 2017-11-06 (×4): 500 mg via ORAL
  Filled 2017-11-02 (×4): qty 1

## 2017-11-02 MED ORDER — ASPIRIN 325 MG PO TABS
325.0000 mg | ORAL_TABLET | Freq: Every day | ORAL | Status: DC
  Administered 2017-11-04 – 2017-11-06 (×3): 325 mg via ORAL
  Filled 2017-11-02 (×4): qty 1

## 2017-11-02 MED ORDER — METHOCARBAMOL 1000 MG/10ML IJ SOLN
500.0000 mg | Freq: Four times a day (QID) | INTRAMUSCULAR | Status: DC | PRN
  Filled 2017-11-02: qty 5

## 2017-11-02 MED ORDER — ZOLPIDEM TARTRATE 5 MG PO TABS
5.0000 mg | ORAL_TABLET | Freq: Every evening | ORAL | Status: DC | PRN
  Administered 2017-11-05: 5 mg via ORAL
  Filled 2017-11-02: qty 1

## 2017-11-02 MED ORDER — FLEET ENEMA 7-19 GM/118ML RE ENEM: 1.0000 | ENEMA | Freq: Once | RECTAL | Status: DC | PRN

## 2017-11-02 MED ORDER — ATORVASTATIN CALCIUM 80 MG PO TABS
80.0000 mg | ORAL_TABLET | Freq: Every day | ORAL | Status: DC
  Administered 2017-11-02 – 2017-11-05 (×4): 80 mg via ORAL
  Filled 2017-11-02 (×4): qty 1

## 2017-11-02 MED ORDER — SODIUM CHLORIDE 0.9 % IV SOLN
INTRAVENOUS | Status: DC
  Administered 2017-11-02: 06:00:00 via INTRAVENOUS

## 2017-11-02 MED ORDER — METOPROLOL TARTRATE 25 MG PO TABS
25.0000 mg | ORAL_TABLET | Freq: Two times a day (BID) | ORAL | Status: DC
  Administered 2017-11-02 – 2017-11-06 (×8): 25 mg via ORAL
  Filled 2017-11-02 (×8): qty 1

## 2017-11-02 MED ORDER — HYDROCODONE-ACETAMINOPHEN 5-325 MG PO TABS
1.0000 | ORAL_TABLET | Freq: Four times a day (QID) | ORAL | Status: DC | PRN
  Administered 2017-11-05: 1 via ORAL
  Filled 2017-11-02 (×3): qty 1

## 2017-11-02 MED ORDER — BISACODYL 5 MG PO TBEC: 5.0000 mg | DELAYED_RELEASE_TABLET | Freq: Every day | ORAL | Status: DC | PRN

## 2017-11-02 MED ORDER — CHLORHEXIDINE GLUCONATE 4 % EX LIQD
60.0000 mL | Freq: Once | CUTANEOUS | Status: DC
  Filled 2017-11-02: qty 60

## 2017-11-02 MED ORDER — POLYETHYLENE GLYCOL 3350 17 G PO PACK: 17.0000 g | PACK | Freq: Every day | ORAL | Status: DC | PRN

## 2017-11-02 MED ORDER — LIDOCAINE 5 % EX PTCH
2.0000 | MEDICATED_PATCH | CUTANEOUS | Status: DC
  Administered 2017-11-02 – 2017-11-06 (×3): 2 via TRANSDERMAL
  Filled 2017-11-02 (×5): qty 2

## 2017-11-02 MED ORDER — ACETAMINOPHEN 325 MG PO TABS
650.0000 mg | ORAL_TABLET | Freq: Four times a day (QID) | ORAL | Status: DC | PRN
  Administered 2017-11-02 – 2017-11-06 (×7): 650 mg via ORAL
  Filled 2017-11-02 (×8): qty 2

## 2017-11-02 MED ORDER — MORPHINE SULFATE (PF) 4 MG/ML IV SOLN
2.0000 mg | INTRAVENOUS | Status: DC | PRN
  Filled 2017-11-02: qty 1

## 2017-11-02 MED ORDER — KETOROLAC TROMETHAMINE 30 MG/ML IJ SOLN
15.0000 mg | Freq: Once | INTRAMUSCULAR | Status: AC
  Administered 2017-11-02: 15 mg via INTRAVENOUS
  Filled 2017-11-02: qty 1

## 2017-11-02 MED ORDER — CLINDAMYCIN PHOSPHATE 900 MG/50ML IV SOLN
900.0000 mg | INTRAVENOUS | Status: AC
  Administered 2017-11-03: 900 mg via INTRAVENOUS
  Filled 2017-11-02: qty 50

## 2017-11-02 MED ORDER — NICOTINE 21 MG/24HR TD PT24
21.0000 mg | MEDICATED_PATCH | Freq: Every day | TRANSDERMAL | Status: DC
Start: 2017-11-02 — End: 2017-11-06
  Administered 2017-11-03 – 2017-11-06 (×4): 21 mg via TRANSDERMAL
  Filled 2017-11-02 (×4): qty 1

## 2017-11-02 MED ORDER — CLONIDINE HCL 0.1 MG PO TABS
0.1000 mg | ORAL_TABLET | Freq: Two times a day (BID) | ORAL | Status: DC
  Administered 2017-11-02 – 2017-11-06 (×8): 0.1 mg via ORAL
  Filled 2017-11-02 (×8): qty 1

## 2017-11-02 MED ORDER — POVIDONE-IODINE 10 % EX SWAB: 2.0000 "application " | Freq: Once | CUTANEOUS | Status: DC

## 2017-11-02 NOTE — Progress Notes (Signed)
Orthopedic Tech Progress Note Patient Details:  Mackenzie Richards 16-Dec-1940 [id number]  Musculoskeletal Traction Type of Traction: Bucks Skin Traction Traction Location: rle Traction Weight: 10 lbs   Post Interventions Patient Tolerated: Well Instructions Provided: Care of device   Nikki Dom 11/02/2017, 10:08 AM

## 2017-11-02 NOTE — ED Provider Notes (Signed)
[facility] EMERGENCY DEPARTMENT Provider Note   CSN: [id number] Arrival date & time: 11/02/17  0443     History   Chief Complaint Chief Complaint  Patient presents with  . Fall  . Hip Pain    HPI Mackenzie Richards is a 77 y.o. female.  The history is provided by the patient.  Fall  This is a new problem. The current episode started less than 1 hour ago. The problem occurs constantly. The problem has not changed since onset.Pertinent negatives include no chest pain, no abdominal pain, no headaches and no shortness of breath. Nothing aggravates the symptoms. Nothing relieves the symptoms. She has tried nothing for the symptoms. The treatment provided no relief.  Hip Pain  This is a new problem. The current episode started less than 1 hour ago. The problem occurs constantly. The problem has not changed since onset.Pertinent negatives include no chest pain, no abdominal pain, no headaches and no shortness of breath. Nothing aggravates the symptoms. Nothing relieves the symptoms. She has tried nothing for the symptoms. The treatment provided no relief.    Past Medical History:  Diagnosis Date  . Arthritis   . Asthma   . Bowen's disease   . Breast cyst   . Complication of anesthesia    i AM SLOW TO WAKE UP   . Coronary artery disease   . Diverticulitis   . DVT (deep venous thrombosis) (HCC)   . Frequent UTI   . Hypertension   . Kidney cysts   . Myocardial infarction [facility]) 11/2016    Patient Active Problem List   Diagnosis Date Noted  . History of DVT (deep vein thrombosis)   . Essential hypertension   . Hyperlipidemia   . Acute CVA (cerebrovascular accident) (HCC) 08/12/2017  . CAD (coronary artery disease) 08/12/2017  . Acute ST elevation myocardial infarction (STEMI) of inferior wall (HCC)   . Aborted myocardial infarction (HCC)   . 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 Asthma 03/18/2015  . Dyslipidemia 03/18/2015  . History of coronary artery  stent placement (2009/2010) 03/18/2015  . Smoker 03/18/2015  . Aphasia     Past Surgical History:  Procedure Laterality Date  . BILATERAL KNEE ARTHROSCOPY    . CORONARY STENT INTERVENTION N/A 11/15/2016   Procedure: Coronary Stent Intervention;  Surgeon: Lyn Records, MD;  Location: [facility] INVASIVE CV LAB;  Service: Cardiovascular;  Laterality: N/A;  Distal RCA  . EYE SURGERY    . LEFT HEART CATH AND CORONARY ANGIOGRAPHY N/A 11/15/2016   Procedure: Left Heart Cath and Coronary Angiography;  Surgeon: Lyn Records, MD;  Location: [facility] INVASIVE CV LAB;  Service: Cardiovascular;  Laterality: N/A;  . LOOP RECORDER INSERTION N/A 08/15/2017   Procedure: LOOP RECORDER INSERTION;  Surgeon: Duke Salvia, MD;  Location: [facility] INVASIVE CV LAB;  Service: Cardiovascular;  Laterality: N/A;  . TEE WITHOUT CARDIOVERSION N/A 08/15/2017   Procedure: TRANSESOPHAGEAL ECHOCARDIOGRAM (TEE);  Surgeon: Lewayne Bunting, MD;  Location: [facility] ENDOSCOPY;  Service: Cardiovascular;  Laterality: N/A;  . TUBAL LIGATION       OB History   None      Home Medications    Prior to Admission medications   Medication Sig Start Date End Date Taking? Authorizing Provider  aspirin 81 MG chewable tablet Chew 1 tablet (81 mg total) by mouth daily. 08/15/17   Darlin Drop, DO  atorvastatin (LIPITOR) 80 MG tablet Take 1 tablet (80 mg total) by mouth daily at 6  PM. 08/15/17   Darlin Drop, DO  cloNIDine (CATAPRES) 0.1 MG tablet Take 1 tablet (0.1 mg total) by mouth 2 (two) times daily. 08/15/17   Darlin Drop, DO  clopidogrel (PLAVIX) 75 MG tablet Take 1 tablet (75 mg total) by mouth daily with breakfast. 08/16/17   Darlin Drop, DO  enalapril (VASOTEC) 20 MG tablet Take 1 tablet (20 mg total) by mouth daily. Patient not taking: Reported on 08/23/2017 08/16/17   Darlin Drop, DO  metoprolol tartrate (LOPRESSOR) 25 MG tablet Take 1 tablet (25 mg total) by mouth 2 (two) times daily. 08/15/17   Darlin Drop, DO  nitroGLYCERIN  (NITROSTAT) 0.4 MG SL tablet Place 1 tablet (0.4 mg total) under the tongue every 5 (five) minutes as needed. 11/17/16   Arty Baumgartner, NP  potassium chloride SA (K-DUR,KLOR-CON) 20 MEQ tablet Take 1 tablet (20 mEq total) by mouth daily. 11/13/16   Fayrene Helper, PA-C    Family History Family History  Problem Relation Age of Onset  . Hypertension Other     Social History Social History   Tobacco Use  . Smoking status: Current Every Day Smoker    Packs/day: 1.00    Types: Cigarettes  . Smokeless tobacco: Never Used  Substance Use Topics  . Alcohol use: No  . Drug use: No     Allergies   Biaxin [clarithromycin]; Enalapril; Fentanyl; Hydrochlorothiazide; Lasix [furosemide]; Levaquin [levofloxacin in d5w]; Penicillins; Percocet [oxycodone-acetaminophen]; Sulfa antibiotics; and Zyban [bupropion]   Review of Systems Review of Systems  Respiratory: Negative for shortness of breath.   Cardiovascular: Negative for chest pain.  Gastrointestinal: Negative for abdominal pain.  Musculoskeletal: Positive for arthralgias.  Neurological: Negative for headaches.  All other systems reviewed and are negative.    Physical Exam Updated Vital Signs BP (!) 189/72 (BP Location: Left Arm)   Pulse 60   Temp 98.7 F (37.1 C) (Oral)   Resp 18   SpO2 96%   Physical Exam  Constitutional: She is oriented to person, place, and time. She appears well-developed and well-nourished. No distress.  HENT:  Head: Normocephalic and atraumatic.  Nose: Nose normal.  Mouth/Throat: No oropharyngeal exudate.  Eyes: Pupils are equal, round, and reactive to light. Conjunctivae are normal.  Neck: Normal range of motion. Neck supple.  Cardiovascular: Normal rate, regular rhythm, normal heart sounds and intact distal pulses.  Pulmonary/Chest: Effort normal and breath sounds normal. No respiratory distress.  Abdominal: Soft. Bowel sounds are normal. She exhibits no mass. There is no tenderness. There is no  rebound and no guarding.  Musculoskeletal: She exhibits deformity.  RUE externally rotated and foreshortened   Neurological: She is alert and oriented to person, place, and time. She displays normal reflexes.  Skin: Skin is warm and dry. Capillary refill takes less than 2 seconds.  Psychiatric: She has a normal mood and affect.     ED Treatments / Results  Labs (all labs ordered are listed, but only abnormal results are displayed) Results for orders placed or performed during the hospital encounter of 11/02/17  CBC with Differential/Platelet  Result Value Ref Range   WBC 15.3 (H) 4.0 - 10.5 K/uL   RBC 4.19 3.87 - 5.11 MIL/uL   Hemoglobin 13.5 12.0 - 15.0 g/dL   HCT 69.6 29.5 - 28.4 %   MCV 101.0 (H) 78.0 - 100.0 fL   MCH 32.2 26.0 - 34.0 pg   MCHC 31.9 30.0 - 36.0 g/dL   RDW 13.2 44.0 -  15.5 %   Platelets 304 150 - 400 K/uL   Neutrophils Relative % 69 %   Neutro Abs 10.6 (H) 1.7 - 7.7 K/uL   Lymphocytes Relative 22 %   Lymphs Abs 3.3 0.7 - 4.0 K/uL   Monocytes Relative 5 %   Monocytes Absolute 0.8 0.1 - 1.0 K/uL   Eosinophils Relative 2 %   Eosinophils Absolute 0.3 0.0 - 0.7 K/uL   Basophils Relative 1 %   Basophils Absolute 0.1 0.0 - 0.1 K/uL   Immature Granulocytes 1 %   Abs Immature Granulocytes 0.1 0.0 - 0.1 K/uL  I-Stat Chem 8, ED  Result Value Ref Range   Sodium 143 135 - 145 mmol/L   Potassium 3.8 3.5 - 5.1 mmol/L   Chloride 106 101 - 111 mmol/L   BUN 14 6 - 20 mg/dL   Creatinine, Ser 4.49 0.44 - 1.00 mg/dL   Glucose, Bld 675 (H) 65 - 99 mg/dL   Calcium, Ion 9.16 (L) 1.15 - 1.40 mmol/L   TCO2 23 22 - 32 mmol/L   Hemoglobin 13.9 12.0 - 15.0 g/dL   HCT 38.4 66.5 - 99.3 %   Dg Chest 1 View  Result Date: 11/02/2017 CLINICAL DATA:  Status post fall, with shortness of breath. Initial encounter. EXAM: CHEST  1 VIEW COMPARISON:  Chest radiograph performed 08/12/2017 FINDINGS: The lungs are well-aerated. Minimal right basilar airspace opacity likely reflects  atelectasis. There is no evidence of pleural effusion or pneumothorax. The cardiomediastinal silhouette is within normal limits. No acute osseous abnormalities are seen. IMPRESSION: Minimal right basilar airspace opacity likely reflects atelectasis. Lungs otherwise clear. Electronically Signed   By: Roanna Raider M.D.   On: 11/02/2017 06:08   Ct Head Wo Contrast  Result Date: 11/02/2017 CLINICAL DATA:  77 y/o F; mechanical fall. Patient on blood thinners. EXAM: CT HEAD WITHOUT CONTRAST CT CERVICAL SPINE WITHOUT CONTRAST TECHNIQUE: Multidetector CT imaging of the head and cervical spine was performed following the standard protocol without intravenous contrast. Multiplanar CT image reconstructions of the cervical spine were also generated. COMPARISON:  08/13/2017 MRI head. FINDINGS: CT HEAD FINDINGS Brain: No evidence of acute infarction, hemorrhage, hydrocephalus, extra-axial collection or mass lesion/mass effect. Stable small chronic infarction in the right anterior frontal lobe. Small chronic lacunar infarcts are present in the right caudate head, right thalamus, and pons. Stable chronic microvascular ischemic changes and parenchymal volume loss of the brain given differences in technique. Vascular: Calcific atherosclerosis of carotid siphons. No hyperdense vessel identified. Skull: Normal. Negative for fracture or focal lesion. Sinuses/Orbits: No acute finding. Other: None. CT CERVICAL SPINE FINDINGS Alignment: Mild dextrocurvature of the cervical spine with apex at C4. C7-T1 grade 1 anterolisthesis. Skull base and vertebrae: No acute fracture. No primary bone lesion or focal pathologic process. Soft tissues and spinal canal: No prevertebral fluid or swelling. No visible canal hematoma. Disc levels: Cervical spondylosis with discogenic degenerative changes greatest at the C5-6 level and multilevel facet hypertrophy. Moderate C5-6 canal stenosis. Uncovertebral and facet hypertrophy results in bony foraminal  stenosis bilateral at the C3 through C7 levels. Upper chest: Mild centrilobular emphysema of the lung apices. Other: 7 mm nodule in the right lobe of the thyroid. Calcific atherosclerosis of the bilateral carotid bifurcations. IMPRESSION: 1. No acute intracranial abnormality or calvarial fracture. 2. No acute fracture or dislocation of the cervical spine. 3. Stable chronic microvascular ischemic changes and small chronic infarctions in the brain. 4. Cervical spondylosis greatest at the C5-6 level where there is moderate  bony canal stenosis. Electronically Signed   By: Mitzi Hansen M.D.   On: 11/02/2017 05:47   Ct Cervical Spine Wo Contrast  Result Date: 11/02/2017 CLINICAL DATA:  77 y/o F; mechanical fall. Patient on blood thinners. EXAM: CT HEAD WITHOUT CONTRAST CT CERVICAL SPINE WITHOUT CONTRAST TECHNIQUE: Multidetector CT imaging of the head and cervical spine was performed following the standard protocol without intravenous contrast. Multiplanar CT image reconstructions of the cervical spine were also generated. COMPARISON:  08/13/2017 MRI head. FINDINGS: CT HEAD FINDINGS Brain: No evidence of acute infarction, hemorrhage, hydrocephalus, extra-axial collection or mass lesion/mass effect. Stable small chronic infarction in the right anterior frontal lobe. Small chronic lacunar infarcts are present in the right caudate head, right thalamus, and pons. Stable chronic microvascular ischemic changes and parenchymal volume loss of the brain given differences in technique. Vascular: Calcific atherosclerosis of carotid siphons. No hyperdense vessel identified. Skull: Normal. Negative for fracture or focal lesion. Sinuses/Orbits: No acute finding. Other: None. CT CERVICAL SPINE FINDINGS Alignment: Mild dextrocurvature of the cervical spine with apex at C4. C7-T1 grade 1 anterolisthesis. Skull base and vertebrae: No acute fracture. No primary bone lesion or focal pathologic process. Soft tissues and spinal  canal: No prevertebral fluid or swelling. No visible canal hematoma. Disc levels: Cervical spondylosis with discogenic degenerative changes greatest at the C5-6 level and multilevel facet hypertrophy. Moderate C5-6 canal stenosis. Uncovertebral and facet hypertrophy results in bony foraminal stenosis bilateral at the C3 through C7 levels. Upper chest: Mild centrilobular emphysema of the lung apices. Other: 7 mm nodule in the right lobe of the thyroid. Calcific atherosclerosis of the bilateral carotid bifurcations. IMPRESSION: 1. No acute intracranial abnormality or calvarial fracture. 2. No acute fracture or dislocation of the cervical spine. 3. Stable chronic microvascular ischemic changes and small chronic infarctions in the brain. 4. Cervical spondylosis greatest at the C5-6 level where there is moderate bony canal stenosis. Electronically Signed   By: Mitzi Hansen M.D.   On: 11/02/2017 05:47   Dg Hip Unilat  With Pelvis 2-3 Views Right  Result Date: 11/02/2017 CLINICAL DATA:  77 y/o  F; fall tonight, right hip pain. EXAM: DG HIP (WITH OR WITHOUT PELVIS) 2-3V RIGHT COMPARISON:  None. FINDINGS: Acute mildly displaced and comminuted right femur intertrochanteric fracture with medial displacement of the lesser trochanter. No hip dislocation. No pelvic fracture or diastases identified. IMPRESSION: Acute mildly displaced and comminuted right femur intertrochanteric fracture with medial displacement of the lesser trochanter. Electronically Signed   By: Mitzi Hansen M.D.   On: 11/02/2017 06:02     Procedures Procedures (including critical care time)   Case d/w Dr. Jena Gauss who will see the patient   Final Clinical Impressions(s) / ED Diagnoses   Intertrochanteric hip fracture with multiple allergies, will admit to medicine    Alfonza Toft, MD 11/02/17 [id number]

## 2017-11-02 NOTE — ED Notes (Signed)
Patient requesting to use restroom. Patient assisted with use of purewick

## 2017-11-02 NOTE — ED Notes (Signed)
Patient transported to CT 

## 2017-11-02 NOTE — H&P (Signed)
History and Physical    Mackenzie Richards ZOX:[id number] DOB: July 23, 1940 DOA: 11/02/2017  PCP: [name], NP; Vernie Murders Consultants:  Dorena Cookey - cardiology; Abbott - neurology Patient coming from:  Home - lives with husband, step-son, son; Jackey Loge: Husband, [phone]  Chief Complaint:  Fall  HPI: Mackenzie Richards is a 77 y.o. female with medical history significant of CAD with stents; HTN; HLD; COPD with ongoing tobacco use; and acute CVA (pontine infarct) in 3/19 presenting with hip pain after a mechanical fall.  She wanted a cup of coffee.  She went into the kitchen.  She turned around and lost her balance and landed on the floor on her right hip.  She was feeling okay before the fall but she does have a problem with movement sometimes.  She had a CVA about 2 months ago and it affects her balance.  She has a loop recorder in place.  She was prescribed ASA and Plavix - but Plavix is not on her medication list at this time.   ED Course:  Hip fracture - Dr. Jena Gauss consulted and ortho will see patient.  Review of Systems: As per HPI; otherwise review of systems reviewed and negative.   Ambulatory Status:  Ambulates without assistance or with a walker (she has not used it for the last week)  Past Medical History:  Diagnosis Date  . Arthritis   . Asthma   . Bowen's disease   . Breast cyst   . Complication of anesthesia    i AM SLOW TO WAKE UP   . Coronary artery disease   . CVA (cerebral vascular accident) (HCC) 08/2017  . Diverticulitis   . DVT (deep venous thrombosis) (HCC)   . Frequent UTI   . Hypertension   . Kidney cysts   . Myocardial infarction [facility]) 11/2016    Past Surgical History:  Procedure Laterality Date  . BILATERAL KNEE ARTHROSCOPY    . CORONARY STENT INTERVENTION N/A 11/15/2016   Procedure: Coronary Stent Intervention;  Surgeon: Lyn Records, MD;  Location: [facility] INVASIVE CV LAB;  Service: Cardiovascular;  Laterality: N/A;  Distal RCA  . EYE SURGERY    .  LEFT HEART CATH AND CORONARY ANGIOGRAPHY N/A 11/15/2016   Procedure: Left Heart Cath and Coronary Angiography;  Surgeon: Lyn Records, MD;  Location: [facility] INVASIVE CV LAB;  Service: Cardiovascular;  Laterality: N/A;  . LOOP RECORDER INSERTION N/A 08/15/2017   Procedure: LOOP RECORDER INSERTION;  Surgeon: Duke Salvia, MD;  Location: [facility] INVASIVE CV LAB;  Service: Cardiovascular;  Laterality: N/A;  . TEE WITHOUT CARDIOVERSION N/A 08/15/2017   Procedure: TRANSESOPHAGEAL ECHOCARDIOGRAM (TEE);  Surgeon: Lewayne Bunting, MD;  Location: [facility] ENDOSCOPY;  Service: Cardiovascular;  Laterality: N/A;  . TUBAL LIGATION      Social History   Socioeconomic History  . Marital status: Married    Spouse name: Not on file  . Number of children: Not on file  . Years of education: Not on file  . Highest education level: Not on file  Occupational History  . Occupation: retired  Engineer, production  . Financial resource strain: Not on file  . Food insecurity:    Worry: Not on file    Inability: Not on file  . Transportation needs:    Medical: Not on file    Non-medical: Not on file  Tobacco Use  . Smoking status: Current Every Day Smoker    Packs/day: 1.50    Years: 51.00    Pack  years: 76.50    Types: Cigarettes  . Smokeless tobacco: Never Used  Substance and Sexual Activity  . Alcohol use: No  . Drug use: No  . Sexual activity: Not on file  Lifestyle  . Physical activity:    Days per week: Not on file    Minutes per session: Not on file  . Stress: Not on file  Relationships  . Social connections:    Talks on phone: Not on file    Gets together: Not on file    Attends religious service: Not on file    Active member of club or organization: Not on file    Attends meetings of clubs or organizations: Not on file    Relationship status: Not on file  . Intimate partner violence:    Fear of current or ex partner: Not on file    Emotionally abused: Not on file    Physically abused: Not on file     Forced sexual activity: Not on file  Other Topics Concern  . Not on file  Social History Narrative  . Not on file    Allergies  Allergen Reactions  . Biaxin [Clarithromycin] Other (See Comments)    Unknown  . Enalapril Itching and Other (See Comments)    Pt states she gets hyper  . Fentanyl Other (See Comments)    Unknown  . Hydrochlorothiazide Other (See Comments)    Unknown  . Lasix [Furosemide] Other (See Comments)    Unknown  . Levaquin [Levofloxacin In D5w] Other (See Comments)    Unknown  . Penicillins Other (See Comments)    Has patient had a PCN reaction causing immediate rash, facial/tongue/throat swelling, SOB or lightheadedness with hypotension: Yes Has patient had a PCN reaction causing severe rash involving mucus membranes or skin necrosis: No Has patient had a PCN reaction that required hospitalization: No Has patient had a PCN reaction occurring within the last 10 years: No If all of the above answers are "NO", then may proceed with Cephalosporin use.   Marland Kitchen Percocet [Oxycodone-Acetaminophen] Other (See Comments)    Unknown  . Sulfa Antibiotics Other (See Comments)    Unknown  . Zyban [Bupropion] Other (See Comments)    Unknown    Family History  Problem Relation Age of Onset  . Hypertension Other     Prior to Admission medications   Medication Sig Start Date End Date Taking? Authorizing Provider  acetaminophen (TYLENOL) 500 MG tablet Take 500 mg by mouth every 6 (six) hours as needed for mild pain.   Yes [provider]  aspirin 325 MG tablet Take 325 mg by mouth daily.   Yes [provider]  atorvastatin (LIPITOR) 80 MG tablet Take 1 tablet (80 mg total) by mouth daily at 6 PM. 08/15/17  Yes Darlin Drop, DO  cloNIDine (CATAPRES) 0.1 MG tablet Take 1 tablet (0.1 mg total) by mouth 2 (two) times daily. 08/15/17  Yes Dow Adolph N, DO  metoprolol tartrate (LOPRESSOR) 25 MG tablet Take 1 tablet (25 mg total) by mouth 2 (two) times daily.  08/15/17  Yes Darlin Drop, DO  nitroGLYCERIN (NITROSTAT) 0.4 MG SL tablet Place 1 tablet (0.4 mg total) under the tongue every 5 (five) minutes as needed. 11/17/16  Yes Arty Baumgartner, NP  zolpidem (AMBIEN) 10 MG tablet Take 10 mg by mouth at bedtime as needed for sleep.   Yes [provider]    Physical Exam: Vitals:   11/02/17 0530 11/02/17 0545 11/02/17  0615 11/02/17 0630  BP: (!) 183/73 (!) 150/95 (!) 162/63 (!) 159/59  Pulse: (!) 56 (!) 57 (!) 54 (!) 58  Resp: (!) 22 16 20 17   Temp:      TempSrc:      SpO2: 96% 96% 96% 95%     General:  Appears calm and comfortable and is NAD; appears older than stated age Eyes:  PERRL, EOMI, normal lids, iris ENT:  grossly normal hearing, lips & tongue, mmm Neck:  no LAD, masses or thyromegaly Cardiovascular:  RRR, no m/r/g. No LE edema.  Respiratory:   CTA bilaterally with scattered wheezes.  Normal respiratory effort.  Smells like tobacco. Abdomen:  soft, NT, ND, NABS Skin:  no rash or induration seen on limited exam Musculoskeletal: right foot is externally rotated; there is no obvious deformity of the right hip on brief exam Lower extremity:  No LE edema.  Limited foot exam with no ulcerations.  2+ distal pulses. Psychiatric:  grossly normal mood and affect, speech fluent and appropriate, mild slowing with occasional mild dysarthria, AOx3 Neurologic:  CN 2-12 grossly intact, moves all extremities in coordinated fashion other than R hip, sensation intact    Radiological Exams on Admission: Dg Chest 1 View  Result Date: 11/02/2017 CLINICAL DATA:  Status post fall, with shortness of breath. Initial encounter. EXAM: CHEST  1 VIEW COMPARISON:  Chest radiograph performed 08/12/2017 FINDINGS: The lungs are well-aerated. Minimal right basilar airspace opacity likely reflects atelectasis. There is no evidence of pleural effusion or pneumothorax. The cardiomediastinal silhouette is within normal limits. No acute osseous abnormalities  are seen. IMPRESSION: Minimal right basilar airspace opacity likely reflects atelectasis. Lungs otherwise clear. Electronically Signed   By: Roanna Raider M.D.   On: 11/02/2017 06:08   Ct Head Wo Contrast  Result Date: 11/02/2017 CLINICAL DATA:  77 y/o F; mechanical fall. Patient on blood thinners. EXAM: CT HEAD WITHOUT CONTRAST CT CERVICAL SPINE WITHOUT CONTRAST TECHNIQUE: Multidetector CT imaging of the head and cervical spine was performed following the standard protocol without intravenous contrast. Multiplanar CT image reconstructions of the cervical spine were also generated. COMPARISON:  08/13/2017 MRI head. FINDINGS: CT HEAD FINDINGS Brain: No evidence of acute infarction, hemorrhage, hydrocephalus, extra-axial collection or mass lesion/mass effect. Stable small chronic infarction in the right anterior frontal lobe. Small chronic lacunar infarcts are present in the right caudate head, right thalamus, and pons. Stable chronic microvascular ischemic changes and parenchymal volume loss of the brain given differences in technique. Vascular: Calcific atherosclerosis of carotid siphons. No hyperdense vessel identified. Skull: Normal. Negative for fracture or focal lesion. Sinuses/Orbits: No acute finding. Other: None. CT CERVICAL SPINE FINDINGS Alignment: Mild dextrocurvature of the cervical spine with apex at C4. C7-T1 grade 1 anterolisthesis. Skull base and vertebrae: No acute fracture. No primary bone lesion or focal pathologic process. Soft tissues and spinal canal: No prevertebral fluid or swelling. No visible canal hematoma. Disc levels: Cervical spondylosis with discogenic degenerative changes greatest at the C5-6 level and multilevel facet hypertrophy. Moderate C5-6 canal stenosis. Uncovertebral and facet hypertrophy results in bony foraminal stenosis bilateral at the C3 through C7 levels. Upper chest: Mild centrilobular emphysema of the lung apices. Other: 7 mm nodule in the right lobe of the  thyroid. Calcific atherosclerosis of the bilateral carotid bifurcations. IMPRESSION: 1. No acute intracranial abnormality or calvarial fracture. 2. No acute fracture or dislocation of the cervical spine. 3. Stable chronic microvascular ischemic changes and small chronic infarctions in the brain. 4. Cervical spondylosis greatest  at the C5-6 level where there is moderate bony canal stenosis. Electronically Signed   By: Mitzi Hansen M.D.   On: 11/02/2017 05:47   Ct Cervical Spine Wo Contrast  Result Date: 11/02/2017 CLINICAL DATA:  77 y/o F; mechanical fall. Patient on blood thinners. EXAM: CT HEAD WITHOUT CONTRAST CT CERVICAL SPINE WITHOUT CONTRAST TECHNIQUE: Multidetector CT imaging of the head and cervical spine was performed following the standard protocol without intravenous contrast. Multiplanar CT image reconstructions of the cervical spine were also generated. COMPARISON:  08/13/2017 MRI head. FINDINGS: CT HEAD FINDINGS Brain: No evidence of acute infarction, hemorrhage, hydrocephalus, extra-axial collection or mass lesion/mass effect. Stable small chronic infarction in the right anterior frontal lobe. Small chronic lacunar infarcts are present in the right caudate head, right thalamus, and pons. Stable chronic microvascular ischemic changes and parenchymal volume loss of the brain given differences in technique. Vascular: Calcific atherosclerosis of carotid siphons. No hyperdense vessel identified. Skull: Normal. Negative for fracture or focal lesion. Sinuses/Orbits: No acute finding. Other: None. CT CERVICAL SPINE FINDINGS Alignment: Mild dextrocurvature of the cervical spine with apex at C4. C7-T1 grade 1 anterolisthesis. Skull base and vertebrae: No acute fracture. No primary bone lesion or focal pathologic process. Soft tissues and spinal canal: No prevertebral fluid or swelling. No visible canal hematoma. Disc levels: Cervical spondylosis with discogenic degenerative changes greatest at  the C5-6 level and multilevel facet hypertrophy. Moderate C5-6 canal stenosis. Uncovertebral and facet hypertrophy results in bony foraminal stenosis bilateral at the C3 through C7 levels. Upper chest: Mild centrilobular emphysema of the lung apices. Other: 7 mm nodule in the right lobe of the thyroid. Calcific atherosclerosis of the bilateral carotid bifurcations. IMPRESSION: 1. No acute intracranial abnormality or calvarial fracture. 2. No acute fracture or dislocation of the cervical spine. 3. Stable chronic microvascular ischemic changes and small chronic infarctions in the brain. 4. Cervical spondylosis greatest at the C5-6 level where there is moderate bony canal stenosis. Electronically Signed   By: Mitzi Hansen M.D.   On: 11/02/2017 05:47   Dg Hip Unilat  With Pelvis 2-3 Views Right  Result Date: 11/02/2017 CLINICAL DATA:  77 y/o  F; fall tonight, right hip pain. EXAM: DG HIP (WITH OR WITHOUT PELVIS) 2-3V RIGHT COMPARISON:  None. FINDINGS: Acute mildly displaced and comminuted right femur intertrochanteric fracture with medial displacement of the lesser trochanter. No hip dislocation. No pelvic fracture or diastases identified. IMPRESSION: Acute mildly displaced and comminuted right femur intertrochanteric fracture with medial displacement of the lesser trochanter. Electronically Signed   By: Mitzi Hansen M.D.   On: 11/02/2017 06:02    EKG: Independently reviewed.  Sinus bradycardia with rate 61; nonspecific ST changes with no evidence of acute ischemia   Labs on Admission: I have personally reviewed the available labs and imaging studies at the time of the admission.  Pertinent labs:   Glucose 123 WBC 15.3  Assessment/Plan Principal Problem:   Closed right hip fracture, initial encounter (HCC) Active Problems:   Smoker   CAD (coronary artery disease)   Essential hypertension   Hyperlipidemia   COPD (chronic obstructive pulmonary disease) (HCC)   History of  stroke in prior 3 months   Hip fx -Mechanical fall resulting in hip fracture -Orthopedics consult - surgery today or tomorrow -NPO in anticipation of surgical repair; if tomorrow, will need a heart healthy diet today and NPO after MN -SCDs overnight, start Lovenox post-operatively (or as per ortho) -Pain control with Robxain, Vicodin, and Morphine prn -SW  consult for rehab placement -Will need PT consult post-operatively -Hip fracture order set utilized  H/o CAD/CVA Pre-operative clearance -Orthopedic/spinal surgery is associated with an intermediate (1-5%) cardiovascular risk for cardiac death and nonfatal MI -With her h/o CAD and CVA, her revised cardiac index gives a risk estimate of 6.6% -Because of this risk, she is recommended to have pre-operative EKG testing prior to surgery; this was done in the ER and was generally unremarkable -Her Detsky's Modified Cardiac Risk Index score is Class II, with a moderate cardiac risk -Iit is reasonable for her to go to the OR without additional evaluation  HTN -Continue Clonidine and Lopressor  HLD -Continue Lipitor  COPD with ongoing tobacco dependence -She does not appear to be using breathing treatments at home -Will order prn Duonebs -Tobacco Dependence: encourage cessation.  This was discussed with the patient and should be reviewed on an ongoing basis.   -She is precontemplative regarding quitting at this time. -Patch ordered at patient request.   DVT prophylaxis:  SCDs until approved for Lovenox by orthopedics Code Status:  DNR - confirmed with patient Family Communication: None present  Disposition Plan:  Home once clinically improved Consults called: Orthopedics; SW, Nutrition; will need PT post-operatively  Admission status: Admit - It is my clinical opinion that admission to INPATIENT is reasonable and necessary because of the expectation that this patient will require hospital care that crosses at least 2 midnights to treat  this condition based on the medical complexity of the problems presented.  Given the aforementioned information, the predictability of an adverse outcome is felt to be significant.   Jonah Blue MD [facility]  If note is complete, please contact covering daytime or nighttime physician. [email or web address] Password [facility]  11/02/2017, 8:33 AM

## 2017-11-02 NOTE — ED Notes (Addendum)
Patient reports not being able to use the purewick. This RN explained to patient that due to her hip fracture the only other option would be a bed pan. Patient refusing pain medicine at this time. Patient says to this RN "don't be so stupid, I have to pee" This is RN again explained that purewick was in place and the other option was a bedpan. Patient then says "Call my husband I'm leaving". Patient also requesting to be removed from bucks traction. Admitting md paged

## 2017-11-02 NOTE — ED Triage Notes (Signed)
BIB EMS from home after mechanical fall. Pt was walking when she tripped and fell landing on R hip and R arm. Obvious deformity noted to R hip, shortening and rotation. Pt takes thinners. Hypertensive en route.

## 2017-11-02 NOTE — ED Notes (Signed)
Or ready to take patient. Patient  Refused to go. Rationale explained to patient. Patient states "they can just do it tomorrow, are we clear"  And asking this RN and OR staff to leave room

## 2017-11-02 NOTE — Consult Note (Signed)
Reason for Consult:Right hip fx Referring Physician: Jarvis Morgan  Mackenzie Richards is an 77 y.o. female.  HPI: Mackenzie Richards was in her kitchen yesterday when she lost her balance and fell onto her right side. She had immediate hip pain and couldn't get up. She was brought to the ED where x-rays showed an intertroch hip fx and orthopedic surgery was consulted. She c/o localized pain in the area. She has had a problem with balance ever since she had a stroke. She usually ambulates with a walker but hasn't been using it for the past week because she's been feeling so good. She lives at home with her husband and two sons.  Past Medical History:  Diagnosis Date  . Arthritis   . Asthma   . Bowen's disease   . Breast cyst   . Complication of anesthesia    i AM SLOW TO WAKE UP   . Coronary artery disease   . CVA (cerebral vascular accident) (HCC) 08/2017  . Diverticulitis   . DVT (deep venous thrombosis) (HCC)   . Frequent UTI   . Hypertension   . Kidney cysts   . Myocardial infarction [facility]) 11/2016    Past Surgical History:  Procedure Laterality Date  . BILATERAL KNEE ARTHROSCOPY    . CORONARY STENT INTERVENTION N/A 11/15/2016   Procedure: Coronary Stent Intervention;  Surgeon: Lyn Records, MD;  Location: [facility] INVASIVE CV LAB;  Service: Cardiovascular;  Laterality: N/A;  Distal RCA  . EYE SURGERY    . LEFT HEART CATH AND CORONARY ANGIOGRAPHY N/A 11/15/2016   Procedure: Left Heart Cath and Coronary Angiography;  Surgeon: Lyn Records, MD;  Location: [facility] INVASIVE CV LAB;  Service: Cardiovascular;  Laterality: N/A;  . LOOP RECORDER INSERTION N/A 08/15/2017   Procedure: LOOP RECORDER INSERTION;  Surgeon: Duke Salvia, MD;  Location: [facility] INVASIVE CV LAB;  Service: Cardiovascular;  Laterality: N/A;  . TEE WITHOUT CARDIOVERSION N/A 08/15/2017   Procedure: TRANSESOPHAGEAL ECHOCARDIOGRAM (TEE);  Surgeon: Lewayne Bunting, MD;  Location: [facility] ENDOSCOPY;  Service: Cardiovascular;  Laterality: N/A;  . TUBAL  LIGATION      Family History  Problem Relation Age of Onset  . Hypertension Other     Social History:  reports that she has been smoking cigarettes.  She has a 76.50 pack-year smoking history. She has never used smokeless tobacco. She reports that she does not drink alcohol or use drugs.  Allergies:  Allergies  Allergen Reactions  . Biaxin [Clarithromycin] Other (See Comments)    Unknown  . Enalapril Itching and Other (See Comments)    Pt states she gets hyper  . Fentanyl Other (See Comments)    Unknown  . Hydrochlorothiazide Other (See Comments)    Unknown  . Lasix [Furosemide] Other (See Comments)    Unknown  . Levaquin [Levofloxacin In D5w] Other (See Comments)    Unknown  . Penicillins Other (See Comments)    Has patient had a PCN reaction causing immediate rash, facial/tongue/throat swelling, SOB or lightheadedness with hypotension: Yes Has patient had a PCN reaction causing severe rash involving mucus membranes or skin necrosis: No Has patient had a PCN reaction that required hospitalization: No Has patient had a PCN reaction occurring within the last 10 years: No If all of the above answers are "NO", then may proceed with Cephalosporin use.   Marland Kitchen Percocet [Oxycodone-Acetaminophen] Other (See Comments)    Unknown  . Sulfa Antibiotics Other (See Comments)    Unknown  . Zyban [Bupropion]  Other (See Comments)    Unknown    Medications: I have reviewed the patient's current medications.  Results for orders placed or performed during the hospital encounter of 11/02/17 (from the past 48 hour(s))  CBC with Differential/Platelet     Status: Abnormal   Collection Time: 11/02/17  5:00 AM  Result Value Ref Range   WBC 15.3 (H) 4.0 - 10.5 K/uL   RBC 4.19 3.87 - 5.11 MIL/uL   Hemoglobin 13.5 12.0 - 15.0 g/dL   HCT 16.1 09.6 - 04.5 %   MCV 101.0 (H) 78.0 - 100.0 fL   MCH 32.2 26.0 - 34.0 pg   MCHC 31.9 30.0 - 36.0 g/dL   RDW 40.9 81.1 - 91.4 %   Platelets 304 150 - 400  K/uL   Neutrophils Relative % 69 %   Neutro Abs 10.6 (H) 1.7 - 7.7 K/uL   Lymphocytes Relative 22 %   Lymphs Abs 3.3 0.7 - 4.0 K/uL   Monocytes Relative 5 %   Monocytes Absolute 0.8 0.1 - 1.0 K/uL   Eosinophils Relative 2 %   Eosinophils Absolute 0.3 0.0 - 0.7 K/uL   Basophils Relative 1 %   Basophils Absolute 0.1 0.0 - 0.1 K/uL   Immature Granulocytes 1 %   Abs Immature Granulocytes 0.1 0.0 - 0.1 K/uL    Comment: Performed at [facility], [address], [place], [place] [address]  I-Stat Chem 8, ED     Status: Abnormal   Collection Time: 11/02/17  5:08 AM  Result Value Ref Range   Sodium 143 135 - 145 mmol/L   Potassium 3.8 3.5 - 5.1 mmol/L   Chloride 106 101 - 111 mmol/L   BUN 14 6 - 20 mg/dL   Creatinine, Ser 6.21 0.44 - 1.00 mg/dL   Glucose, Bld 308 (H) 65 - 99 mg/dL   Calcium, Ion 6.57 (L) 1.15 - 1.40 mmol/L   TCO2 23 22 - 32 mmol/L   Hemoglobin 13.9 12.0 - 15.0 g/dL   HCT 84.6 96.2 - 95.2 %    Dg Chest 1 View  Result Date: 11/02/2017 CLINICAL DATA:  Status post fall, with shortness of breath. Initial encounter. EXAM: CHEST  1 VIEW COMPARISON:  Chest radiograph performed 08/12/2017 FINDINGS: The lungs are well-aerated. Minimal right basilar airspace opacity likely reflects atelectasis. There is no evidence of pleural effusion or pneumothorax. The cardiomediastinal silhouette is within normal limits. No acute osseous abnormalities are seen. IMPRESSION: Minimal right basilar airspace opacity likely reflects atelectasis. Lungs otherwise clear. Electronically Signed   By: Roanna Raider M.D.   On: 11/02/2017 06:08   Ct Head Wo Contrast  Result Date: 11/02/2017 CLINICAL DATA:  77 y/o F; mechanical fall. Patient on blood thinners. EXAM: CT HEAD WITHOUT CONTRAST CT CERVICAL SPINE WITHOUT CONTRAST TECHNIQUE: Multidetector CT imaging of the head and cervical spine was performed following the standard protocol without intravenous contrast. Multiplanar CT image reconstructions of  the cervical spine were also generated. COMPARISON:  08/13/2017 MRI head. FINDINGS: CT HEAD FINDINGS Brain: No evidence of acute infarction, hemorrhage, hydrocephalus, extra-axial collection or mass lesion/mass effect. Stable small chronic infarction in the right anterior frontal lobe. Small chronic lacunar infarcts are present in the right caudate head, right thalamus, and pons. Stable chronic microvascular ischemic changes and parenchymal volume loss of the brain given differences in technique. Vascular: Calcific atherosclerosis of carotid siphons. No hyperdense vessel identified. Skull: Normal. Negative for fracture or focal lesion. Sinuses/Orbits: No acute finding. Other: None. CT  CERVICAL SPINE FINDINGS Alignment: Mild dextrocurvature of the cervical spine with apex at C4. C7-T1 grade 1 anterolisthesis. Skull base and vertebrae: No acute fracture. No primary bone lesion or focal pathologic process. Soft tissues and spinal canal: No prevertebral fluid or swelling. No visible canal hematoma. Disc levels: Cervical spondylosis with discogenic degenerative changes greatest at the C5-6 level and multilevel facet hypertrophy. Moderate C5-6 canal stenosis. Uncovertebral and facet hypertrophy results in bony foraminal stenosis bilateral at the C3 through C7 levels. Upper chest: Mild centrilobular emphysema of the lung apices. Other: 7 mm nodule in the right lobe of the thyroid. Calcific atherosclerosis of the bilateral carotid bifurcations. IMPRESSION: 1. No acute intracranial abnormality or calvarial fracture. 2. No acute fracture or dislocation of the cervical spine. 3. Stable chronic microvascular ischemic changes and small chronic infarctions in the brain. 4. Cervical spondylosis greatest at the C5-6 level where there is moderate bony canal stenosis. Electronically Signed   By: Mitzi Hansen M.D.   On: 11/02/2017 05:47   Ct Cervical Spine Wo Contrast  Result Date: 11/02/2017 CLINICAL DATA:  77 y/o F;  mechanical fall. Patient on blood thinners. EXAM: CT HEAD WITHOUT CONTRAST CT CERVICAL SPINE WITHOUT CONTRAST TECHNIQUE: Multidetector CT imaging of the head and cervical spine was performed following the standard protocol without intravenous contrast. Multiplanar CT image reconstructions of the cervical spine were also generated. COMPARISON:  08/13/2017 MRI head. FINDINGS: CT HEAD FINDINGS Brain: No evidence of acute infarction, hemorrhage, hydrocephalus, extra-axial collection or mass lesion/mass effect. Stable small chronic infarction in the right anterior frontal lobe. Small chronic lacunar infarcts are present in the right caudate head, right thalamus, and pons. Stable chronic microvascular ischemic changes and parenchymal volume loss of the brain given differences in technique. Vascular: Calcific atherosclerosis of carotid siphons. No hyperdense vessel identified. Skull: Normal. Negative for fracture or focal lesion. Sinuses/Orbits: No acute finding. Other: None. CT CERVICAL SPINE FINDINGS Alignment: Mild dextrocurvature of the cervical spine with apex at C4. C7-T1 grade 1 anterolisthesis. Skull base and vertebrae: No acute fracture. No primary bone lesion or focal pathologic process. Soft tissues and spinal canal: No prevertebral fluid or swelling. No visible canal hematoma. Disc levels: Cervical spondylosis with discogenic degenerative changes greatest at the C5-6 level and multilevel facet hypertrophy. Moderate C5-6 canal stenosis. Uncovertebral and facet hypertrophy results in bony foraminal stenosis bilateral at the C3 through C7 levels. Upper chest: Mild centrilobular emphysema of the lung apices. Other: 7 mm nodule in the right lobe of the thyroid. Calcific atherosclerosis of the bilateral carotid bifurcations. IMPRESSION: 1. No acute intracranial abnormality or calvarial fracture. 2. No acute fracture or dislocation of the cervical spine. 3. Stable chronic microvascular ischemic changes and small  chronic infarctions in the brain. 4. Cervical spondylosis greatest at the C5-6 level where there is moderate bony canal stenosis. Electronically Signed   By: Mitzi Hansen M.D.   On: 11/02/2017 05:47   Dg Hip Unilat  With Pelvis 2-3 Views Right  Result Date: 11/02/2017 CLINICAL DATA:  77 y/o  F; fall tonight, right hip pain. EXAM: DG HIP (WITH OR WITHOUT PELVIS) 2-3V RIGHT COMPARISON:  None. FINDINGS: Acute mildly displaced and comminuted right femur intertrochanteric fracture with medial displacement of the lesser trochanter. No hip dislocation. No pelvic fracture or diastases identified. IMPRESSION: Acute mildly displaced and comminuted right femur intertrochanteric fracture with medial displacement of the lesser trochanter. Electronically Signed   By: Mitzi Hansen M.D.   On: 11/02/2017 06:02    Review of Systems  Constitutional: Negative for weight loss.  HENT: Negative for ear discharge, ear pain, hearing loss and tinnitus.   Eyes: Negative for blurred vision, double vision, photophobia and pain.  Respiratory: Negative for cough, sputum production and shortness of breath.   Cardiovascular: Negative for chest pain.  Gastrointestinal: Negative for abdominal pain, nausea and vomiting.  Genitourinary: Negative for dysuria, flank pain, frequency and urgency.  Musculoskeletal: Positive for joint pain (Right hip). Negative for back pain, falls, myalgias and neck pain.  Neurological: Negative for dizziness, tingling, sensory change, focal weakness, loss of consciousness and headaches.  Endo/Heme/Allergies: Does not bruise/bleed easily.  Psychiatric/Behavioral: Negative for depression, memory loss and substance abuse. The patient is not nervous/anxious.    Blood pressure (!) 159/59, pulse (!) 58, temperature 98.7 F (37.1 C), temperature source Oral, resp. rate 17, SpO2 95 %. Physical Exam  Constitutional: She appears well-developed and well-nourished. No distress.  HENT:   Head: Normocephalic and atraumatic.  Eyes: Conjunctivae are normal. Right eye exhibits no discharge. Left eye exhibits no discharge. No scleral icterus.  Neck: Normal range of motion.  Cardiovascular: Normal rate and regular rhythm.  Respiratory: Effort normal. No respiratory distress.  Musculoskeletal:  RLE No traumatic wounds, ecchymosis, or rash  TTP hip  No knee or ankle effusion  Knee stable to varus/ valgus and anterior/posterior stress  Sens DPN, SPN, TN intact  Motor EHL, ext, flex, evers 5/5  DP 2+, PT 1+, No significant edema  LLE No traumatic wounds, ecchymosis, or rash  Nontender  No knee or ankle effusion  Knee stable to varus/ valgus and anterior/posterior stress  Sens DPN, SPN, TN intact  Motor EHL, ext, flex, evers 5/5  DP 2+, PT 1+, No significant edema  Neurological: She is alert.  Skin: Skin is warm and dry. She is not diaphoretic.  Psychiatric: She has a normal mood and affect. Her behavior is normal.    Assessment/Plan: Fall Right hip fx -- For IMN by Dr. Jena Gauss either today or tomorrow, depending on OR schedule. Will place in [place] in the meantime.  Multiple medical problems including HTN, COPD, asthma, s/p CVA -- per primary service    Freeman Caldron, PA-C Orthopedic Surgery [phone] 11/02/2017, 8:31 AM

## 2017-11-02 NOTE — Progress Notes (Signed)
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  She has been refusing pain medication but complaining of pain.  Also now refusing surgery today.  I went to see the patient and to discuss it.  She appears to have capacity - she understands that she will likely not walk again if she opts to not have the surgery; that she will continue to have pain until the surgery takes place; and that they may not be able to fit her into the OR schedule as easily tomorrow.  Even so, she requests to NOT have surgery today.  She is unable to elucidate a reason for this but basically says her reason is irrelevant because it is what she is choosing.  As such, surgery will be deferred for an additional day.  She requests Tylenol for pain so this will be provided.  I have updated the nurse accordingly.  Georgana Curio, M.D.

## 2017-11-02 NOTE — ED Notes (Addendum)
Patient continues to curse at this RN. Patient requesting a catheter be put in. This RN explained that the doctor must give an order. 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

## 2017-11-02 NOTE — ED Notes (Signed)
Dr. Ophelia Charter paged to [facility] RN @ [phone].

## 2017-11-02 NOTE — ED Notes (Signed)
Admitting aware patient refusing all medicine

## 2017-11-02 NOTE — ED Notes (Signed)
Attempted to call report

## 2017-11-02 NOTE — Progress Notes (Signed)
Orthopedic Tech Progress Note Patient Details:  Mackenzie Richards March 10, 1941 [id number]  Ortho Devices Ortho Device/Splint Location: Trapeze bar Ortho Device/Splint Interventions: Application   Post Interventions Patient Tolerated: Well Instructions Provided: Care of device   Saul Fordyce 11/02/2017, 12:51 PM

## 2017-11-02 NOTE — ED Notes (Addendum)
I was assisting Orto Tech when traction Splint was being placed on RT. Leg, of patient and While I was holding leg up  [facility] was setting the Traction, The Patient Stated that she didn't want me to touch her any more after I Finish holding her leg up. She stated that I hurt her every time I touch her. I  informed patient of everything that, Was being done and I was being genital as I could be. I informed Nurse Hayley what was said.

## 2017-11-02 NOTE — ED Notes (Signed)
RN placed socks on pt and secured foley with leg strap.

## 2017-11-02 NOTE — Progress Notes (Signed)
Pt arrived to room 5N17 via bed accompanied by sister. All questions answered to satisfaction. Buck's Traction 10 lbs remains in place. Received report from Leach, RN in ED. See assessment. Will continue to monitor.

## 2017-11-02 NOTE — Care Management (Signed)
This is a no charge note  Pending admission per Dr. Nicanor Alcon  77 year old lady with past medical history of COPD, hypertension, hyperlipidemia, DVT, CAD, CVA, diverticulitis, who presents with mechanical fall and right hip fracture.  Patient is allergic to fentanyl and Percocet.  Due to COPD, need to be very careful in using narcotic per Dr. Nicanor Alcon. Ortho, Dr. Jena Gauss was consulted. Pt admitted to telemetry bed the same patient.   Lorretta Harp, MD  [facility] Pager [phone]  If 7PM-7AM, please contact night-coverage [email or web address] Password [facility] 11/02/2017, 6:33 AM

## 2017-11-02 NOTE — ED Notes (Signed)
Pt states she wants tylenol, nothing stronger. MD paged for tylenol

## 2017-11-03 ENCOUNTER — Encounter (HOSPITAL_COMMUNITY): Payer: Self-pay | Admitting: Certified Registered"

## 2017-11-03 ENCOUNTER — Encounter (HOSPITAL_COMMUNITY)
Admission: EM | Disposition: A | Discharge status: Home Health | Payer: Self-pay | Source: Home / Self Care | Attending: Internal Medicine

## 2017-11-03 ENCOUNTER — Inpatient Hospital Stay (HOSPITAL_COMMUNITY): Payer: Medicare HMO | Admitting: Certified Registered"

## 2017-11-03 ENCOUNTER — Inpatient Hospital Stay (HOSPITAL_COMMUNITY): Payer: Medicare HMO

## 2017-11-03 DIAGNOSIS — J449 Chronic obstructive pulmonary disease, unspecified: Secondary | ICD-10-CM

## 2017-11-03 DIAGNOSIS — S72141A Displaced intertrochanteric fracture of right femur, initial encounter for closed fracture: Principal | ICD-10-CM

## 2017-11-03 DIAGNOSIS — I2510 Atherosclerotic heart disease of native coronary artery without angina pectoris: Secondary | ICD-10-CM

## 2017-11-03 DIAGNOSIS — R296 Repeated falls: Secondary | ICD-10-CM | POA: Diagnosis present

## 2017-11-03 DIAGNOSIS — F17200 Nicotine dependence, unspecified, uncomplicated: Secondary | ICD-10-CM

## 2017-11-03 DIAGNOSIS — I10 Essential (primary) hypertension: Secondary | ICD-10-CM

## 2017-11-03 DIAGNOSIS — Z8673 Personal history of transient ischemic attack (TIA), and cerebral infarction without residual deficits: Secondary | ICD-10-CM

## 2017-11-03 DIAGNOSIS — E785 Hyperlipidemia, unspecified: Secondary | ICD-10-CM

## 2017-11-03 DIAGNOSIS — Z86718 Personal history of other venous thrombosis and embolism: Secondary | ICD-10-CM

## 2017-11-03 HISTORY — PX: FEMUR IM NAIL: SHX1597

## 2017-11-03 LAB — SURGICAL PCR SCREEN
MRSA, PCR: NEGATIVE
Staphylococcus aureus: NEGATIVE

## 2017-11-03 SURGERY — INSERTION, INTRAMEDULLARY ROD, FEMUR
Anesthesia: General | Laterality: Right

## 2017-11-03 MED ORDER — ONDANSETRON HCL 4 MG/2ML IJ SOLN
INTRAMUSCULAR | Status: DC | PRN
Start: 2017-11-03 — End: 2017-11-03
  Administered 2017-11-03: 4 mg via INTRAVENOUS

## 2017-11-03 MED ORDER — SUGAMMADEX SODIUM 200 MG/2ML IV SOLN
INTRAVENOUS | Status: AC
  Filled 2017-11-03: qty 2

## 2017-11-03 MED ORDER — DEXAMETHASONE SODIUM PHOSPHATE 10 MG/ML IJ SOLN
INTRAMUSCULAR | Status: AC
  Filled 2017-11-03: qty 1

## 2017-11-03 MED ORDER — CLINDAMYCIN PHOSPHATE 900 MG/50ML IV SOLN
900.0000 mg | Freq: Three times a day (TID) | INTRAVENOUS | Status: AC
Start: 2017-11-03 — End: 2017-11-04
  Administered 2017-11-03 – 2017-11-04 (×3): 900 mg via INTRAVENOUS
  Filled 2017-11-03 (×3): qty 50

## 2017-11-03 MED ORDER — IPRATROPIUM-ALBUTEROL 20-100 MCG/ACT IN AERS
INHALATION_SPRAY | RESPIRATORY_TRACT | Status: DC | PRN
  Administered 2017-11-03: 6 via RESPIRATORY_TRACT

## 2017-11-03 MED ORDER — LACTATED RINGERS IV SOLN
INTRAVENOUS | Status: DC
  Administered 2017-11-03: 09:00:00 via INTRAVENOUS

## 2017-11-03 MED ORDER — FENTANYL CITRATE (PF) 250 MCG/5ML IJ SOLN
INTRAMUSCULAR | Status: AC
  Filled 2017-11-03: qty 5

## 2017-11-03 MED ORDER — SUGAMMADEX SODIUM 200 MG/2ML IV SOLN
INTRAVENOUS | Status: DC | PRN
Start: 2017-11-03 — End: 2017-11-03
  Administered 2017-11-03: 150 mg via INTRAVENOUS

## 2017-11-03 MED ORDER — 0.9 % SODIUM CHLORIDE (POUR BTL) OPTIME
TOPICAL | Status: DC | PRN
  Administered 2017-11-03: 1000 mL

## 2017-11-03 MED ORDER — PROMETHAZINE HCL 25 MG/ML IJ SOLN: 6.2500 mg | INTRAMUSCULAR | Status: DC | PRN

## 2017-11-03 MED ORDER — ROCURONIUM BROMIDE 10 MG/ML (PF) SYRINGE
PREFILLED_SYRINGE | INTRAVENOUS | Status: AC
  Filled 2017-11-03: qty 5

## 2017-11-03 MED ORDER — HYDROMORPHONE HCL 2 MG/ML IJ SOLN: 0.2500 mg | INTRAMUSCULAR | Status: DC | PRN

## 2017-11-03 MED ORDER — ALBUTEROL SULFATE HFA 108 (90 BASE) MCG/ACT IN AERS
INHALATION_SPRAY | RESPIRATORY_TRACT | Status: AC
  Filled 2017-11-03: qty 6.7

## 2017-11-03 MED ORDER — LIDOCAINE 2% (20 MG/ML) 5 ML SYRINGE
INTRAMUSCULAR | Status: DC | PRN
  Administered 2017-11-03: 80 mg via INTRAVENOUS

## 2017-11-03 MED ORDER — FENTANYL CITRATE (PF) 250 MCG/5ML IJ SOLN
INTRAMUSCULAR | Status: DC | PRN
  Administered 2017-11-03: 75 ug via INTRAVENOUS

## 2017-11-03 MED ORDER — PHENYLEPHRINE 40 MCG/ML (10ML) SYRINGE FOR IV PUSH (FOR BLOOD PRESSURE SUPPORT)
PREFILLED_SYRINGE | INTRAVENOUS | Status: AC
  Filled 2017-11-03: qty 10

## 2017-11-03 MED ORDER — PHENYLEPHRINE HCL 10 MG/ML IJ SOLN
INTRAVENOUS | Status: DC | PRN
  Administered 2017-11-03: 30 ug/min via INTRAVENOUS

## 2017-11-03 MED ORDER — DEXAMETHASONE SODIUM PHOSPHATE 10 MG/ML IJ SOLN
INTRAMUSCULAR | Status: DC | PRN
  Administered 2017-11-03: 10 mg via INTRAVENOUS

## 2017-11-03 MED ORDER — ROCURONIUM BROMIDE 10 MG/ML (PF) SYRINGE
PREFILLED_SYRINGE | INTRAVENOUS | Status: DC | PRN
  Administered 2017-11-03: 50 mg via INTRAVENOUS

## 2017-11-03 MED ORDER — ONDANSETRON HCL 4 MG/2ML IJ SOLN
INTRAMUSCULAR | Status: AC
  Filled 2017-11-03: qty 2

## 2017-11-03 MED ORDER — LIDOCAINE 2% (20 MG/ML) 5 ML SYRINGE
INTRAMUSCULAR | Status: AC
  Filled 2017-11-03: qty 5

## 2017-11-03 MED ORDER — PROPOFOL 10 MG/ML IV BOLUS
INTRAVENOUS | Status: DC | PRN
  Administered 2017-11-03: 120 mg via INTRAVENOUS

## 2017-11-03 SURGICAL SUPPLY — 51 items
BANDAGE ACE 4X5 VEL STRL LF (GAUZE/BANDAGES/DRESSINGS) ×2 IMPLANT
BANDAGE ACE 6X5 VEL STRL LF (GAUZE/BANDAGES/DRESSINGS) IMPLANT
BANDAGE ELASTIC 6 VELCRO ST LF (GAUZE/BANDAGES/DRESSINGS) ×2 IMPLANT
BIT DRILL FLUTED FEMUR 4.2/3 (BIT) ×2 IMPLANT
BLADE HELICAL TFNA 95 STRL (Anchor) ×2 IMPLANT
BLADE SURG 10 STRL SS (BLADE) ×4 IMPLANT
BNDG COHESIVE 4X5 TAN STRL (GAUZE/BANDAGES/DRESSINGS) ×2 IMPLANT
BNDG COHESIVE 6X5 TAN STRL LF (GAUZE/BANDAGES/DRESSINGS) IMPLANT
BRUSH SCRUB SURG 4.25 DISP (MISCELLANEOUS) ×4 IMPLANT
CHLORAPREP W/TINT 26ML (MISCELLANEOUS) ×2 IMPLANT
COVER SURGICAL LIGHT HANDLE (MISCELLANEOUS) ×2 IMPLANT
DRAPE C-ARM 35X43 STRL (DRAPES) ×2 IMPLANT
DRAPE C-ARMOR (DRAPES) ×2 IMPLANT
DRAPE HALF SHEET 40X57 (DRAPES) ×4 IMPLANT
DRAPE IMP U-DRAPE 54X76 (DRAPES) ×4 IMPLANT
DRAPE INCISE IOBAN 66X45 STRL (DRAPES) ×2 IMPLANT
DRAPE ORTHO SPLIT 77X108 STRL (DRAPES) ×2
DRAPE SURG 17X23 STRL (DRAPES) ×2 IMPLANT
DRAPE SURG ORHT 6 SPLT 77X108 (DRAPES) ×2 IMPLANT
DRAPE U-SHAPE 47X51 STRL (DRAPES) ×2 IMPLANT
DRSG MEPILEX BORDER 4X4 (GAUZE/BANDAGES/DRESSINGS) ×2 IMPLANT
DRSG MEPILEX BORDER 4X8 (GAUZE/BANDAGES/DRESSINGS) ×2 IMPLANT
ELECT REM PT RETURN 9FT ADLT (ELECTROSURGICAL) ×2
ELECTRODE REM PT RTRN 9FT ADLT (ELECTROSURGICAL) ×1 IMPLANT
GLOVE BIO SURGEON STRL SZ7.5 (GLOVE) ×6 IMPLANT
GLOVE BIOGEL PI IND STRL 7.5 (GLOVE) ×1 IMPLANT
GLOVE BIOGEL PI INDICATOR 7.5 (GLOVE) ×1
GOWN STRL REUS W/ TWL LRG LVL3 (GOWN DISPOSABLE) ×3 IMPLANT
GOWN STRL REUS W/TWL LRG LVL3 (GOWN DISPOSABLE) ×3
GOWN STRL REUS W/TWL XL LVL3 (GOWN DISPOSABLE) ×2 IMPLANT
GUIDEWIRE 3.2X400 (WIRE) ×4 IMPLANT
KIT BASIN OR (CUSTOM PROCEDURE TRAY) ×2 IMPLANT
KIT TURNOVER KIT B (KITS) ×2 IMPLANT
MANIFOLD NEPTUNE II (INSTRUMENTS) ×2 IMPLANT
NAIL TROCH FIX 10X170 130 (Nail) ×2 IMPLANT
NS IRRIG 1000ML POUR BTL (IV SOLUTION) ×2 IMPLANT
PACK GENERAL/GYN (CUSTOM PROCEDURE TRAY) ×2 IMPLANT
PAD ARMBOARD 7.5X6 YLW CONV (MISCELLANEOUS) ×4 IMPLANT
SCREW LOCKING 5.0X32MM (Screw) ×2 IMPLANT
STAPLER VISISTAT 35W (STAPLE) ×2 IMPLANT
STOCKINETTE IMPERVIOUS LG (DRAPES) ×2 IMPLANT
SUT ETHILON 3 0 PS 1 (SUTURE) ×2 IMPLANT
SUT MNCRL AB 3-0 PS2 18 (SUTURE) ×2 IMPLANT
SUT VIC AB 0 CT1 27 (SUTURE)
SUT VIC AB 0 CT1 27XBRD ANBCTR (SUTURE) IMPLANT
SUT VIC AB 2-0 CT1 27 (SUTURE) ×2
SUT VIC AB 2-0 CT1 TAPERPNT 27 (SUTURE) ×2 IMPLANT
TOWEL OR 17X24 6PK STRL BLUE (TOWEL DISPOSABLE) ×2 IMPLANT
TOWEL OR 17X26 10 PK STRL BLUE (TOWEL DISPOSABLE) ×4 IMPLANT
UNDERPAD 30X30 (UNDERPADS AND DIAPERS) ×2 IMPLANT
WATER STERILE IRR 1000ML POUR (IV SOLUTION) ×2 IMPLANT

## 2017-11-03 NOTE — Progress Notes (Signed)
Patient removed buck's traction;Ortho tech came up;educated pt about the importance of the traction but pt refused.Ortho Doctor on call paged & made aware.Also pt refused MRSA PCR screening.Pt confused as of this writing.Bed alarm on;will continue to monitor.

## 2017-11-03 NOTE — Anesthesia Preprocedure Evaluation (Signed)
Anesthesia Evaluation  Patient identified by MRN, date of birth, ID band Patient awake    Reviewed: Allergy & Precautions, NPO status , Patient's Chart, lab work & pertinent test results, reviewed documented beta blocker date and time   Airway Mallampati: II  TM Distance: >3 FB Neck ROM: Full    Dental no notable dental hx.    Pulmonary neg pulmonary ROS, asthma , COPD, Current Smoker,    Pulmonary exam normal breath sounds clear to auscultation       Cardiovascular hypertension, Pt. on medications and Pt. on home beta blockers + CAD  negative cardio ROS Normal cardiovascular exam Rhythm:Regular Rate:Normal     Neuro/Psych CVA negative neurological ROS  negative psych ROS   GI/Hepatic negative GI ROS, Neg liver ROS,   Endo/Other  negative endocrine ROS  Renal/GU negative Renal ROS  negative genitourinary   Musculoskeletal negative musculoskeletal ROS (+)   Abdominal   Peds negative pediatric ROS (+)  Hematology negative hematology ROS (+)   Anesthesia Other Findings   Reproductive/Obstetrics negative OB ROS                             Anesthesia Physical Anesthesia Plan  ASA: III  Anesthesia Plan: General   Post-op Pain Management:    Induction: Intravenous  PONV Risk Score and Plan: 2 and Ondansetron, Midazolam and Treatment may vary due to age or medical condition  Airway Management Planned: Oral ETT  Additional Equipment:   Intra-op Plan:   Post-operative Plan: Extubation in OR  Informed Consent: I have reviewed the patients History and Physical, chart, labs and discussed the procedure including the risks, benefits and alternatives for the proposed anesthesia with the patient or authorized representative who has indicated his/her understanding and acceptance.   Dental advisory given  Plan Discussed with: CRNA  Anesthesia Plan Comments:         Anesthesia  Quick Evaluation

## 2017-11-03 NOTE — Progress Notes (Signed)
I , together with 2 other staff members finally had the patient back resting on the center of the bed;monitoring to continue.

## 2017-11-03 NOTE — Transfer of Care (Signed)
Immediate Anesthesia Transfer of Care Note  Patient: Mackenzie Richards  Procedure(s) Performed: INTRAMEDULLARY (IM) NAIL FEMORAL (Right )  Patient Location: PACU  Anesthesia Type:General  Level of Consciousness: drowsy  Airway & Oxygen Therapy: Patient Spontanous Breathing and Patient connected to nasal cannula oxygen  Post-op Assessment: Report given to RN and Post -op Vital signs reviewed and stable  Post vital signs: Reviewed and stable  Last Vitals:  Vitals Value Taken Time  BP 109/44 11/03/2017 10:52 AM  Temp    Pulse 54 11/03/2017 10:53 AM  Resp 19 11/03/2017 10:53 AM  SpO2 97 % 11/03/2017 10:53 AM  Vitals shown include unvalidated device data.  Last Pain:  Vitals:   11/03/17 0823  TempSrc: Oral  PainSc:          Complications: No apparent anesthesia complications

## 2017-11-03 NOTE — Op Note (Signed)
OrthopaedicSurgeryOperativeNote (WUJ:[id number]) Date of Surgery: 11/03/2017  Admit Date: 11/02/2017   Diagnoses: Pre-Op Diagnoses: Right intertrochanteric femur fracture  Post-Op Diagnosis: Same  Procedures: CPT 27245-Cephalomedullary nailing of right intertrochanteric femur fracture  Surgeons: Primary: Roby Lofts, MD   Location:[facility] OR ROOM 03   Anesthesia: General  Antibiotics:Clindamycin   Tourniquettime:None  EstimatedBloodLoss:50 mL   Complications:None  Specimens:None  Implants: Implant Name Type Inv. Item Serial No. Manufacturer Lot No. LRB No. Used Action  NAIL TROCH FIX [id number] 130 - [id number] Nail NAIL TROCH FIX [id number] 130  [employer] [id number] Right 1 Implanted  BLADE HELICAL TFNA STRL - [id number] Anchor BLADE HELICAL TFNA STRL  [employer] [id number] Right 1 Implanted  SCREW LOCKING 5.0X32MM - [id number] Screw SCREW LOCKING 5.0X32MM  [employer] [id number] Right 1 Implanted    IndicationsforSurgery: 77 year old female with a history of stroke, asthma, COPD and coronary artery disease that presents with a right intertrochanteric femur fracture from a ground-level fall.  She was previously ambulatory with assist device.  Due to their ambulatory status and displacement of fracture I felt that surgical fixation was warranted.  I discussed risks and benefits with the family.Risks discussed included bleeding requiring blood transfusion, bleeding causing a hematoma, infection, malunion, nonunion, damage to surrounding nerves and blood vessels, pain, hardware prominence or irritation, hardware failure, stiffness, post-traumatic arthritis, DVT/PE, compartment syndrome, and even death.  Patient and the family agreed to proceed with surgery and consent was obtained.  Operative Findings: Cephalo-medullary fixation of right intertrochanteric femur fracture using Synthes short TFN size 10 mm.  Procedure: The patient was identified in the  preoperative holding area. Consent was confirmed with the patient and their family and all questions were answered. The operative extremity was marked after confirmation with the patient and they were then brought back to the operating room by our anesthesia colleagues. The patient was placed under general anesthesia and then carefully transferred over to a Hana. The feet were secured into a traction boot and well padded. A post was placed in the groin and traction was pulled on the operative leg. The contralateral leg was positioned out of the way of fluoroscopy and secure . Fluoroscopic images were obtained and traction and manipulation was performed to reduce the fracture. Once adequate reduction was performed then the operative extremity was prepped and draped in sterile fashion. Preincision timeout was performed to verify the patient, the procedure and the extremity. Preoperative antibiotics were dosed.  A small incision was made proximal to the greater trochanter. A curved Mayo scissors was used to spread down to the greater trochanter in line with the abductor musculature. A threaded guidepin was positioned at an appropriate starting point on the AP and lateral views. It was advanced in the femur past the lesser trochanter. A entry reamer with soft tissue protector was then used to enter the canal. A radiographic ruler was used to judge the size of the canal of the femur and a 10mm short nail was placed into the canal and seated down to an appropriate position radiographically. The targeting arm for the helical blade was attached. A percutaneous incision was made for the guide for the helical blade. A threaded guidepin was placed into the femoral neck and head and fluoroscopy was used to confirm adequate placement with an acceptable tip-apex distance. A drill was used to perforate the lateral cortex and 95mm helical blade was inserted into the head/neck segment. The set screw was then tightened to set  rotation and backed off  to allow compression. The aiming arm was removed from the nail and position of the helical blade was confirmed with fluoroscopy. Using the targeting arm a distal interlock was placed bicortically in the shaft.  Final fluoroscopic images were obtained and the incisions were copiously irrigated. The skin was closed with 2-0 vicryl, 3-0 monocryl and sealed with dermabond. The incisions were dressing with Mepilex dressings. The patient was carefully transferred to the regular floor bed and was taken to PACU in stable condition.  Post Op Plan/Instructions: Patient will be weightbearing as tolerated to the right lower extremity.  She will receive Lovenox for DVT prophylaxis.  She will receive Clindamycin for surgical prophylaxis.  I was present and performed the entire surgery.  Truitt Merle, MD [facility]

## 2017-11-03 NOTE — Progress Notes (Signed)
PROGRESS NOTE  Mackenzie Richards ZOX:[id number] DOB: Sep 13, 1940 DOA: 11/02/2017 PCP: [name], NP  HPI  Mackenzie Richards is a 77 y.o. year old female with medical history significant for CVA (08/2017), CAD status post PCI and history of MI, hypertension, COPD with ongoing tobacco use, hyperlipidemia who presented on 11/02/2017 after ground-level mechanical fall without any preceding symptoms and was found to have right hip fracture.  Underwent intertrochanteric repair on 5/30.  Interval History Underwent right hip fracture repair this morning.   ROS:    Subjective No acute complaints currently.  Does report some "difficulty breathing".  No cough.  No fevers or chills.  Assessment/Plan: Principal Problem:   Closed comminuted intertrochanteric fracture of proximal end of right femur (HCC) Active Problems:   Smoker   Aborted myocardial infarction (HCC)   CAD (coronary artery disease)   History of DVT (deep vein thrombosis)   Essential hypertension   Hyperlipidemia   COPD (chronic obstructive pulmonary disease) (HCC)   History of stroke in prior 3 months   Unwitnessed fall   1. Closed right femur fracture, status post intertrochanteric repair on 5/30.  Related to mechanical fall with no prodromal symptoms.  PT evaluation for disposition.  Currently on low-dose aspirin due to recent CVA to serve as DVT prophylaxis. I think she should be on both aspirin and plavix for CVA/cardiac protection and DVT prophylaxis especially considering her DVT history; patient discontinued plavix. Will discuss risks/benefits again.  Ortho following, judicious pain control Norco every 6 as needed moderate pain, IV morphine severe pain every 2 hours as needed and PRN Robaxin, bowel regimen  2. Unwitnessed fall.  Patient denies any prodromal symptoms.  Implanted loop recorder in place.  Continue to monitor on telemetry.  No new neurologic deficits  3. Recent CVA.  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   Loop recorder placed at that time evaluate cryptogenic stroke.  Discharged at previous hospitalization on aspirin 325 mg and clopidogrel 75 mg per stroke and cardiac prevention but patient only taking aspirin. Stopped plavix on her own  4. Hypertension, uncontrolled, SBP is in 170s.  History of poor control.  Continue home clonidine, enalapril, Lopressor.  IV hydralazine.  Monitor closely.  5. CAD status post PCI to RCA (11/2016).  No current chest pain. Supposed to be on aspirin and plavix indefinitely but patient discontinued plavix several months ago per husband  6. COPD, stable.  On 2 L O2 per suspect assist for comfort.  No wheezing heard on exam.  Patient denies any cough though expresses dyspnea.  Wean oxygen, goal SPO2 greater than 88%.  Encourage incentive spirometry.  PRN duo nebs  7. Remote history of DVT.  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  Related to knee surgery at that time.  No recurrent episodes.  Family history, DVT and mom.  Hypercoagulable work-up negative on previous hospital stay.  8. Hyperlipidemia, continue home statin  9. Tobacco abuse, current smoker.  Smokes smoking cessation counseling provided on admission, patient precontemplation.  Nicotine patch in place  Code Status: DNR   Family Communication: Husband at bedside  Disposition Plan: PT eval, wean oxygen, pain control   Consultants:  Ortho  Procedures:  Right intertrochanteric femur fracture repair on 5/30  Antimicrobials:  IV Clinda  Cultures: None   Telemetry: None  DVT prophylaxis: Full dose aspirin   Objective: Vitals:   11/03/17 1154 11/03/17 1207 11/03/17 1215 11/03/17 1242  BP: (!) 151/79 (!) 164/60  Marland Kitchen)  175/49  Pulse: (!) 59 60 (!) 57 64  Resp: 17 (!) 22 17   Temp:  97.8 F (36.6 C)  97.9 F (36.6 C)  TempSrc:    Oral  SpO2: 96% 99% 96%     Intake/Output Summary (Last 24 hours) at 11/03/2017 1715 Last data filed at 11/03/2017 1154 Gross per 24 hour  Intake  800 ml  Output 151 ml  Net 649 ml   There were no vitals filed for this visit.  Exam:  Constitutional: Elderly female, in no acute distress, sleepy/somnolent but easily arousable Eyes: Eyes closed,  Cardiovascular: RRR no MRGs, with no peripheral edema Respiratory: Normal respiratory effort on 3 L nasal cannula, clear breath sounds without wheezes or rales Skin: Dressing in place on right hip.   Neurologic: Grossly no focal neuro deficit. Psychiatric:Appropriate affect, and mood. Mental status AAOx3  Data Reviewed: CBC: Recent Labs  Lab 11/02/17 0500 11/02/17 0508  WBC 15.3*  --   NEUTROABS 10.6*  --   HGB 13.5 13.9  HCT 42.3 41.0  MCV 101.0*  --   PLT 304  --    Basic Metabolic Panel: Recent Labs  Lab 11/02/17 0508  NA 143  K 3.8  CL 106  GLUCOSE 123*  BUN 14  CREATININE 0.90   GFR: CrCl cannot be calculated (Unknown ideal weight.). Liver Function Tests: No results for input(s): AST, ALT, ALKPHOS, BILITOT, PROT, ALBUMIN in the last 168 hours. No results for input(s): LIPASE, AMYLASE in the last 168 hours. No results for input(s): AMMONIA in the last 168 hours. Coagulation Profile: No results for input(s): INR, PROTIME in the last 168 hours. Cardiac Enzymes: No results for input(s): CKTOTAL, CKMB, CKMBINDEX, TROPONINI in the last 168 hours. BNP (last 3 results) No results for input(s): PROBNP in the last 8760 hours. HbA1C: No results for input(s): HGBA1C in the last 72 hours. CBG: No results for input(s): GLUCAP in the last 168 hours. Lipid Profile: No results for input(s): CHOL, HDL, LDLCALC, TRIG, CHOLHDL, LDLDIRECT in the last 72 hours. Thyroid Function Tests: No results for input(s): TSH, T4TOTAL, FREET4, T3FREE, THYROIDAB in the last 72 hours. Anemia Panel: No results for input(s): VITAMINB12, FOLATE, FERRITIN, TIBC, IRON, RETICCTPCT in the last 72 hours. Urine analysis:    Component Value Date/Time   COLORURINE YELLOW 08/12/2017 1822    APPEARANCEUR CLEAR 08/12/2017 1822   LABSPEC 1.012 08/12/2017 1822   PHURINE 5.0 08/12/2017 1822   GLUCOSEU NEGATIVE 08/12/2017 1822   HGBUR SMALL (A) 08/12/2017 1822   BILIRUBINUR NEGATIVE 08/12/2017 1822   KETONESUR NEGATIVE 08/12/2017 1822   PROTEINUR NEGATIVE 08/12/2017 1822   UROBILINOGEN 0.2 03/18/2015 1320   NITRITE NEGATIVE 08/12/2017 1822   LEUKOCYTESUR NEGATIVE 08/12/2017 1822   Sepsis Labs: @LABRCNTIP (procalcitonin:4,lacticidven:4)  ) Recent Results (from the past 240 hour(s))  Surgical pcr screen     Status: None   Collection Time: 11/03/17 12:20 PM  Result Value Ref Range Status   MRSA, PCR NEGATIVE NEGATIVE Final   Staphylococcus aureus NEGATIVE NEGATIVE Final    Comment: (NOTE) The Xpert SA Assay (FDA approved for NASAL specimens in patients 25 years of age and older), is one component of a comprehensive surveillance program. It is not intended to diagnose infection nor to guide or monitor treatment. Performed at [facility], [address], [place], [place] [address]       Studies: Dg C-arm 1-60 Min  Result Date: 11/03/2017 CLINICAL DATA:  ORIF EXAM: OPERATIVE RIGHT HIP (WITH PELVIS IF  PERFORMED) 4 VIEWS TECHNIQUE: Fluoroscopic spot image(s) were submitted for interpretation post-operatively. COMPARISON:  11/02/2017 FINDINGS: ORIF right hip. Anatomic alignment. Hardware intact. Fluoroscopic time 1 minutes 0 seconds. Four images obtained. IMPRESSION: ORIF right hip.  No acute abnormality. Electronically Signed   By: Maisie Fus  Register   On: 11/03/2017 10:58   Dg Hip Port Unilat With Pelvis 1v Right  Result Date: 11/03/2017 CLINICAL DATA:  Post Right IM Femoral Nail Insertion for Right Femoral Fracture. EXAM: DG HIP (WITH OR WITHOUT PELVIS) 1V PORT RIGHT COMPARISON:  11/03/2017 FINDINGS: Patient has undergone ORIF of RIGHT intertrochanteric fracture. Displaced lesser trochanter again noted. Alignment is near anatomic. No interval fractures or dislocation.  IMPRESSION: ORIF of RIGHT hip, without complicating features. Electronically Signed   By: Norva Pavlov M.D.   On: 11/03/2017 12:52   Dg Hip Operative Unilat W Or W/o Pelvis Right  Result Date: 11/03/2017 CLINICAL DATA:  ORIF EXAM: OPERATIVE RIGHT HIP (WITH PELVIS IF PERFORMED) 4 VIEWS TECHNIQUE: Fluoroscopic spot image(s) were submitted for interpretation post-operatively. COMPARISON:  11/02/2017 FINDINGS: ORIF right hip. Anatomic alignment. Hardware intact. Fluoroscopic time 1 minutes 0 seconds. Four images obtained. IMPRESSION: ORIF right hip.  No acute abnormality. Electronically Signed   By: Maisie Fus  Register   On: 11/03/2017 10:58    Scheduled Meds: . aspirin  325 mg Oral Daily  . atorvastatin  80 mg Oral q1800  . cloNIDine  0.1 mg Oral BID  . docusate sodium  100 mg Oral BID  . lidocaine  2 patch Transdermal Q24H  . metoprolol tartrate  25 mg Oral BID  . nicotine  21 mg Transdermal Daily    Continuous Infusions: . clindamycin (CLEOCIN) IV    . lactated ringers 10 mL/hr at 11/03/17 0852  . methocarbamol (ROBAXIN)  IV       LOS: 1 day     Laverna Peace, MD [facility] Pager [phone]  If 7PM-7AM, please contact night-coverage [email or web address] Password [email or web address] 11/03/2017, 5:15 PM

## 2017-11-03 NOTE — Progress Notes (Signed)
Patient sitting by the bedside;refuses hospital staff's help to assist her back to the center of the bed;fails to comply with safety measures;patient educated but refused care;bed alarm on;safety mats in placed;monitoring to continue.

## 2017-11-03 NOTE — Anesthesia Postprocedure Evaluation (Signed)
Anesthesia Post Note  Patient: Mackenzie Richards  Procedure(s) Performed: INTRAMEDULLARY (IM) NAIL FEMORAL (Right )     Patient location during evaluation: PACU Anesthesia Type: General Level of consciousness: awake and alert Pain management: pain level controlled Vital Signs Assessment: post-procedure vital signs reviewed and stable Respiratory status: spontaneous breathing, nonlabored ventilation and respiratory function stable Cardiovascular status: blood pressure returned to baseline and stable Postop Assessment: no apparent nausea or vomiting Anesthetic complications: no    Last Vitals:  Vitals:   11/03/17 1207 11/03/17 1215  BP: (!) 164/60   Pulse: 60 (!) 57  Resp: (!) 22 17  Temp: 36.6 C   SpO2: 99% 96%    Last Pain:  Vitals:   11/03/17 1215  TempSrc:   PainSc: 0-No pain                 Lowella Curb

## 2017-11-03 NOTE — Anesthesia Procedure Notes (Signed)
Procedure Name: Intubation Date/Time: 11/03/2017 9:48 AM Performed by: Elliot Dally, CRNA Pre-anesthesia Checklist: Patient identified, Emergency Drugs available, Suction available and Patient being monitored Patient Re-evaluated:Patient Re-evaluated prior to induction Oxygen Delivery Method: Circle System Utilized Preoxygenation: Pre-oxygenation with 100% oxygen Induction Type: IV induction Ventilation: Mask ventilation without difficulty Laryngoscope Size: Miller and 2 Grade View: Grade I Tube type: Oral Tube size: 7.0 mm Number of attempts: 1 Airway Equipment and Method: Stylet and Oral airway Placement Confirmation: ETT inserted through vocal cords under direct vision,  positive ETCO2 and breath sounds checked- equal and bilateral Secured at: 21 cm Tube secured with: Tape Dental Injury: Teeth and Oropharynx as per pre-operative assessment

## 2017-11-04 ENCOUNTER — Encounter (HOSPITAL_COMMUNITY): Payer: Self-pay | Admitting: Student

## 2017-11-04 LAB — CBC
HCT: 33.9 % — ABNORMAL LOW (ref 36.0–46.0)
Hemoglobin: 10.8 g/dL — ABNORMAL LOW (ref 12.0–15.0)
MCH: 32.4 pg (ref 26.0–34.0)
MCHC: 31.9 g/dL (ref 30.0–36.0)
MCV: 101.8 fL — ABNORMAL HIGH (ref 78.0–100.0)
PLATELETS: 226 10*3/uL (ref 150–400)
RBC: 3.33 MIL/uL — AB (ref 3.87–5.11)
RDW: 14.6 % (ref 11.5–15.5)
WBC: 13.7 10*3/uL — AB (ref 4.0–10.5)

## 2017-11-04 MED ORDER — ENOXAPARIN SODIUM 40 MG/0.4ML ~~LOC~~ SOLN
40.0000 mg | SUBCUTANEOUS | Status: DC
  Administered 2017-11-04 – 2017-11-05 (×2): 40 mg via SUBCUTANEOUS
  Filled 2017-11-04 (×2): qty 0.4

## 2017-11-04 NOTE — Care Management Important Message (Signed)
Important Message  Patient Details  Name: GRAESYN SERTICH MRN: [id number] Date of Birth: 1941/02/11   Medicare Important Message Given:  Yes    Woodruff Skirvin Stefan Church 11/04/2017, 3:55 PM

## 2017-11-04 NOTE — Care Management Note (Addendum)
Case Management Note  Patient Details  Name: Mackenzie Richards MRN: [id number] Date of Birth: 1940/11/17  Subjective/Objective:  77 yr old female admitted with right femur fracture, patient underwent a right femur IM Nailing.                Action/Plan: Case manager spoke with patient and her husband concerning discharge plan and DME.  Patient's husband is retired and he will be home with her. Choice for Home Health agency was offered, referral was called to Shaune Leeks, [facility] Liaison, patient asked that CM inform [facility] that she does not want the same therapist she had previously, someone with first name Dois Davenport. CM gave this request to Mr. Lovell Sheehan.  Expected Discharge Date:   11/05/17               Expected Discharge Plan:  Home w Home Health Services  In-House Referral:  NA  Discharge planning Services  CM Consult  Post Acute Care Choice:  Home Health Choice offered to:  Patient, Spouse  DME Arranged:  (has RW) DME Agency:     HH Arranged:  PT HH Agency:  [facility]  Status of Service:  Completed, signed off  If discussed at Long Length of Stay Meetings, dates discussed:    Additional Comments:  Durenda Guthrie, RN 11/04/2017, 2:58 PM

## 2017-11-04 NOTE — Evaluation (Signed)
Physical Therapy Evaluation Patient Details Name: Mackenzie Richards MRN: [id number] DOB: 1940-12-30 Today's Date: 11/04/2017   History of Present Illness  Pt is a 77 y/o female admitted secondary to sustaining a hip fracture after a mechanical fall at home. Pt is s/p Cephalomedullary nailing of right intertrochanteric femur fracture. PMH including but not limited to HTN, CVA and MI.    Clinical Impression  Pt presented supine in bed with HOB elevated, awake and willing to participate in therapy session. Prior to admission, pt reported that she was independent with all functional mobility and ADLs. Pt lives with her husband who is able to provide 24/7 supervision/assistance upon d/c. Pt currently requires min guard for bed mobility, min guard for transfers and min guard for ambulation with use of RW. Plan for stair training at next session if appropriate.  Pt would continue to benefit from skilled physical therapy services at this time while admitted and after d/c to address the below listed limitations in order to improve overall safety and independence with functional mobility.     Follow Up Recommendations Home health PT;Supervision/Assistance - 24 hour    Equipment Recommendations  None recommended by PT    Recommendations for Other Services       Precautions / Restrictions Precautions Precautions: Fall Restrictions Weight Bearing Restrictions: Yes RLE Weight Bearing: Weight bearing as tolerated      Mobility  Bed Mobility Overal bed mobility: Needs Assistance Bed Mobility: Supine to Sit     Supine to sit: Min guard     General bed mobility comments: increased time and effort, min guard for safety  Transfers Overall transfer level: Needs assistance Equipment used: Rolling walker (2 wheeled) Transfers: Sit to/from Stand Sit to Stand: Min guard         General transfer comment: increased time and effort, cueing for safe hand  placements  Ambulation/Gait Ambulation/Gait assistance: Min guard Ambulation Distance (Feet): 20 Feet Assistive device: Rolling walker (2 wheeled) Gait Pattern/deviations: Step-to pattern;Step-through pattern;Decreased step length - right;Decreased step length - left;Decreased stride length;Trunk flexed;Decreased weight shift to right Gait velocity: decreased Gait velocity interpretation: <1.31 ft/sec, indicative of household ambulator General Gait Details: pt with slow, steady, mildly antalgic gait pattern with flexed posture; min guard for safety and cueing for safety with RW  Stairs            Wheelchair Mobility    Modified Rankin (Stroke Patients Only)       Balance Overall balance assessment: Needs assistance Sitting-balance support: Feet supported Sitting balance-Leahy Scale: Good     Standing balance support: During functional activity;Bilateral upper extremity supported Standing balance-Leahy Scale: Poor                               Pertinent Vitals/Pain Pain Assessment: No/denies pain    Home Living Family/patient expects to be discharged to:: Private residence Living Arrangements: Spouse/significant other Available Help at Discharge: Family;Available 24 hours/day Type of Home: House Home Access: Stairs to enter Entrance Stairs-Rails: Doctor, general practice of Steps: 5 Home Layout: One level Home Equipment: Walker - 2 wheels      Prior Function Level of Independence: Independent               Hand Dominance        Extremity/Trunk Assessment   Upper Extremity Assessment Upper Extremity Assessment: Overall WFL for tasks assessed    Lower Extremity Assessment Lower Extremity Assessment: RLE deficits/detail  RLE Deficits / Details: pt with decreased strength and ROM limitations secondary to post-op weakness and pain       Communication   Communication: No difficulties  Cognition Arousal/Alertness:  Awake/alert Behavior During Therapy: WFL for tasks assessed/performed Overall Cognitive Status: Within Functional Limits for tasks assessed                                 General Comments: cognition not formally assessed but [facility] for general conversation      General Comments      Exercises     Assessment/Plan    PT Assessment Patient needs continued PT services  PT Problem List Decreased strength;Decreased range of motion;Decreased activity tolerance;Decreased balance;Decreased mobility;Decreased coordination;Decreased knowledge of use of DME;Decreased safety awareness;Decreased knowledge of precautions;Pain       PT Treatment Interventions DME instruction;Gait training;Stair training;Functional mobility training;Therapeutic activities;Therapeutic exercise;Balance training;Neuromuscular re-education;Patient/family education    PT Goals (Current goals can be found in the Care Plan section)  Acute Rehab PT Goals Patient Stated Goal: return home PT Goal Formulation: With patient/family Time For Goal Achievement: 11/18/17 Potential to Achieve Goals: Good    Frequency Min 3X/week   Barriers to discharge        Co-evaluation               AM-PAC PT "6 Clicks" Daily Activity  Outcome Measure Difficulty turning over in bed (including adjusting bedclothes, sheets and blankets)?: A Little Difficulty moving from lying on back to sitting on the side of the bed? : A Little Difficulty sitting down on and standing up from a chair with arms (e.g., wheelchair, bedside commode, etc,.)?: Unable Help needed moving to and from a bed to chair (including a wheelchair)?: A Little Help needed walking in hospital room?: A Little Help needed climbing 3-5 steps with a railing? : A Lot 6 Click Score: 15    End of Session Equipment Utilized During Treatment: Gait belt Activity Tolerance: Patient tolerated treatment well Patient left: in chair;with call bell/phone within  reach;with family/visitor present Nurse Communication: Mobility status PT Visit Diagnosis: Other abnormalities of gait and mobility (R26.89);Pain Pain - Right/Left: Right Pain - part of body: Hip    Time: 0938-1829 PT Time Calculation (min) (ACUTE ONLY): 30 min   Charges:   PT Evaluation $PT Eval Moderate Complexity: 1 Mod PT Treatments $Gait Training: 8-22 mins   PT G Codes:        [place], PT, DPT [phone]   Alessandra Bevels Zaylyn Bergdoll 11/04/2017, 2:15 PM

## 2017-11-04 NOTE — Progress Notes (Signed)
Orthopaedic Trauma Progress Note  S: Pain much improved, good spirits this AM. States she wants to go home and not to rehab  O:  Vitals:   11/03/17 1953 11/04/17 0422  BP: (!) 141/80 (!) 156/66  Pulse: 80 60  Resp:    Temp: 98.7 F (37.1 C) (!) 97.5 F (36.4 C)  SpO2: 94% 95%   Gen: NAD, AAOx3 RLE: Dressings clean, dry and intact. Swelling appropriate. Motor and sensory function intact  Imaging: Stable post op imaging  Labs:  CBC Pending this AM  A/P: 77 year old female s/p cephalomedullary fixation of right hip fracture  Weightbearing: WBAT RLE Insicional and dressing care: OK to remove dressings POD2 and leave open to air with dry gauze PRN Orthopedic device(s): None Showering: Okay to shower once dressings removed VTE prophylaxis: Lovenox 40mg  qd 30 days Pain control: Norco Follow - up plan: 2 weeks  Roby Lofts, MD [facility] [phone] (phone)

## 2017-11-04 NOTE — Progress Notes (Signed)
PROGRESS NOTE  Mackenzie Richards NGE:[id number] DOB: November 28, 1940 DOA: 11/02/2017 PCP: [name], NP  HPI  Mackenzie Richards is a 77 y.o. year old female with medical history significant for CVA (08/2017), CAD status post PCI and history of MI, hypertension, COPD with ongoing tobacco use, hyperlipidemia who presented on 11/02/2017 after ground-level mechanical fall without any preceding symptoms and was found to have right hip fracture.  Underwent intertrochanteric repair on 5/30.   Subjective No acute complaints currently.   Assessment/Plan: Principal Problem:   Closed comminuted intertrochanteric fracture of proximal end of right femur (HCC) Active Problems:   Smoker   Aborted myocardial infarction (HCC)   CAD (coronary artery disease)   History of DVT (deep vein thrombosis)   Essential hypertension   Hyperlipidemia   COPD (chronic obstructive pulmonary disease) (HCC)   History of stroke in prior 3 months   Unwitnessed fall   1. Closed right femur fracture, status post intertrochanteric repair on 5/30.  Related to mechanical fall with no prodromal symptoms.  PT recommens HHPT.  Currently on lovenox for DVT prophylaxis. I think she may need to be on aspirin and plavix instead because of recent CVA. Will need to discuss with orthopedic consultants.   pain control Norco every 6 as needed moderate pain, IV morphine severe pain every 2 hours as needed and PRN Robaxin, bowel regimen  2. Unwitnessed fall.  Patient denies any prodromal symptoms.  Implanted loop recorder in place.  Continue to monitor on telemetry.  No new neurologic deficits  3. Recent CVA.  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  Loop recorder placed at that time evaluate cryptogenic stroke.  Discharged at previous hospitalization on aspirin 325 mg and clopidogrel 75 mg per stroke and cardiac prevention but patient only taking aspirin. Stopped plavix on her own, states she's willing to resume, understands  risks/benefits  4. Hypertension, improved. SBPS 110s-150s.  History of poor control.  Continue home clonidine, enalapril, Lopressor.  IV hydralazine.  Monitor closely.  5. CAD status post PCI to RCA (11/2016).  No current chest pain. Supposed to be on aspirin and plavix indefinitely but patient discontinued plavix several months ago per husband  6. COPD, stable. On room air. Encourage incentive spirometry PRN duo nebs  7. Remote history of DVT.  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  Related to knee surgery at that time.  No recurrent episodes.  Family history, DVT and mom.  Hypercoagulable work-up negative on previous hospital stay.  8. Hyperlipidemia, continue home statin  9. Tobacco abuse, current smoker.  Smokes smoking cessation counseling provided on admission, patient precontemplation.  Nicotine patch in place  Code Status: DNR   Family Communication: Husband at bedside  Disposition Plan: HHPT, pain control, DVT ppx discussion with ortho, likely DC on tomorrow   Consultants:  Ortho  Procedures:  Right intertrochanteric femur fracture repair on 5/30  Antimicrobials:  IV Clinda 5/30  Cultures: None   Telemetry: None  DVT prophylaxis: Full dose aspirin and lovenox   Objective: Vitals:   11/04/17 1038 11/04/17 1228 11/04/17 1230 11/04/17 1450  BP:  (!) 199/90 (!) 157/75 118/74  Pulse:  73 63 65  Resp:      Temp:  98.4 F (36.9 C)    TempSrc:  Oral    SpO2:  97%  96%  Weight: 65.8 kg (145 lb)     Height: 5\' 2"  (1.575 m)       Intake/Output Summary (Last 24 hours)  at 11/04/2017 2115 Last data filed at 11/04/2017 1002 Gross per 24 hour  Intake 160 ml  Output 350 ml  Net -190 ml   Filed Weights   11/04/17 1038  Weight: 65.8 kg (145 lb)    Exam:  Constitutional: Elderly female, in no acute distress, pleasant Cardiovascular: RRR no MRGs, with no peripheral edema Respiratory: Normal respiratory effort on room air, clear breath sounds  without wheezes or rale MSK: Dressing in place on right hip.  Moving leg easily in bed Neurologic: Grossly no focal neuro deficit. Psychiatric:Appropriate affect, and mood. Mental status AAOx3  Data Reviewed: CBC: Recent Labs  Lab 11/02/17 0500 11/02/17 0508 11/04/17 0441  WBC 15.3*  --  13.7*  NEUTROABS 10.6*  --   --   HGB 13.5 13.9 10.8*  HCT 42.3 41.0 33.9*  MCV 101.0*  --  101.8*  PLT 304  --  226   Basic Metabolic Panel: Recent Labs  Lab 11/02/17 0508  NA 143  K 3.8  CL 106  GLUCOSE 123*  BUN 14  CREATININE 0.90   GFR: Estimated Creatinine Clearance: 47.3 mL/min (by C-G formula based on SCr of 0.9 mg/dL). Liver Function Tests: No results for input(s): AST, ALT, ALKPHOS, BILITOT, PROT, ALBUMIN in the last 168 hours. No results for input(s): LIPASE, AMYLASE in the last 168 hours. No results for input(s): AMMONIA in the last 168 hours. Coagulation Profile: No results for input(s): INR, PROTIME in the last 168 hours. Cardiac Enzymes: No results for input(s): CKTOTAL, CKMB, CKMBINDEX, TROPONINI in the last 168 hours. BNP (last 3 results) No results for input(s): PROBNP in the last 8760 hours. HbA1C: No results for input(s): HGBA1C in the last 72 hours. CBG: No results for input(s): GLUCAP in the last 168 hours. Lipid Profile: No results for input(s): CHOL, HDL, LDLCALC, TRIG, CHOLHDL, LDLDIRECT in the last 72 hours. Thyroid Function Tests: No results for input(s): TSH, T4TOTAL, FREET4, T3FREE, THYROIDAB in the last 72 hours. Anemia Panel: No results for input(s): VITAMINB12, FOLATE, FERRITIN, TIBC, IRON, RETICCTPCT in the last 72 hours. Urine analysis:    Component Value Date/Time   COLORURINE YELLOW 08/12/2017 1822   APPEARANCEUR CLEAR 08/12/2017 1822   LABSPEC 1.012 08/12/2017 1822   PHURINE 5.0 08/12/2017 1822   GLUCOSEU NEGATIVE 08/12/2017 1822   HGBUR SMALL (A) 08/12/2017 1822   BILIRUBINUR NEGATIVE 08/12/2017 1822   KETONESUR NEGATIVE 08/12/2017  1822   PROTEINUR NEGATIVE 08/12/2017 1822   UROBILINOGEN 0.2 03/18/2015 1320   NITRITE NEGATIVE 08/12/2017 1822   LEUKOCYTESUR NEGATIVE 08/12/2017 1822   Sepsis Labs: @LABRCNTIP (procalcitonin:4,lacticidven:4)  ) Recent Results (from the past 240 hour(s))  Surgical pcr screen     Status: None   Collection Time: 11/03/17 12:20 PM  Result Value Ref Range Status   MRSA, PCR NEGATIVE NEGATIVE Final   Staphylococcus aureus NEGATIVE NEGATIVE Final    Comment: (NOTE) The Xpert SA Assay (FDA approved for NASAL specimens in patients 23 years of age and older), is one component of a comprehensive surveillance program. It is not intended to diagnose infection nor to guide or monitor treatment. Performed at [facility], [address], [place], [place] [address]       Studies: No results found.  Scheduled Meds: . aspirin  325 mg Oral Daily  . atorvastatin  80 mg Oral q1800  . cloNIDine  0.1 mg Oral BID  . docusate sodium  100 mg Oral BID  . enoxaparin (LOVENOX) injection  40 mg Subcutaneous Q24H  .  lidocaine  2 patch Transdermal Q24H  . metoprolol tartrate  25 mg Oral BID  . nicotine  21 mg Transdermal Daily    Continuous Infusions: . lactated ringers 10 mL/hr at 11/03/17 0852  . methocarbamol (ROBAXIN)  IV       LOS: 2 days     Laverna Peace, MD [facility] Pager [phone]  If 7PM-7AM, please contact night-coverage [email or web address] Password [email or web address] 11/04/2017, 9:15 PM

## 2017-11-04 NOTE — Progress Notes (Signed)
Nutrition Consult/Brief Note  RD consulted via Hip Fracture Protocol.  Wt Readings from Last 15 Encounters:  11/04/17 145 lb (65.8 kg)  08/15/17 145 lb (65.8 kg)  11/16/16 136 lb 3.2 oz (61.8 kg)  11/13/16 130 lb (59 kg)  03/18/15 135 lb (61.2 kg)    Body mass index is 26.52 kg/m. Patient meets criteria for Overweight based on current BMI.   Current diet order is Heart Healthy/Carbohydrate Modified, patient is consuming approximately 100% of meals at this time. Labs and medications reviewed.   No nutrition interventions warranted at this time. If nutrition issues arise, please consult RD.   Maureen Chatters, RD, LDN Pager #: [phone] After-Hours Pager #: [phone]

## 2017-11-05 MED ORDER — POLYETHYLENE GLYCOL 3350 17 G PO PACK: 17.0000 g | PACK | Freq: Every day | ORAL | 0 refills | Status: AC | PRN

## 2017-11-05 MED ORDER — BISACODYL 5 MG PO TBEC: 5.0000 mg | DELAYED_RELEASE_TABLET | Freq: Every day | ORAL | 0 refills | Status: AC | PRN

## 2017-11-05 MED ORDER — CLOPIDOGREL BISULFATE 75 MG PO TABS
75.0000 mg | ORAL_TABLET | Freq: Every day | ORAL | Status: DC
  Administered 2017-11-05 – 2017-11-06 (×2): 75 mg via ORAL
  Filled 2017-11-05 (×2): qty 1

## 2017-11-05 MED ORDER — METHOCARBAMOL 500 MG PO TABS: 500.0000 mg | ORAL_TABLET | Freq: Four times a day (QID) | ORAL | 0 refills | Status: AC | PRN

## 2017-11-05 MED ORDER — CLOPIDOGREL BISULFATE 75 MG PO TABS: 75.0000 mg | ORAL_TABLET | Freq: Every day | ORAL | 0 refills | Status: DC

## 2017-11-05 NOTE — Progress Notes (Signed)
PROGRESS NOTE  Mackenzie Richards ZOX:[id number] DOB: January 01, 1941 DOA: 11/02/2017 PCP: [name], NP  HPI  Mackenzie Richards is a 77 y.o. year old female with medical history significant for CVA (08/2017), CAD status post PCI and history of MI, hypertension, COPD with ongoing tobacco use, hyperlipidemia who presented on 11/02/2017 after ground-level mechanical fall without any preceding symptoms and was found to have right hip fracture.  Underwent intertrochanteric repair on 5/30.   Subjective No acute complaints currently.    Assessment/Plan: Principal Problem:   Closed comminuted intertrochanteric fracture of proximal end of right femur (HCC) Active Problems:   Smoker   Aborted myocardial infarction (HCC)   CAD (coronary artery disease)   History of DVT (deep vein thrombosis)   Essential hypertension   Hyperlipidemia   COPD (chronic obstructive pulmonary disease) (HCC)   History of stroke in prior 3 months   Unwitnessed fall   1. Closed right femur fracture, status post intertrochanteric repair on 5/30.  Related to mechanical fall with no prodromal symptoms.  PT recommens HHPT. PT was unable to assess stair therapy today because of increased pain and confusion. Will hold on discharge until re-evaluated tomorrow. DVT prophylaxis with aspirin and plavix in light of recent CVA,discussed with ortho.  pain control Norco every 6 as needed moderate pain, IV morphine severe pain every 2 hours as needed and PRN Robaxin, bowel regimen  2. Unwitnessed fall.  Patient denies any prodromal symptoms.  Implanted loop recorder in place.  Continue to monitor on telemetry.  No new neurologic deficits  3. Recent CVA.  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  Loop recorder placed at that time evaluate cryptogenic stroke.  Discharged at previous hospitalization on aspirin 325 mg and clopidogrel 75 mg per stroke and cardiac prevention but patient only taking aspirin. Stopped plavix on her own, Have resumed  aspirin with plavix as patient is agreeable  4. Hypertension, improved. SBPS 110s-150s.  History of poor control.  Continue home clonidine, enalapril, Lopressor.  IV hydralazine.  Monitor closely.  5. CAD status post PCI to RCA (11/2016).  No current chest pain. Resumed DAPT  6. COPD, stable. On room air. Encourage incentive spirometry PRN duo nebs  7. Remote history of DVT.  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  Related to knee surgery at that time.  No recurrent episodes.  Family history, DVT and mom.  Hypercoagulable work-up negative on previous hospital stay.  8. Hyperlipidemia, continue home statin  9. Tobacco abuse, current smoker.  Smokes smoking cessation counseling provided on admission, patient precontemplation.  Nicotine patch in place  Code Status: DNR   Family Communication: Husband at bedside  Disposition Plan: HHPT, medically clear, pending PT eval ( stair training) for ultimate dispo home with PT or other  Consultants:  Ortho  Procedures:  Right intertrochanteric femur fracture repair on 5/30  Antimicrobials:  IV Clinda 5/30  Cultures: None   Telemetry: None  DVT prophylaxis: Full dose aspirin and lovenox   Objective: Vitals:   11/04/17 2159 11/05/17 0456 11/05/17 0849 11/05/17 1400  BP: (!) 144/65 (!) 165/73 (!) 140/58 138/68  Pulse: 60 (!) 55 67 69  Resp: 16 16  17   Temp: 98.2 F (36.8 C) 97.9 F (36.6 C) 98.2 F (36.8 C) 98.7 F (37.1 C)  TempSrc: Oral Oral Oral Oral  SpO2: 94% (!) 88% 95% 96%  Weight:      Height:        Intake/Output Summary (Last 24  hours) at 11/05/2017 1907 Last data filed at 11/05/2017 1700 Gross per 24 hour  Intake 1680 ml  Output 300 ml  Net 1380 ml   Filed Weights   11/04/17 1038  Weight: 65.8 kg (145 lb)    Exam:  Constitutional: Elderly female, in no acute distress, pleasant Cardiovascular: RRR no MRGs, with no peripheral edema Respiratory: Normal respiratory effort on room air, clear  breath sounds without wheezes or rale MSK: Dressing in place on right hip.  Moving leg easily in bed Neurologic: Grossly no focal neuro deficit. Psychiatric:Appropriate affect, and mood. Mental status AAOx3  Data Reviewed: CBC: Recent Labs  Lab 11/02/17 0500 11/02/17 0508 11/04/17 0441  WBC 15.3*  --  13.7*  NEUTROABS 10.6*  --   --   HGB 13.5 13.9 10.8*  HCT 42.3 41.0 33.9*  MCV 101.0*  --  101.8*  PLT 304  --  226   Basic Metabolic Panel: Recent Labs  Lab 11/02/17 0508  NA 143  K 3.8  CL 106  GLUCOSE 123*  BUN 14  CREATININE 0.90   GFR: Estimated Creatinine Clearance: 47.3 mL/min (by C-G formula based on SCr of 0.9 mg/dL). Liver Function Tests: No results for input(s): AST, ALT, ALKPHOS, BILITOT, PROT, ALBUMIN in the last 168 hours. No results for input(s): LIPASE, AMYLASE in the last 168 hours. No results for input(s): AMMONIA in the last 168 hours. Coagulation Profile: No results for input(s): INR, PROTIME in the last 168 hours. Cardiac Enzymes: No results for input(s): CKTOTAL, CKMB, CKMBINDEX, TROPONINI in the last 168 hours. BNP (last 3 results) No results for input(s): PROBNP in the last 8760 hours. HbA1C: No results for input(s): HGBA1C in the last 72 hours. CBG: No results for input(s): GLUCAP in the last 168 hours. Lipid Profile: No results for input(s): CHOL, HDL, LDLCALC, TRIG, CHOLHDL, LDLDIRECT in the last 72 hours. Thyroid Function Tests: No results for input(s): TSH, T4TOTAL, FREET4, T3FREE, THYROIDAB in the last 72 hours. Anemia Panel: No results for input(s): VITAMINB12, FOLATE, FERRITIN, TIBC, IRON, RETICCTPCT in the last 72 hours. Urine analysis:    Component Value Date/Time   COLORURINE YELLOW 08/12/2017 1822   APPEARANCEUR CLEAR 08/12/2017 1822   LABSPEC 1.012 08/12/2017 1822   PHURINE 5.0 08/12/2017 1822   GLUCOSEU NEGATIVE 08/12/2017 1822   HGBUR SMALL (A) 08/12/2017 1822   BILIRUBINUR NEGATIVE 08/12/2017 1822   KETONESUR NEGATIVE  08/12/2017 1822   PROTEINUR NEGATIVE 08/12/2017 1822   UROBILINOGEN 0.2 03/18/2015 1320   NITRITE NEGATIVE 08/12/2017 1822   LEUKOCYTESUR NEGATIVE 08/12/2017 1822   Sepsis Labs: @LABRCNTIP (procalcitonin:4,lacticidven:4)  ) Recent Results (from the past 240 hour(s))  Surgical pcr screen     Status: None   Collection Time: 11/03/17 12:20 PM  Result Value Ref Range Status   MRSA, PCR NEGATIVE NEGATIVE Final   Staphylococcus aureus NEGATIVE NEGATIVE Final    Comment: (NOTE) The Xpert SA Assay (FDA approved for NASAL specimens in patients 7 years of age and older), is one component of a comprehensive surveillance program. It is not intended to diagnose infection nor to guide or monitor treatment. Performed at [facility], [address] [place], [place] [address]       Studies: No results found.  Scheduled Meds: . aspirin  325 mg Oral Daily  . atorvastatin  80 mg Oral q1800  . cloNIDine  0.1 mg Oral BID  . clopidogrel  75 mg Oral Daily  . docusate sodium  100 mg Oral BID  .  lidocaine  2 patch Transdermal Q24H  . metoprolol tartrate  25 mg Oral BID  . nicotine  21 mg Transdermal Daily    Continuous Infusions: . lactated ringers 10 mL/hr at 11/03/17 0852  . methocarbamol (ROBAXIN)  IV       LOS: 3 days     Laverna Peace, MD [facility] Pager [phone]  If 7PM-7AM, please contact night-coverage [email or web address] Password [email or web address] 11/05/2017, 7:07 PM

## 2017-11-05 NOTE — Discharge Summary (Signed)
Discharge Summary  Mackenzie Richards YNW:[id number] DOB: 09-05-1940  PCP: [name], NP  Admit date: 11/02/2017 Discharge date: 11/06/17   Time spent: < 25 minutes  Admitted From: Home Disposition:  Home  Recommendations for Outpatient Follow-up:  1. Follow up with PCP in 1-2 weeks 2. Follow up with Dr. Derinda Richards) in 2 weeks for post op follow up 3. Resume plavix(pt self-discontinued) and aspirin for CVA and DVT prophylaxis 4. Provided short prescription of 15 tabs of norco for severe pain q6 H PRN for up to 3 days.   Home Health: HHPT Equipment/Devices:None Discharge Diagnoses:  Active Hospital Problems   Diagnosis Date Noted  . Closed comminuted intertrochanteric fracture of proximal end of right femur (HCC) 11/02/2017  . Unwitnessed fall 11/03/2017  . COPD (chronic obstructive pulmonary disease) (HCC) 11/02/2017  . History of stroke in prior 3 months 11/02/2017  . Essential hypertension   . Hyperlipidemia   . History of DVT (deep vein thrombosis)   . CAD (coronary artery disease) 08/12/2017  . Aborted myocardial infarction (HCC)   . Smoker 03/18/2015    Resolved Hospital Problems  No resolved problems to display.    Discharge Condition: Stable  CODE STATUS: DNR Diet recommendation: Heart Healthy  Vitals:   11/06/17 1015 11/06/17 1354  BP: 132/77 (!) 120/59  Pulse: 83 (!) 57  Resp:  16  Temp:  97.8 F (36.6 C)  SpO2: 98% 97%    History of present illness:  Mackenzie Richards is a 77 y.o. year old female with medical history significant for recent CVA (08/2017), CAD status post PCI and history of MI, hypertension, COPD with ongoing tobacco use, hyperlipidemia, remote history of DVT (1970s) who presented on 11/02/2017 after ground-level mechanical fall without any preceding symptoms and was found to have right hip fracture. Remaining hospital course addressed in problem based format below:   Hospital Course:  Principal Problem:   Closed comminuted  intertrochanteric fracture of proximal end of right femur (HCC) Active Problems:   Smoker   Aborted myocardial infarction (HCC)   CAD (coronary artery disease)   History of DVT (deep vein thrombosis)   Essential hypertension   Hyperlipidemia   COPD (chronic obstructive pulmonary disease) (HCC)   History of stroke in prior 3 months   Unwitnessed fall   1. Closed right femur fracture status post intertrochanteric repair on 5/30.  Occurred after mechanical fall with no prodromal symptoms.  Patient is excessively underwent operative repair with no complications.  VT prophylaxis was briefly Lovenox with full aspirin until transition to aspirin and Plavix as patient was prescribed DAPT on last hospitalization because of recent CVA.  Discussed with Ortho who agreed with DVT plan.  Patient provided short prescription of Norco every 6 hours as needed severe pain on discharge with bowel regimen.  2. Acute blood loss anemia, related to recent surgery. No active bleeding while here. Previous hgb baseline of 13-14. Down to 10.8 after surgery. Did not require any transfusions here, no signs of symptoms of bleeding. Advise repeat CBC with PCP on discharge. 3. Unwitnessed Fall. No prodromal symptoms. NO arrhythmias on telemetry. Did well with balance when working with PT. no new neurologic deficits on exam  4. Recent CVA.  Status post left pontine infarct on MRI on 3/19.  Patient had loop recorder placed during that admission to evaluate cryptogenic stroke.  Patient has only been taking aspirin 325.  After discussion with patient and husband minimal to taking both aspirin 325 and clopidogrel as initially  recommended by neurology.  This was started during admission and patient tolerated well without any signs or symptoms of bleeding     Antibiotics: IV Clinda 5/30  Microbiology: No  Consultations:  Ortho   Procedures/Studies: Right intertrochanteric femur fracture repair on 5/30 Dg Chest 1  View  Result Date: 11/02/2017 CLINICAL DATA:  Status post fall, with shortness of breath. Initial encounter. EXAM: CHEST  1 VIEW COMPARISON:  Chest radiograph performed 08/12/2017 FINDINGS: The lungs are well-aerated. Minimal right basilar airspace opacity likely reflects atelectasis. There is no evidence of pleural effusion or pneumothorax. The cardiomediastinal silhouette is within normal limits. No acute osseous abnormalities are seen. IMPRESSION: Minimal right basilar airspace opacity likely reflects atelectasis. Lungs otherwise clear. Electronically Signed   By: Mackenzie Richards M.D.   On: 11/02/2017 06:08   Ct Head Wo Contrast  Result Date: 11/02/2017 CLINICAL DATA:  77 y/o F; mechanical fall. Patient on blood thinners. EXAM: CT HEAD WITHOUT CONTRAST CT CERVICAL SPINE WITHOUT CONTRAST TECHNIQUE: Multidetector CT imaging of the head and cervical spine was performed following the standard protocol without intravenous contrast. Multiplanar CT image reconstructions of the cervical spine were also generated. COMPARISON:  08/13/2017 MRI head. FINDINGS: CT HEAD FINDINGS Brain: No evidence of acute infarction, hemorrhage, hydrocephalus, extra-axial collection or mass lesion/mass effect. Stable small chronic infarction in the right anterior frontal lobe. Small chronic lacunar infarcts are present in the right caudate head, right thalamus, and pons. Stable chronic microvascular ischemic changes and parenchymal volume loss of the brain given differences in technique. Vascular: Calcific atherosclerosis of carotid siphons. No hyperdense vessel identified. Skull: Normal. Negative for fracture or focal lesion. Sinuses/Orbits: No acute finding. Other: None. CT CERVICAL SPINE FINDINGS Alignment: Mild dextrocurvature of the cervical spine with apex at C4. C7-T1 grade 1 anterolisthesis. Skull base and vertebrae: No acute fracture. No primary bone lesion or focal pathologic process. Soft tissues and spinal canal: No  prevertebral fluid or swelling. No visible canal hematoma. Disc levels: Cervical spondylosis with discogenic degenerative changes greatest at the C5-6 level and multilevel facet hypertrophy. Moderate C5-6 canal stenosis. Uncovertebral and facet hypertrophy results in bony foraminal stenosis bilateral at the C3 through C7 levels. Upper chest: Mild centrilobular emphysema of the lung apices. Other: 7 mm nodule in the right lobe of the thyroid. Calcific atherosclerosis of the bilateral carotid bifurcations. IMPRESSION: 1. No acute intracranial abnormality or calvarial fracture. 2. No acute fracture or dislocation of the cervical spine. 3. Stable chronic microvascular ischemic changes and small chronic infarctions in the brain. 4. Cervical spondylosis greatest at the C5-6 level where there is moderate bony canal stenosis. Electronically Signed   By: Mitzi Hansen M.D.   On: 11/02/2017 05:47   Ct Cervical Spine Wo Contrast  Result Date: 11/02/2017 CLINICAL DATA:  77 y/o F; mechanical fall. Patient on blood thinners. EXAM: CT HEAD WITHOUT CONTRAST CT CERVICAL SPINE WITHOUT CONTRAST TECHNIQUE: Multidetector CT imaging of the head and cervical spine was performed following the standard protocol without intravenous contrast. Multiplanar CT image reconstructions of the cervical spine were also generated. COMPARISON:  08/13/2017 MRI head. FINDINGS: CT HEAD FINDINGS Brain: No evidence of acute infarction, hemorrhage, hydrocephalus, extra-axial collection or mass lesion/mass effect. Stable small chronic infarction in the right anterior frontal lobe. Small chronic lacunar infarcts are present in the right caudate head, right thalamus, and pons. Stable chronic microvascular ischemic changes and parenchymal volume loss of the brain given differences in technique. Vascular: Calcific atherosclerosis of carotid siphons. No hyperdense vessel identified. Skull:  Normal. Negative for fracture or focal lesion.  Sinuses/Orbits: No acute finding. Other: None. CT CERVICAL SPINE FINDINGS Alignment: Mild dextrocurvature of the cervical spine with apex at C4. C7-T1 grade 1 anterolisthesis. Skull base and vertebrae: No acute fracture. No primary bone lesion or focal pathologic process. Soft tissues and spinal canal: No prevertebral fluid or swelling. No visible canal hematoma. Disc levels: Cervical spondylosis with discogenic degenerative changes greatest at the C5-6 level and multilevel facet hypertrophy. Moderate C5-6 canal stenosis. Uncovertebral and facet hypertrophy results in bony foraminal stenosis bilateral at the C3 through C7 levels. Upper chest: Mild centrilobular emphysema of the lung apices. Other: 7 mm nodule in the right lobe of the thyroid. Calcific atherosclerosis of the bilateral carotid bifurcations. IMPRESSION: 1. No acute intracranial abnormality or calvarial fracture. 2. No acute fracture or dislocation of the cervical spine. 3. Stable chronic microvascular ischemic changes and small chronic infarctions in the brain. 4. Cervical spondylosis greatest at the C5-6 level where there is moderate bony canal stenosis. Electronically Signed   By: Mitzi Hansen M.D.   On: 11/02/2017 05:47   Dg C-arm 1-60 Min  Result Date: 11/03/2017 CLINICAL DATA:  ORIF EXAM: OPERATIVE RIGHT HIP (WITH PELVIS IF PERFORMED) 4 VIEWS TECHNIQUE: Fluoroscopic spot image(s) were submitted for interpretation post-operatively. COMPARISON:  11/02/2017 FINDINGS: ORIF right hip. Anatomic alignment. Hardware intact. Fluoroscopic time 1 minutes 0 seconds. Four images obtained. IMPRESSION: ORIF right hip.  No acute abnormality. Electronically Signed   By: Maisie Fus  Register   On: 11/03/2017 10:58   Dg Hip Port Unilat With Pelvis 1v Right  Result Date: 11/03/2017 CLINICAL DATA:  Post Right IM Femoral Nail Insertion for Right Femoral Fracture. EXAM: DG HIP (WITH OR WITHOUT PELVIS) 1V PORT RIGHT COMPARISON:  11/03/2017 FINDINGS:  Patient has undergone ORIF of RIGHT intertrochanteric fracture. Displaced lesser trochanter again noted. Alignment is near anatomic. No interval fractures or dislocation. IMPRESSION: ORIF of RIGHT hip, without complicating features. Electronically Signed   By: Norva Pavlov M.D.   On: 11/03/2017 12:52   Dg Hip Operative Unilat W Or W/o Pelvis Right  Result Date: 11/03/2017 CLINICAL DATA:  ORIF EXAM: OPERATIVE RIGHT HIP (WITH PELVIS IF PERFORMED) 4 VIEWS TECHNIQUE: Fluoroscopic spot image(s) were submitted for interpretation post-operatively. COMPARISON:  11/02/2017 FINDINGS: ORIF right hip. Anatomic alignment. Hardware intact. Fluoroscopic time 1 minutes 0 seconds. Four images obtained. IMPRESSION: ORIF right hip.  No acute abnormality. Electronically Signed   By: Maisie Fus  Register   On: 11/03/2017 10:58   Dg Hip Unilat  With Pelvis 2-3 Views Right  Result Date: 11/02/2017 CLINICAL DATA:  77 y/o  F; fall tonight, right hip pain. EXAM: DG HIP (WITH OR WITHOUT PELVIS) 2-3V RIGHT COMPARISON:  None. FINDINGS: Acute mildly displaced and comminuted right femur intertrochanteric fracture with medial displacement of the lesser trochanter. No hip dislocation. No pelvic fracture or diastases identified. IMPRESSION: Acute mildly displaced and comminuted right femur intertrochanteric fracture with medial displacement of the lesser trochanter. Electronically Signed   By: Mitzi Hansen M.D.   On: 11/02/2017 06:02     Discharge Exam: BP (!) 120/59 (BP Location: Right Arm)   Pulse (!) 57   Temp 97.8 F (36.6 C) (Oral)   Resp 16   Ht 5\' 2"  (1.575 m)   Wt 65.8 kg (145 lb)   SpO2 97%   BMI 26.52 kg/m   Constitutional: Elderly female, in no acute distress, pleasant Cardiovascular: RRR no MRGs, with no peripheral edema Respiratory: Normal respiratory effort on room air,  clear breath sounds without wheezes or rale MSK: Dressing in place on right hip without any signs of bleeding.  Moving leg easily  in bed Neurologic: Grossly no focal neuro deficit. Psychiatric:Appropriate affect, and mood. Mental status AAOx4   Discharge Instructions You were cared for by a hospitalist during your hospital stay. If you have any questions about your discharge medications or the care you received while you were in the hospital after you are discharged, you can call the unit and asked to speak with the hospitalist on call if the hospitalist that took care of you is not available. Once you are discharged, your primary care physician will handle any further medical issues. Please note that NO REFILLS for any discharge medications will be authorized once you are discharged, as it is imperative that you return to your primary care physician (or establish a relationship with a primary care physician if you do not have one) for your aftercare needs so that they can reassess your need for medications and monitor your lab values.  Discharge Instructions    Diet - low sodium heart healthy   Complete by:  As directed    Diet - low sodium heart healthy   Complete by:  As directed    Increase activity slowly   Complete by:  As directed    Increase activity slowly   Complete by:  As directed      Allergies as of 11/06/2017      Reactions   Fentanyl Other (See Comments)   "respiratory arrest"   Biaxin [clarithromycin] Other (See Comments)   Unknown   Enalapril Itching, Other (See Comments)   Pt states she gets hyper   Hydrochlorothiazide Other (See Comments)   Unknown   Lasix [furosemide] Other (See Comments)   Unknown   Levaquin [levofloxacin In D5w] Other (See Comments)   Unknown   Penicillins Other (See Comments)   Has patient had a PCN reaction causing immediate rash, facial/tongue/throat swelling, SOB or lightheadedness with hypotension: Yes Has patient had a PCN reaction causing severe rash involving mucus membranes or skin necrosis: No Has patient had a PCN reaction that required hospitalization:  No Has patient had a PCN reaction occurring within the last 10 years: No If all of the above answers are "NO", then may proceed with Cephalosporin use.   Percocet [oxycodone-acetaminophen] Other (See Comments)   Unknown   Sulfa Antibiotics Other (See Comments)   Unknown   Zyban [bupropion] Other (See Comments)   Unknown      Medication List    TAKE these medications   acetaminophen 500 MG tablet Commonly known as:  TYLENOL Take 500 mg by mouth every 6 (six) hours as needed for mild pain.   aspirin 325 MG tablet Take 325 mg by mouth daily.   atorvastatin 80 MG tablet Commonly known as:  LIPITOR Take 1 tablet (80 mg total) by mouth daily at 6 PM.   bisacodyl 5 MG EC tablet Commonly known as:  DULCOLAX Take 1 tablet (5 mg total) by mouth daily as needed for moderate constipation.   cloNIDine 0.1 MG tablet Commonly known as:  CATAPRES Take 1 tablet (0.1 mg total) by mouth 2 (two) times daily.   clopidogrel 75 MG tablet Commonly known as:  PLAVIX Take 1 tablet (75 mg total) by mouth daily.   HYDROcodone-acetaminophen 5-325 MG tablet Commonly known as:  NORCO/VICODIN Take 1 tablet by mouth every 6 (six) hours as needed for up to 3 days for severe pain.  methocarbamol 500 MG tablet Commonly known as:  ROBAXIN Take 1 tablet (500 mg total) by mouth every 6 (six) hours as needed for muscle spasms.   metoprolol tartrate 25 MG tablet Commonly known as:  LOPRESSOR Take 1 tablet (25 mg total) by mouth 2 (two) times daily.   nitroGLYCERIN 0.4 MG SL tablet Commonly known as:  NITROSTAT Place 1 tablet (0.4 mg total) under the tongue every 5 (five) minutes as needed.   polyethylene glycol packet Commonly known as:  MIRALAX / GLYCOLAX Take 17 g by mouth daily as needed for mild constipation.   zolpidem 10 MG tablet Commonly known as:  AMBIEN Take 10 mg by mouth at bedtime as needed for sleep.      Allergies  Allergen Reactions  . Fentanyl Other (See Comments)     "respiratory arrest"  . Biaxin [Clarithromycin] Other (See Comments)    Unknown  . Enalapril Itching and Other (See Comments)    Pt states she gets hyper  . Hydrochlorothiazide Other (See Comments)    Unknown  . Lasix [Furosemide] Other (See Comments)    Unknown  . Levaquin [Levofloxacin In D5w] Other (See Comments)    Unknown  . Penicillins Other (See Comments)    Has patient had a PCN reaction causing immediate rash, facial/tongue/throat swelling, SOB or lightheadedness with hypotension: Yes Has patient had a PCN reaction causing severe rash involving mucus membranes or skin necrosis: No Has patient had a PCN reaction that required hospitalization: No Has patient had a PCN reaction occurring within the last 10 years: No If all of the above answers are "NO", then may proceed with Cephalosporin use.   Marland Kitchen Percocet [Oxycodone-Acetaminophen] Other (See Comments)    Unknown  . Sulfa Antibiotics Other (See Comments)    Unknown  . Zyban [Bupropion] Other (See Comments)    Unknown   Follow-up Information    [facility].   Specialty:  Home Health Services Why:  A representative from [facility] will contact you to arrange start date and time for your therapy. Contact information: [address] [place] [address] [phone]        Roby Lofts, MD Follow up in 2 week(s).   Specialty:  Orthopedic Surgery Why:  post op followup  Contact information: [address] [place] [address] [phone]            The results of significant diagnostics from this hospitalization (including imaging, microbiology, ancillary and laboratory) are listed below for reference.    Significant Diagnostic Studies: Dg Chest 1 View  Result Date: 11/02/2017 CLINICAL DATA:  Status post fall, with shortness of breath. Initial encounter. EXAM: CHEST  1 VIEW COMPARISON:  Chest radiograph performed 08/12/2017 FINDINGS: The lungs are  well-aerated. Minimal right basilar airspace opacity likely reflects atelectasis. There is no evidence of pleural effusion or pneumothorax. The cardiomediastinal silhouette is within normal limits. No acute osseous abnormalities are seen. IMPRESSION: Minimal right basilar airspace opacity likely reflects atelectasis. Lungs otherwise clear. Electronically Signed   By: Mackenzie Richards M.D.   On: 11/02/2017 06:08   Ct Head Wo Contrast  Result Date: 11/02/2017 CLINICAL DATA:  77 y/o F; mechanical fall. Patient on blood thinners. EXAM: CT HEAD WITHOUT CONTRAST CT CERVICAL SPINE WITHOUT CONTRAST TECHNIQUE: Multidetector CT imaging of the head and cervical spine was performed following the standard protocol without intravenous contrast. Multiplanar CT image reconstructions of the cervical spine were also generated. COMPARISON:  08/13/2017 MRI head. FINDINGS: CT HEAD FINDINGS Brain: No evidence of acute infarction, hemorrhage, hydrocephalus, extra-axial collection or mass lesion/mass effect. Stable small chronic infarction in the right anterior frontal lobe. Small chronic lacunar infarcts are present in the right caudate head, right thalamus, and pons. Stable chronic microvascular ischemic changes and parenchymal volume loss of the brain given differences in technique. Vascular: Calcific atherosclerosis of carotid siphons. No hyperdense vessel identified. Skull: Normal. Negative for fracture or focal lesion. Sinuses/Orbits: No acute finding. Other: None. CT CERVICAL SPINE FINDINGS Alignment: Mild dextrocurvature of the cervical spine with apex at C4. C7-T1 grade 1 anterolisthesis. Skull base and vertebrae: No acute fracture. No primary bone lesion or focal pathologic process. Soft tissues and spinal canal: No prevertebral fluid or swelling. No visible canal hematoma. Disc levels: Cervical spondylosis with discogenic degenerative changes greatest at the C5-6 level and multilevel facet hypertrophy. Moderate C5-6 canal  stenosis. Uncovertebral and facet hypertrophy results in bony foraminal stenosis bilateral at the C3 through C7 levels. Upper chest: Mild centrilobular emphysema of the lung apices. Other: 7 mm nodule in the right lobe of the thyroid. Calcific atherosclerosis of the bilateral carotid bifurcations. IMPRESSION: 1. No acute intracranial abnormality or calvarial fracture. 2. No acute fracture or dislocation of the cervical spine. 3. Stable chronic microvascular ischemic changes and small chronic infarctions in the brain. 4. Cervical spondylosis greatest at the C5-6 level where there is moderate bony canal stenosis. Electronically Signed   By: Mitzi Hansen M.D.   On: 11/02/2017 05:47   Ct Cervical Spine Wo Contrast  Result Date: 11/02/2017 CLINICAL DATA:  77 y/o F; mechanical fall. Patient on blood thinners. EXAM: CT HEAD WITHOUT CONTRAST CT CERVICAL SPINE WITHOUT CONTRAST TECHNIQUE: Multidetector CT imaging of the head and cervical spine was performed following the standard protocol without intravenous contrast. Multiplanar CT image reconstructions of the cervical spine were also generated. COMPARISON:  08/13/2017 MRI head. FINDINGS: CT HEAD FINDINGS Brain: No evidence of acute infarction, hemorrhage, hydrocephalus, extra-axial collection or mass lesion/mass effect. Stable small chronic infarction in the right anterior frontal lobe. Small chronic lacunar infarcts are present in the right caudate head, right thalamus, and pons. Stable chronic microvascular ischemic changes and parenchymal volume loss of the brain given differences in technique. Vascular: Calcific atherosclerosis of carotid siphons. No hyperdense vessel identified. Skull: Normal. Negative for fracture or focal lesion. Sinuses/Orbits: No acute finding. Other: None. CT CERVICAL SPINE FINDINGS Alignment: Mild dextrocurvature of the cervical spine with apex at C4. C7-T1 grade 1 anterolisthesis. Skull base and vertebrae: No acute fracture. No  primary bone lesion or focal pathologic process. Soft tissues and spinal canal: No prevertebral fluid or swelling. No visible canal hematoma. Disc levels: Cervical spondylosis with discogenic degenerative changes greatest at the C5-6 level and multilevel facet hypertrophy. Moderate C5-6 canal stenosis. Uncovertebral and facet hypertrophy results in bony foraminal stenosis bilateral at the C3 through C7 levels. Upper chest: Mild centrilobular emphysema of the lung apices. Other: 7 mm nodule in the right lobe of the thyroid. Calcific atherosclerosis of the bilateral carotid bifurcations. IMPRESSION: 1. No acute intracranial abnormality or calvarial fracture. 2. No acute fracture or dislocation of the cervical spine. 3. Stable chronic microvascular ischemic changes and small chronic infarctions in the brain. 4. Cervical spondylosis greatest at the C5-6 level where there is moderate bony canal stenosis. Electronically Signed   By: Mitzi Hansen M.D.   On: 11/02/2017 05:47   Dg C-arm 1-60 Min  Result Date: 11/03/2017 CLINICAL DATA:  ORIF EXAM:  OPERATIVE RIGHT HIP (WITH PELVIS IF PERFORMED) 4 VIEWS TECHNIQUE: Fluoroscopic spot image(s) were submitted for interpretation post-operatively. COMPARISON:  11/02/2017 FINDINGS: ORIF right hip. Anatomic alignment. Hardware intact. Fluoroscopic time 1 minutes 0 seconds. Four images obtained. IMPRESSION: ORIF right hip.  No acute abnormality. Electronically Signed   By: Maisie Fus  Register   On: 11/03/2017 10:58   Dg Hip Port Unilat With Pelvis 1v Right  Result Date: 11/03/2017 CLINICAL DATA:  Post Right IM Femoral Nail Insertion for Right Femoral Fracture. EXAM: DG HIP (WITH OR WITHOUT PELVIS) 1V PORT RIGHT COMPARISON:  11/03/2017 FINDINGS: Patient has undergone ORIF of RIGHT intertrochanteric fracture. Displaced lesser trochanter again noted. Alignment is near anatomic. No interval fractures or dislocation. IMPRESSION: ORIF of RIGHT hip, without complicating  features. Electronically Signed   By: Norva Pavlov M.D.   On: 11/03/2017 12:52   Dg Hip Operative Unilat W Or W/o Pelvis Right  Result Date: 11/03/2017 CLINICAL DATA:  ORIF EXAM: OPERATIVE RIGHT HIP (WITH PELVIS IF PERFORMED) 4 VIEWS TECHNIQUE: Fluoroscopic spot image(s) were submitted for interpretation post-operatively. COMPARISON:  11/02/2017 FINDINGS: ORIF right hip. Anatomic alignment. Hardware intact. Fluoroscopic time 1 minutes 0 seconds. Four images obtained. IMPRESSION: ORIF right hip.  No acute abnormality. Electronically Signed   By: Maisie Fus  Register   On: 11/03/2017 10:58   Dg Hip Unilat  With Pelvis 2-3 Views Right  Result Date: 11/02/2017 CLINICAL DATA:  77 y/o  F; fall tonight, right hip pain. EXAM: DG HIP (WITH OR WITHOUT PELVIS) 2-3V RIGHT COMPARISON:  None. FINDINGS: Acute mildly displaced and comminuted right femur intertrochanteric fracture with medial displacement of the lesser trochanter. No hip dislocation. No pelvic fracture or diastases identified. IMPRESSION: Acute mildly displaced and comminuted right femur intertrochanteric fracture with medial displacement of the lesser trochanter. Electronically Signed   By: Mitzi Hansen M.D.   On: 11/02/2017 06:02    Microbiology: Recent Results (from the past 240 hour(s))  Surgical pcr screen     Status: None   Collection Time: 11/03/17 12:20 PM  Result Value Ref Range Status   MRSA, PCR NEGATIVE NEGATIVE Final   Staphylococcus aureus NEGATIVE NEGATIVE Final    Comment: (NOTE) The Xpert SA Assay (FDA approved for NASAL specimens in patients 52 years of age and older), is one component of a comprehensive surveillance program. It is not intended to diagnose infection nor to guide or monitor treatment. Performed at [facility], [address], [place], [place] [address]      Labs: Basic Metabolic Panel: Recent Labs  Lab 11/02/17 0508  NA 143  K 3.8  CL 106  GLUCOSE 123*  BUN 14  CREATININE 0.90    Liver Function Tests: No results for input(s): AST, ALT, ALKPHOS, BILITOT, PROT, ALBUMIN in the last 168 hours. No results for input(s): LIPASE, AMYLASE in the last 168 hours. No results for input(s): AMMONIA in the last 168 hours. CBC: Recent Labs  Lab 11/02/17 0500 11/02/17 0508 11/04/17 0441  WBC 15.3*  --  13.7*  NEUTROABS 10.6*  --   --   HGB 13.5 13.9 10.8*  HCT 42.3 41.0 33.9*  MCV 101.0*  --  101.8*  PLT 304  --  226   Cardiac Enzymes: No results for input(s): CKTOTAL, CKMB, CKMBINDEX, TROPONINI in the last 168 hours. BNP: BNP (last 3 results) No results for input(s): BNP in the last 8760 hours.  ProBNP (last 3 results) No results for input(s): PROBNP in the last 8760 hours.  CBG: No  results for input(s): GLUCAP in the last 168 hours.     Signed:  Laverna Peace, MD [facility] 11/06/2017, 2:19 PM

## 2017-11-05 NOTE — Progress Notes (Signed)
PT Cancellation Note  Patient Details Name: Mackenzie Richards MRN: [id number] DOB: 1940-07-13   Cancelled Treatment:    Reason Eval/Treat Not Completed: Fatigue/lethargy limiting ability to participate;Patient declined, no reason specified. Two attempts made to see pt this am. First attempt, pt unable to rouse fully to participate in therapy. On second attempt pt was up in chair but again difficult to rouse, only responding to husband present. On waking pt agitated and yelling at therapist to go away. Husband reports pt is in the early stages of dementia and has some bad days. Pt orientated to self only. Feel pt would benefit from additional day in hospital to complete stair training prior to d/c home. RN informed of pt status and need to additional PT prior to d/c.  Kallie Locks, PTA Pager [phone] Acute Rehab   Sheral Apley 11/05/2017, 11:37 AM

## 2017-11-06 ENCOUNTER — Other Ambulatory Visit: Payer: Self-pay

## 2017-11-06 DIAGNOSIS — S72001A Fracture of unspecified part of neck of right femur, initial encounter for closed fracture: Secondary | ICD-10-CM

## 2017-11-06 DIAGNOSIS — W19XXXA Unspecified fall, initial encounter: Secondary | ICD-10-CM

## 2017-11-06 MED ORDER — HYDROCODONE-ACETAMINOPHEN 5-325 MG PO TABS: 1.0000 | ORAL_TABLET | Freq: Four times a day (QID) | ORAL | 0 refills | Status: AC | PRN

## 2017-11-06 MED ORDER — HYDROCODONE-ACETAMINOPHEN 5-325 MG PO TABS: 1.0000 | ORAL_TABLET | Freq: Four times a day (QID) | ORAL | 0 refills | Status: DC | PRN

## 2017-11-06 MED ORDER — CLOPIDOGREL BISULFATE 75 MG PO TABS: 75.0000 mg | ORAL_TABLET | Freq: Every day | ORAL | 0 refills | Status: DC

## 2017-11-06 NOTE — Progress Notes (Signed)
Physical Therapy Treatment Patient Details Name: Mackenzie Richards MRN: [id number] DOB: Sep 29, 1940 Today's Date: 11/06/2017    History of Present Illness Pt is a 77 y/o female admitted secondary to sustaining a hip fracture after a mechanical fall at home. Pt is s/p Cephalomedullary nailing of right intertrochanteric femur fracture. PMH including but not limited to HTN, CVA and MI.    PT Comments    Pt demonstrates improved pain control and mobility.  Able to ascend/descend adequate steps to access home. Pt educated on simple strengthening for operative limb and on DVT prevention strategies related to increased mobility. Pt does demonstrate some shortterm memory lapses so spouse integral to educational interventions.  Spouse and pt both confident in ability to function safely at home with HHPT to follow for continued postsurgical rehabilitation.   Ready for d/c home today as planned.     Follow Up Recommendations  Home health PT;Supervision/Assistance - 24 hour     Equipment Recommendations  None recommended by PT    Recommendations for Other Services       Precautions / Restrictions Precautions Precautions: Fall Precaution Comments: up with assist/supervision for safety Restrictions RLE Weight Bearing: Weight bearing as tolerated    Mobility  Bed Mobility Overal bed mobility: Modified Independent Bed Mobility: Sit to Supine       Sit to supine: Modified independent (Device/Increase time)   General bed mobility comments: self-assist to RLE to surface of bed  Transfers Overall transfer level: Needs assistance Equipment used: Rolling walker (2 wheeled) Transfers: Sit to/from Stand Sit to Stand: Supervision         General transfer comment: close supervision in case of memory lapse but pt demonstrates safe hand placement with cues and able to stand without effort and sit with mild lack of control at terminus  Ambulation/Gait Ambulation/Gait assistance:  Supervision Ambulation Distance (Feet): 50 Feet Assistive device: Rolling walker (2 wheeled) Gait Pattern/deviations: Step-through pattern;Decreased stride length Gait velocity: decreased   General Gait Details: improved gait sequencing to maximize support and able to demonstrate functionally normalized pattern.  fatigues after short distances.  encouraged to walk at home 5-10 bouts for exercise and dvt prevention   Stairs Stairs: Yes Stairs assistance: Min guard Stair Management: Two rails;Forwards;Step to pattern Number of Stairs: 8 General stair comments: with demonstration and instruction pt able to return demonstrate and verbally describe UP DOWN pattern for maximal protection of surgical limb.  spouse instructed on appropriate guarding and both deny concerns about accessing home   Wheelchair Mobility    Modified Rankin (Stroke Patients Only)       Balance Overall balance assessment: History of Falls;Mild deficits observed, not formally tested Sitting-balance support: Feet supported;No upper extremity supported Sitting balance-Leahy Scale: Good     Standing balance support: Bilateral upper extremity supported;During functional activity Standing balance-Leahy Scale: Fair                              Cognition Arousal/Alertness: Awake/alert Behavior During Therapy: WFL for tasks assessed/performed Overall Cognitive Status: History of cognitive impairments - at baseline                                 General Comments: per spouse pt in early stages of dementia with need for some gentle safety reminders (don't stand without the RW or while w/c wheels aren't locked)  Exercises      General Comments        Pertinent Vitals/Pain Pain Assessment: 0-10 Pain Score: 2  Pain Location: RLE Pain Descriptors / Indicators: Aching;Sore Pain Intervention(s): Repositioned;Limited activity within patient's tolerance;Monitored during session     Home Living                      Prior Function            PT Goals (current goals can now be found in the care plan section) Acute Rehab PT Goals Patient Stated Goal: return home PT Goal Formulation: With patient/family Time For Goal Achievement: 11/18/17 Potential to Achieve Goals: Good Progress towards PT goals: Progressing toward goals    Frequency           PT Plan Current plan remains appropriate    Co-evaluation              AM-PAC PT "6 Clicks" Daily Activity  Outcome Measure  Difficulty turning over in bed (including adjusting bedclothes, sheets and blankets)?: A Little Difficulty moving from lying on back to sitting on the side of the bed? : A Little Difficulty sitting down on and standing up from a chair with arms (e.g., wheelchair, bedside commode, etc,.)?: A Little Help needed moving to and from a bed to chair (including a wheelchair)?: A Little Help needed walking in hospital room?: A Little Help needed climbing 3-5 steps with a railing? : A Little 6 Click Score: 18    End of Session Equipment Utilized During Treatment: Gait belt Activity Tolerance: Patient tolerated treatment well;Patient limited by fatigue Patient left: in bed;with call bell/phone within reach;with family/visitor present Nurse Communication: Mobility status PT Visit Diagnosis: History of falling (Z91.81);Pain;Other abnormalities of gait and mobility (R26.89) Pain - Right/Left: Right Pain - part of body: Hip;Leg     Time: 0915-1000 PT Time Calculation (min) (ACUTE ONLY): 45 min  Charges:  $Gait Training: 23-37 mins $Therapeutic Activity: 8-22 mins                    G Codes:       Narda Amber, PT, DPT, MS Board Certified Geriatric Clinical Specialist   Dennis Bast 11/06/2017, 10:11 AM

## 2017-11-06 NOTE — Progress Notes (Signed)
Patient discharge instructions and prescriptions have been given. Patient verbalized understanding. Patient escorted home with husband.

## 2017-11-21 ENCOUNTER — Ambulatory Visit (INDEPENDENT_AMBULATORY_CARE_PROVIDER_SITE_OTHER): Payer: Medicare HMO | Admitting: *Deleted

## 2017-11-21 DIAGNOSIS — I639 Cerebral infarction, unspecified: Secondary | ICD-10-CM

## 2017-11-21 NOTE — Progress Notes (Signed)
Carelink Summary Report / Loop Recorder 

## 2017-12-22 ENCOUNTER — Ambulatory Visit (INDEPENDENT_AMBULATORY_CARE_PROVIDER_SITE_OTHER): Payer: Medicare HMO | Admitting: *Deleted

## 2017-12-22 DIAGNOSIS — I639 Cerebral infarction, unspecified: Secondary | ICD-10-CM

## 2017-12-22 NOTE — Progress Notes (Signed)
Carelink Summary Report / Loop Recorder 

## 2017-12-28 LAB — CUP PACEART REMOTE DEVICE CHECK
Date Time Interrogation Session: 20190615163623
Implantable Pulse Generator Implant Date: 20190311

## 2018-01-08 ENCOUNTER — Emergency Department (HOSPITAL_COMMUNITY): Payer: Medicare HMO

## 2018-01-08 ENCOUNTER — Other Ambulatory Visit: Payer: Self-pay

## 2018-01-08 ENCOUNTER — Emergency Department (HOSPITAL_COMMUNITY)
Admission: EM | Admit: 2018-01-08 | Discharge: 2018-01-08 | Discharge status: Against Medical Advice | Payer: Medicare HMO | Attending: Emergency Medicine | Admitting: Emergency Medicine

## 2018-01-08 DIAGNOSIS — I10 Essential (primary) hypertension: Secondary | ICD-10-CM | POA: Diagnosis not present

## 2018-01-08 DIAGNOSIS — I639 Cerebral infarction, unspecified: Secondary | ICD-10-CM

## 2018-01-08 DIAGNOSIS — R4182 Altered mental status, unspecified: Secondary | ICD-10-CM | POA: Diagnosis present

## 2018-01-08 DIAGNOSIS — Z7982 Long term (current) use of aspirin: Secondary | ICD-10-CM | POA: Diagnosis not present

## 2018-01-08 DIAGNOSIS — Z79899 Other long term (current) drug therapy: Secondary | ICD-10-CM | POA: Diagnosis not present

## 2018-01-08 DIAGNOSIS — J449 Chronic obstructive pulmonary disease, unspecified: Secondary | ICD-10-CM | POA: Diagnosis not present

## 2018-01-08 DIAGNOSIS — F17210 Nicotine dependence, cigarettes, uncomplicated: Secondary | ICD-10-CM | POA: Diagnosis not present

## 2018-01-08 DIAGNOSIS — R55 Syncope and collapse: Secondary | ICD-10-CM | POA: Insufficient documentation

## 2018-01-08 DIAGNOSIS — R42 Dizziness and giddiness: Secondary | ICD-10-CM

## 2018-01-08 DIAGNOSIS — I2510 Atherosclerotic heart disease of native coronary artery without angina pectoris: Secondary | ICD-10-CM | POA: Insufficient documentation

## 2018-01-08 LAB — BASIC METABOLIC PANEL
ANION GAP: 11 (ref 5–15)
BUN: 18 mg/dL (ref 8–23)
CALCIUM: 9.4 mg/dL (ref 8.9–10.3)
CHLORIDE: 100 mmol/L (ref 98–111)
CO2: 26 mmol/L (ref 22–32)
Creatinine, Ser: 1.27 mg/dL — ABNORMAL HIGH (ref 0.44–1.00)
GFR calc Af Amer: 46 mL/min — ABNORMAL LOW (ref >60)
GFR calc non Af Amer: 40 mL/min — ABNORMAL LOW (ref >60)
GLUCOSE: 134 mg/dL — AB (ref 70–99)
Potassium: 3.8 mmol/L (ref 3.5–5.1)
Sodium: 137 mmol/L (ref 135–145)

## 2018-01-08 LAB — CBC WITH DIFFERENTIAL/PLATELET
Abs Immature Granulocytes: 0 10*3/uL (ref 0.0–0.1)
Basophils Absolute: 0 10*3/uL (ref 0.0–0.1)
Basophils Relative: 0 %
Eosinophils Absolute: 0.1 10*3/uL (ref 0.0–0.7)
Eosinophils Relative: 1 %
HEMATOCRIT: 49.3 % — AB (ref 36.0–46.0)
HEMOGLOBIN: 15.5 g/dL — AB (ref 12.0–15.0)
IMMATURE GRANULOCYTES: 0 %
LYMPHS ABS: 2.2 10*3/uL (ref 0.7–4.0)
LYMPHS PCT: 25 %
MCH: 32.4 pg (ref 26.0–34.0)
MCHC: 31.4 g/dL (ref 30.0–36.0)
MCV: 102.9 fL — AB (ref 78.0–100.0)
MONOS PCT: 7 %
Monocytes Absolute: 0.7 10*3/uL (ref 0.1–1.0)
Neutro Abs: 5.9 10*3/uL (ref 1.7–7.7)
Neutrophils Relative %: 67 %
Platelets: 295 10*3/uL (ref 150–400)
RBC: 4.79 MIL/uL (ref 3.87–5.11)
RDW: 13.9 % (ref 11.5–15.5)
WBC: 9 10*3/uL (ref 4.0–10.5)

## 2018-01-08 LAB — PROTIME-INR
INR: 0.91
Prothrombin Time: 12.2 seconds (ref 11.4–15.2)

## 2018-01-08 LAB — TROPONIN I: Troponin I: <0.03 ng/mL (ref <0.03)

## 2018-01-08 LAB — APTT: APTT: 31 s (ref 24–36)

## 2018-01-08 LAB — CBG MONITORING, ED: Glucose-Capillary: 133 mg/dL — ABNORMAL HIGH (ref 70–99)

## 2018-01-08 LAB — ETHANOL

## 2018-01-08 MED ORDER — LACTATED RINGERS IV BOLUS: 500.0000 mL | Freq: Once | INTRAVENOUS | Status: DC

## 2018-01-08 MED ORDER — ACETAMINOPHEN 500 MG PO TABS
500.0000 mg | ORAL_TABLET | Freq: Once | ORAL | Status: AC
  Administered 2018-01-08: 500 mg via ORAL
  Filled 2018-01-08: qty 1

## 2018-01-08 NOTE — Care Management (Signed)
Patient is active with [facility] will need HH resumption orders for [facility] RN, PT/ SPL. CM will update the EDP.

## 2018-01-08 NOTE — ED Notes (Signed)
Dr. Otelia Limes ( neurologist) at bedside evaluating pt.

## 2018-01-08 NOTE — Consult Note (Signed)
Neurology Consultation  Reason for Consult: dizziness, LOC, unsteady gait  Referring Physician: Dr. Lynelle Doctor  CC:  dizziness, LOC, unsteady gait   History is obtained from: patient, husband and chart review   HPI: Mackenzie Richards is a 77 y.o. female HTN, Stroke, DVT, heart disease, COPD, R hip repair with pins 3 months prior and ICD presents the the ER with c/o dizziness, LOC, unsteady gait. Per patient's husband, patient experienced trouble speaking this morning, followed by word finding difficulty, that later improved. Apparently, at approximately 1 pm patient experienced a loss of consciousness. Her husband states that he did not call EMS because he had hopes of her improving. Patient also displayed unsteady gait. He notified his children who encouraged patient to receive further evaluation. She denies HA, CP, SOB, current dizziness, diplopia, and states that her unsteady gait is secondary to recent surgery and pain that she has about 90% or the time.     ROS: A 14 point ROS was performed and is negative except as noted in the HPI.  Past Medical History:  Diagnosis Date  . Arthritis   . Asthma   . Bowen's disease   . Breast cyst   . Complication of anesthesia    i AM SLOW TO WAKE UP   . Coronary artery disease   . CVA (cerebral vascular accident) (HCC) 08/2017  . Diverticulitis   . DVT (deep venous thrombosis) (HCC)   . Frequent UTI   . Hypertension   . Kidney cysts   . Myocardial infarction (HCC) 11/2016   1-No significant post stroke disability and can perform usual duties with stroke symptoms   Family History  Problem Relation Age of Onset  . Hypertension Other     Social History:   reports that she has been smoking cigarettes.  She has a 76.50 pack-year smoking history. She has never used smokeless tobacco. She reports that she does not drink alcohol or use drugs.  Medications  Current Facility-Administered Medications:  .  acetaminophen (TYLENOL) tablet 500 mg, 500  mg, Oral, Once, Talitha Givens, MD .  lactated ringers bolus 500 mL, 500 mL, Intravenous, Once, Talitha Givens, MD  Current Outpatient Medications:  .  acetaminophen (TYLENOL) 500 MG tablet, Take 1,000 mg by mouth every 6 (six) hours as needed for headache (pain). , Disp: , Rfl:  .  aspirin 325 MG tablet, Take 325 mg by mouth daily., Disp: , Rfl:  .  atorvastatin (LIPITOR) 80 MG tablet, Take 1 tablet (80 mg total) by mouth daily at 6 PM., Disp: 30 tablet, Rfl: 0 .  cloNIDine (CATAPRES) 0.1 MG tablet, Take 1 tablet (0.1 mg total) by mouth 2 (two) times daily., Disp: 60 tablet, Rfl: 0 .  metoprolol tartrate (LOPRESSOR) 25 MG tablet, Take 1 tablet (25 mg total) by mouth 2 (two) times daily., Disp: 60 tablet, Rfl: 0 .  nitroGLYCERIN (NITROSTAT) 0.4 MG SL tablet, Place 1 tablet (0.4 mg total) under the tongue every 5 (five) minutes as needed. (Patient taking differently: Place 0.4 mg under the tongue every 5 (five) minutes as needed for chest pain. ), Disp: 25 tablet, Rfl: 2 .  zolpidem (AMBIEN) 10 MG tablet, Take 10 mg by mouth at bedtime. , Disp: , Rfl:  .  bisacodyl (DULCOLAX) 5 MG EC tablet, Take 1 tablet (5 mg total) by mouth daily as needed for moderate constipation. (Patient not taking: Reported on 01/08/2018), Disp: 30 tablet, Rfl: 0 .  clopidogrel (PLAVIX) 75 MG tablet, Take 1 tablet (75  mg total) by mouth daily. (Patient not taking: Reported on 01/08/2018), Disp: 90 tablet, Rfl: 0 .  methocarbamol (ROBAXIN) 500 MG tablet, Take 1 tablet (500 mg total) by mouth every 6 (six) hours as needed for muscle spasms. (Patient not taking: Reported on 01/08/2018), Disp: 30 tablet, Rfl: 0 .  polyethylene glycol (MIRALAX / GLYCOLAX) packet, Take 17 g by mouth daily as needed for mild constipation. (Patient not taking: Reported on 01/08/2018), Disp: 14 each, Rfl: 0   Exam: Current vital signs: BP 130/67   Pulse 75   Temp 97.7 F (36.5 C) (Oral)   Resp (!) 21   Ht 5\' 2"  (1.575 m)   Wt 63.5 kg (140 lb)   SpO2  95%   BMI 25.61 kg/m  Vital signs in last 24 hours: Temp:  [97.7 F (36.5 C)] 97.7 F (36.5 C) (08/04 1539) Pulse Rate:  [63-75] 75 (08/04 1830) Resp:  [16-26] 21 (08/04 1830) BP: (105-149)/(61-124) 130/67 (08/04 1830) SpO2:  [93 %-99 %] 95 % (08/04 1830) Weight:  [63.5 kg (140 lb)] 63.5 kg (140 lb) (08/04 1540)  GENERAL: Awake, alert in NAD, aggressive, and demanding at times  HEENT: - Normocephalic and atraumatic, dry mm, no LN++, no Thyromegally LUNGS - Clear to auscultation bilaterally with no wheezes CV - S1S2 RRR, no m/r/g, equal pulses bilaterally. ABDOMEN - Soft, nontender, nondistended with normoactive BS Ext: warm, well perfused, intact peripheral pulses,  edema  NEURO:  Mental Status: AA&Ox4. Naming, repetition, fluency and comprehension are intact with mild dysarthria noted. Cranial Nerves: PERRL 3 mm/brisk. Mild nystagmus on leftward gaze initially, resolved on repeat exam.  EOMI, visual fields full, no facial asymmetry, facial sensation intact, hearing intact, tongue/uvula/soft palate midline, normal sternocleidomastoid and trapezius muscle, nio nystagmus  strength. No evidence of tongue atrophy Motor: 5/5 in all ext's. Tone: is normal and bulk is normal Sensation- Intact to light touch bilaterally Coordination: FTN intact bilaterally. LUE with impaired coordination with rapid alternating movement  Gait- Antalgic on the left.    NIHSS 1   Labs I have reviewed labs in epic and the results pertinent to this consultation are:   CBC    Component Value Date/Time   WBC 9.0 01/08/2018 1714   RBC 4.79 01/08/2018 1714   HGB 15.5 (H) 01/08/2018 1714   HCT 49.3 (H) 01/08/2018 1714   PLT 295 01/08/2018 1714   MCV 102.9 (H) 01/08/2018 1714   MCH 32.4 01/08/2018 1714   MCHC 31.4 01/08/2018 1714   RDW 13.9 01/08/2018 1714   LYMPHSABS 2.2 01/08/2018 1714   MONOABS 0.7 01/08/2018 1714   EOSABS 0.1 01/08/2018 1714   BASOSABS 0.0 01/08/2018 1714    CMP     Component  Value Date/Time   NA 137 01/08/2018 1714   K 3.8 01/08/2018 1714   CL 100 01/08/2018 1714   CO2 26 01/08/2018 1714   GLUCOSE 134 (H) 01/08/2018 1714   BUN 18 01/08/2018 1714   CREATININE 1.27 (H) 01/08/2018 1714   CALCIUM 9.4 01/08/2018 1714   PROT 7.0 08/12/2017 1611   ALBUMIN 3.7 08/12/2017 1611   AST 17 08/12/2017 1611   ALT 10 (L) 08/12/2017 1611   ALKPHOS 78 08/12/2017 1611   BILITOT 0.7 08/12/2017 1611   GFRNONAA 40 (L) 01/08/2018 1714   GFRAA 46 (L) 01/08/2018 1714    Lipid Panel     Component Value Date/Time   CHOL 218 (H) 08/15/2017 0709   TRIG 120 08/15/2017 0709   HDL 34 (L) 08/15/2017  0709   CHOLHDL 6.4 08/15/2017 0709   VLDL 24 08/15/2017 0709   LDLCALC 160 (H) 08/15/2017 0709     Imaging I have reviewed the images obtained:  CT-scan of the brain 1.  No evidence for acute  abnormality. 2. Stable chronic infarcts of the RIGHT frontal lobe, pons, basal ganglia, and thalami. 3. Minimal sinus disease. 4. Atrophy and small vessel disease.  MRI examination of the brain - could not be obtained due to ICD  Assessment: Word finding difficulty followed by syncope and then ataxic gait.  1. No dysphasia on exam. Mild dysarthria noted.  2. Mild LUE ataxia with RAM, but not with FNF is of uncertain significance 3. Stroke is on the DDx 4. Right hip fx s/p orthopedic procedure 3 months ago. Now with significant RLE pain and antalgic gait.   Recommendations: 1. Stroke work up recommended to include repeat CT head in 3 days, CTA head/neck and TTE 2. PT/OT/Speech 3. Continue ASA, Plavix and statin 4. Patient expressed a desire to be discharged from the ED without further work up. She was encouraged to allow her admission for stroke work up and management   I have seen and examined the patient. I have amended the assessment and plan above.  Electronically signed: Dr. Caryl Pina

## 2018-01-08 NOTE — ED Notes (Signed)
Patient refused MRI recommended by neurologist.

## 2018-01-08 NOTE — ED Notes (Signed)
EDP explained plan of care to pt. But refused tosta and want to leave , EDP advised nurse that pt. Is leaving AMA .

## 2018-01-08 NOTE — ED Triage Notes (Addendum)
Patient arrived by EMS from home. Family reports that patient appears confused and agitated, husband told EMS that patient's speech "is not normal"   Patient is A&O X 4. She states, "my family called ambulance because I'm dizzy, I didn't eat right last night, my sugar is down, I staggered and they called" Also adds "I had a stroke few months ago and they was worried I'm having another one"

## 2018-01-08 NOTE — ED Provider Notes (Signed)
[facility] EMERGENCY DEPARTMENT Provider Note   CSN: [id number] Arrival date & time: 01/08/18  1524     History   Chief Complaint Chief Complaint  Patient presents with  . Altered Mental Status    HPI  HPI  Mackenzie Richards is a 77 year old female with a history of CAD and CVA who presents via EMS due to concern for stroke. Her husband reports that at approximately 6 AM this morning, he noticed that her speech seemed slurred.  At approximately 1 PM, he says that she became unresponsive and would not arouse or respond to him for approximately 1 hour.  During this time, he called EMS.  Currently, she says that she feels "dizzy" and as though the room is spinning around her.  She reports having chronic right-sided weakness after her prior stroke.  She does not think that she takes any blood thinners or anticoagulants.  She denies recent fever, chills, nausea, vomiting, and falls/trauma.  She denies chest pain and shortness of breath.  Past Medical History:  Diagnosis Date  . Arthritis   . Asthma   . Bowen's disease   . Breast cyst   . Complication of anesthesia    i AM SLOW TO WAKE UP   . Coronary artery disease   . CVA (cerebral vascular accident) (HCC) 08/2017  . Diverticulitis   . DVT (deep venous thrombosis) (HCC)   . Frequent UTI   . Hypertension   . Kidney cysts   . Myocardial infarction [facility]) 11/2016    Patient Active Problem List   Diagnosis Date Noted  . Unwitnessed fall 11/03/2017  . Closed comminuted intertrochanteric fracture of proximal end of right femur (HCC) 11/02/2017  . COPD (chronic obstructive pulmonary disease) (HCC) 11/02/2017  . History of stroke in prior 3 months 11/02/2017  . History of DVT (deep vein thrombosis)   . Essential hypertension   . Hyperlipidemia   . Acute CVA (cerebrovascular accident) (HCC) 08/12/2017  . CAD (coronary artery disease) 08/12/2017  . Acute ST elevation myocardial infarction (STEMI) of inferior wall  (HCC)   . Aborted myocardial infarction (HCC)   . 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 History of coronary artery stent placement (2009/2010) 03/18/2015  . Smoker 03/18/2015  . Aphasia     Past Surgical History:  Procedure Laterality Date  . BILATERAL KNEE ARTHROSCOPY    . CORONARY STENT INTERVENTION N/A 11/15/2016   Procedure: Coronary Stent Intervention;  Surgeon: Lyn Records, MD;  Location: [facility] INVASIVE CV LAB;  Service: Cardiovascular;  Laterality: N/A;  Distal RCA  . EYE SURGERY    . FEMUR IM NAIL Right 11/03/2017   Procedure: INTRAMEDULLARY (IM) NAIL FEMORAL;  Surgeon: Roby Lofts, MD;  Location: [facility] OR;  Service: Orthopedics;  Laterality: Right;  . LEFT HEART CATH AND CORONARY ANGIOGRAPHY N/A 11/15/2016   Procedure: Left Heart Cath and Coronary Angiography;  Surgeon: Lyn Records, MD;  Location: [facility] INVASIVE CV LAB;  Service: Cardiovascular;  Laterality: N/A;  . LOOP RECORDER INSERTION N/A 08/15/2017   Procedure: LOOP RECORDER INSERTION;  Surgeon: Duke Salvia, MD;  Location: [facility] INVASIVE CV LAB;  Service: Cardiovascular;  Laterality: N/A;  . TEE WITHOUT CARDIOVERSION N/A 08/15/2017   Procedure: TRANSESOPHAGEAL ECHOCARDIOGRAM (TEE);  Surgeon: Lewayne Bunting, MD;  Location: [facility] ENDOSCOPY;  Service: Cardiovascular;  Laterality: N/A;  . TUBAL LIGATION       OB History   None      Home Medications  Prior to Admission medications   Medication Sig Start Date End Date Taking? Authorizing Provider  acetaminophen (TYLENOL) 500 MG tablet Take 1,000 mg by mouth every 6 (six) hours as needed for headache (pain).    Yes [provider]  aspirin 325 MG tablet Take 325 mg by mouth daily.   Yes [provider]  atorvastatin (LIPITOR) 80 MG tablet Take 1 tablet (80 mg total) by mouth daily at 6 PM. 08/15/17  Yes Darlin Drop, DO  cloNIDine (CATAPRES) 0.1 MG tablet Take 1 tablet (0.1 mg total) by mouth 2 (two) times daily. 08/15/17  Yes Dow Adolph N, DO    metoprolol tartrate (LOPRESSOR) 25 MG tablet Take 1 tablet (25 mg total) by mouth 2 (two) times daily. 08/15/17  Yes Darlin Drop, DO  nitroGLYCERIN (NITROSTAT) 0.4 MG SL tablet Place 1 tablet (0.4 mg total) under the tongue every 5 (five) minutes as needed. Patient taking differently: Place 0.4 mg under the tongue every 5 (five) minutes as needed for chest pain.  11/17/16  Yes Laverda Page B, NP  zolpidem (AMBIEN) 10 MG tablet Take 10 mg by mouth at bedtime.    Yes [provider]  bisacodyl (DULCOLAX) 5 MG EC tablet Take 1 tablet (5 mg total) by mouth daily as needed for moderate constipation. Patient not taking: Reported on 01/08/2018 11/05/17   Roberto Scales D, MD  clopidogrel (PLAVIX) 75 MG tablet Take 1 tablet (75 mg total) by mouth daily. Patient not taking: Reported on 01/08/2018 11/06/17   Laverna Peace, MD  methocarbamol (ROBAXIN) 500 MG tablet Take 1 tablet (500 mg total) by mouth every 6 (six) hours as needed for muscle spasms. Patient not taking: Reported on 01/08/2018 11/05/17   Roberto Scales D, MD  polyethylene glycol (MIRALAX / GLYCOLAX) packet Take 17 g by mouth daily as needed for mild constipation. Patient not taking: Reported on 01/08/2018 11/05/17   Laverna Peace, MD    Family History Family History  Problem Relation Age of Onset  . Hypertension Other     Social History Social History   Tobacco Use  . Smoking status: Current Every Day Smoker    Packs/day: 1.50    Years: 51.00    Pack years: 76.50    Types: Cigarettes  . Smokeless tobacco: Never Used  Substance Use Topics  . Alcohol use: No  . Drug use: No     Allergies   Fentanyl; Enalapril; Hydrochlorothiazide; Lasix [furosemide]; Levaquin [levofloxacin in d5w]; Percocet [oxycodone-acetaminophen]; Sulfa antibiotics; Zyban [bupropion]; Biaxin [clarithromycin]; and Penicillins   Review of Systems Review of Systems Review of Systems   Constitutional  Negative for fever  Negative for chills   HENT  Negative for ear pain  Negative for sore throat  Negative for difficultly swallowing  Eyes  Negative for eye pain  Negative for visual disturbance  Respiratory  Negative for shortness of breath  Negative for cough  CV  Negative for chest pain  Negative for leg swelling  Abdomen  Negative for abdominal pain  Negative for nausea  Negative for vomiting  MSK  Negative for extremity pain  Negative for back pain  Skin  Negative for rash  Negative for wound  Neuro  +for syncope  +for difficultly speaking  Psych  Negative for confusion   The remainder of the ROS was reviewed and negative except as documented above.      Physical Exam Updated Vital Signs BP 130/67   Pulse 75   Temp  97.7 F (36.5 C) (Oral)   Resp (!) 21   Ht 5\' 2"  (1.575 m)   Wt 63.5 kg (140 lb)   SpO2 95%   BMI 25.61 kg/m   Physical Exam Physical Exam Constitutional  Nursing notes reviewed  Vital signs reviewed  HEENT  No obvious trauma  Supple without meningismus, mass, or overt JVD  EOMI  No scleral icterus or injection  Respiratory  Effort normal  CTAB  No respiratory distress  CV  Normal rate  No obvious murmurs  No pitting edema  Abdomen  Soft  Non-tender  Non-distended  No peritonitis  MSK  Atraumatic  No obvious deformity  ROM appropriate  Skin  Warm  Dry  Neuro Component Findings  Mental Status Exam Alert and oriented Memory appropriate  Cranial Nerves   CN II Visual fields intact to confrontation  CN III, IV, VI PERRL EOMI Bilateral leftward nystagmus  CN V Facial sensation is normal No weakness of masticatory muscles  CN VII No facial weakness or asymmetry  CN VIII Auditory acuity grossly normal  CN IX and X Uvula is midline Palate elevates symmetrically  CN XI  Normal sternocleidomastoid and trapezius strength  CN XII The tongue is midline No tongue atrophy or fasciculations  Motor   Muscle Strength RUE: 4/5 flexion and  extension RLE: 4/5 flexion and extension LUE: 5/5 flexion and extension LLE: 5/5 flexion and extension  No pronation or drift  Muscle Tone Normal bulk and tone  Coordination Intact finger-to-nose No tremor + Romberg  Sensation Intact to light touch  Gait Difficultly ambulating, leans to right        Psychiatric  Mood and affect normal        ED Treatments / Results  Labs (all labs ordered are listed, but only abnormal results are displayed) Labs Reviewed  CBC WITH DIFFERENTIAL/PLATELET - Abnormal; Notable for the following components:      Result Value   Hemoglobin 15.5 (*)    HCT 49.3 (*)    MCV 102.9 (*)    All other components within normal limits  BASIC METABOLIC PANEL - Abnormal; Notable for the following components:   Glucose, Bld 134 (*)    Creatinine, Ser 1.27 (*)    GFR calc non Af Amer 40 (*)    GFR calc Af Amer 46 (*)    All other components within normal limits  CBG MONITORING, ED - Abnormal; Notable for the following components:   Glucose-Capillary 133 (*)    All other components within normal limits  TROPONIN I  ETHANOL  PROTIME-INR  APTT    EKG EKG Interpretation  Date/Time:  Sunday January 08 2018 15:37:05 EDT Ventricular Rate:  69 PR Interval:    QRS Duration: 98 QT Interval:  427 QTC Calculation: 458 R Axis:   66 Text Interpretation:  Sinus rhythm nonspecific st changes resolved compared to last tracing Confirmed by Linwood Dibbles [phone]) on 01/08/2018 3:54:09 PM   Radiology Dg Chest 2 View  Result Date: 01/08/2018 CLINICAL DATA:  Syncope. Confusion. History of stroke, hypertension and myocardial infarction. EXAM: CHEST - 2 VIEW COMPARISON:  11/02/2017. FINDINGS: The heart size and mediastinal contours are stable. There is aortic and coronary artery atherosclerosis. Presternal loop recorder noted. The lungs are clear. There is no pleural effusion or pneumothorax. Degenerative changes are present throughout the spine. Telemetry leads overlie  the chest. IMPRESSION: Stable chest.  No acute cardiopulmonary process. Electronically Signed   By: Hilarie Fredrickson.D.  On: 01/08/2018 17:09   Ct Head Wo Contrast  Result Date: 01/08/2018 CLINICAL DATA:  Family reports that patient appears confused and agitated, husband told EMS that patient's speech "is not normal" Patient states, "my family called ambulance because I'm dizzy, I didn't eat right last night, my sugar is down, I staggered and they called. EXAM: CT HEAD WITHOUT CONTRAST TECHNIQUE: Contiguous axial images were obtained from the base of the skull through the vertex without intravenous contrast. COMPARISON:  11/02/2017 FINDINGS: Brain: There is a remote lacunar infarct of the pons. Remote lacunar infarcts are identified in the thalami bilaterally, RIGHT caudate nucleus and RIGHT internal capsule. Stable encephalomalacia in the RIGHT frontal lobe. There is central and cortical atrophy. Periventricular white matter changes are consistent with small vessel disease. There is no intra or extra-axial fluid collection or mass lesion. The basilar cisterns and ventricles have a normal appearance. There is no CT evidence for acute infarction or hemorrhage. Vascular: There is atherosclerotic calcification of the internal carotid arteries. No hyperdense vessels. Skull: Normal. Negative for fracture or focal lesion. Sinuses/Orbits: There is minimal LEFT maxillary sinus wall thickening. Other: None IMPRESSION: 1.  No evidence for acute  abnormality. 2. Stable chronic infarcts of the RIGHT frontal lobe, pons, basal ganglia, and thalami. 3. Minimal sinus disease. 4. Atrophy and small vessel disease. Electronically Signed   By: Norva Pavlov M.D.   On: 01/08/2018 17:06    Procedures Procedures (including critical care time)  Medications Ordered in ED Medications  acetaminophen (TYLENOL) tablet 500 mg (500 mg Oral Given 01/08/18 1845)     Initial Impression / Assessment and Plan / ED Course  I have  reviewed the triage vital signs and the nursing notes.  Pertinent labs & imaging results that were available during my care of the patient were reviewed by me and considered in my medical decision making (see chart for details).     JOLIENE SALVADOR presents with dizziness, unresponsiveness earlier, difficultly speaking, and being altered earlier.  She is hemodynamically stable.  She is awake and alert.  Her neurological exam is significant for nystagmus and balance problems.  She has difficulty walking.  She appears mildly ataxic.  However, she says that this is a chronic problem.  I am concerned for syncope as well as for stroke.  No significant abnormalities on CBC or BMP.  Troponin elevated.  New nonspecific T wave inversions in V1 and V2 on her ECG.  Otherwise, no signs of acute ischemia or dysrhythmia.  No acute abnormalities on CT head or chest x-ray.  We will discuss her case with neurology for further stroke evaluation and plan to admit her for stroke/syncope.  I am concerned for a posterior circulation issue given her difficulty walking, nystagmus, and complaint of dizziness.  Nursing call me at the bedside due to the patient being very irritable and wanting to leave AMA.  I discussed the risk and benefits of further therapy extensively with the patient.  During this time, neurology evaluated the patient.  They are also concerned for an acute stroke.  We would like to obtain MRI studies.  However, the patient adamantly refuses any further work-up.  She became hostile and aggressive towards myself and other staff members.  She has capacity and can verbalize the risks/benefits of leaving and of staying.  I informed her that she could be having a stroke.  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  Her husband encouraged her to stay  but he says that he feels comfortable taking her home.  She demanded that she be given a wheelchair and discharged.  I encouraged her to begin taking a daily aspirin.    I again counseled the patient about her risks and encouraged her to stay for further evaluation and MRI.  She adamantly refused and remained aggressive and hostile.  She left AMA.  Final Clinical Impressions(s) / ED Diagnoses   Final diagnoses:  Dizziness  Syncope, unspecified syncope type    ED Discharge Orders    None       Talitha Givens, MD 01/08/18 2359    Linwood Dibbles, MD 01/10/18 [phone]

## 2018-01-08 NOTE — ED Notes (Signed)
Patient called to say she is "leaving" She states "I am getting out of here" patient is encouraged to stay for treatment, EDP notified

## 2018-01-08 NOTE — ED Provider Notes (Signed)
I saw and evaluated the patient, reviewed the resident's note and I agree with the findings and plan.  Pt presented with possible TIS, syncope , stroke.  We recommended admission to the hospital for further evaluation including MRI.   Neurology was consulted.  Husband was at the bedside and tried to convince pt to stay however she is leaving AMA.  Pt understands risks.  EKG: EKG Interpretation  Date/Time:  Sunday January 08 2018 15:37:05 EDT Ventricular Rate:  69 PR Interval:    QRS Duration: 98 QT Interval:  427 QTC Calculation: 458 R Axis:   66 Text Interpretation:  Sinus rhythm nonspecific st changes resolved compared to last tracing Confirmed by Linwood Dibbles [phone]) on 01/08/2018 3:54:09 PM     Linwood Dibbles, MD 01/08/18 [phone]

## 2018-01-08 NOTE — Discharge Instructions (Addendum)
Mackenzie Richards:   You are leaving AMA.  We recommend that you take a daily aspirin.  You could have had a stroke.  We recommended an MRI but you refused this.  Please see your primary doctor.

## 2018-01-24 ENCOUNTER — Ambulatory Visit (INDEPENDENT_AMBULATORY_CARE_PROVIDER_SITE_OTHER): Payer: Medicare HMO | Admitting: *Deleted

## 2018-01-24 DIAGNOSIS — I639 Cerebral infarction, unspecified: Secondary | ICD-10-CM | POA: Diagnosis not present

## 2018-01-25 NOTE — Progress Notes (Signed)
Carelink Summary Report / Loop Recorder 

## 2018-01-28 ENCOUNTER — Encounter (HOSPITAL_COMMUNITY): Payer: Self-pay | Admitting: Student

## 2018-01-28 NOTE — OR Nursing (Signed)
Late entry due to data entry error.

## 2018-02-02 ENCOUNTER — Other Ambulatory Visit: Payer: Self-pay | Admitting: *Deleted

## 2018-02-02 DIAGNOSIS — R5381 Other malaise: Secondary | ICD-10-CM

## 2018-02-08 ENCOUNTER — Other Ambulatory Visit: Payer: Self-pay | Admitting: *Deleted

## 2018-02-08 DIAGNOSIS — M8580 Other specified disorders of bone density and structure, unspecified site: Secondary | ICD-10-CM

## 2018-02-09 LAB — CUP PACEART REMOTE DEVICE CHECK
Date Time Interrogation Session: 20190718173837
MDC IDC PG IMPLANT DT: 20190311

## 2018-02-27 ENCOUNTER — Ambulatory Visit (INDEPENDENT_AMBULATORY_CARE_PROVIDER_SITE_OTHER): Payer: Medicare HMO | Admitting: *Deleted

## 2018-02-27 DIAGNOSIS — I639 Cerebral infarction, unspecified: Secondary | ICD-10-CM

## 2018-02-27 LAB — CUP PACEART REMOTE DEVICE CHECK
Implantable Pulse Generator Implant Date: 20190311
MDC IDC SESS DTM: 20190820180736

## 2018-02-27 NOTE — Progress Notes (Signed)
Carelink Summary Report / Loop Recorder 

## 2018-03-06 LAB — CUP PACEART REMOTE DEVICE CHECK
Implantable Pulse Generator Implant Date: 20190311
MDC IDC SESS DTM: 20190922183755

## 2018-03-27 ENCOUNTER — Encounter (HOSPITAL_COMMUNITY): Payer: Self-pay | Admitting: Emergency Medicine

## 2018-03-27 ENCOUNTER — Emergency Department (HOSPITAL_COMMUNITY): Payer: Medicare HMO

## 2018-03-27 ENCOUNTER — Emergency Department (HOSPITAL_COMMUNITY)
Admission: EM | Admit: 2018-03-27 | Discharge: 2018-03-27 | Disposition: A | Discharge status: Home / Self Care | Payer: Medicare HMO | Attending: Emergency Medicine | Admitting: Emergency Medicine

## 2018-03-27 DIAGNOSIS — I2510 Atherosclerotic heart disease of native coronary artery without angina pectoris: Secondary | ICD-10-CM | POA: Diagnosis not present

## 2018-03-27 DIAGNOSIS — J449 Chronic obstructive pulmonary disease, unspecified: Secondary | ICD-10-CM | POA: Diagnosis not present

## 2018-03-27 DIAGNOSIS — R410 Disorientation, unspecified: Secondary | ICD-10-CM | POA: Insufficient documentation

## 2018-03-27 DIAGNOSIS — I10 Essential (primary) hypertension: Secondary | ICD-10-CM | POA: Insufficient documentation

## 2018-03-27 DIAGNOSIS — G309 Alzheimer's disease, unspecified: Secondary | ICD-10-CM | POA: Diagnosis not present

## 2018-03-27 DIAGNOSIS — R52 Pain, unspecified: Secondary | ICD-10-CM

## 2018-03-27 DIAGNOSIS — F17210 Nicotine dependence, cigarettes, uncomplicated: Secondary | ICD-10-CM | POA: Diagnosis not present

## 2018-03-27 DIAGNOSIS — N390 Urinary tract infection, site not specified: Secondary | ICD-10-CM | POA: Insufficient documentation

## 2018-03-27 DIAGNOSIS — Z79899 Other long term (current) drug therapy: Secondary | ICD-10-CM | POA: Diagnosis not present

## 2018-03-27 DIAGNOSIS — R1011 Right upper quadrant pain: Secondary | ICD-10-CM | POA: Diagnosis not present

## 2018-03-27 HISTORY — DX: Alzheimer's disease, unspecified: G30.9

## 2018-03-27 HISTORY — DX: Dementia in other diseases classified elsewhere, unspecified severity, without behavioral disturbance, psychotic disturbance, mood disturbance, and anxiety: F02.80

## 2018-03-27 LAB — COMPREHENSIVE METABOLIC PANEL
ALBUMIN: 3.7 g/dL (ref 3.5–5.0)
ALT: 8 U/L (ref 0–44)
ANION GAP: 8 (ref 5–15)
AST: 12 U/L — ABNORMAL LOW (ref 15–41)
Alkaline Phosphatase: 78 U/L (ref 38–126)
BUN: 20 mg/dL (ref 8–23)
CO2: 25 mmol/L (ref 22–32)
Calcium: 8.8 mg/dL — ABNORMAL LOW (ref 8.9–10.3)
Chloride: 105 mmol/L (ref 98–111)
Creatinine, Ser: 1.1 mg/dL — ABNORMAL HIGH (ref 0.44–1.00)
GFR calc Af Amer: 55 mL/min — ABNORMAL LOW (ref >60)
GFR calc non Af Amer: 48 mL/min — ABNORMAL LOW (ref >60)
GLUCOSE: 131 mg/dL — AB (ref 70–99)
POTASSIUM: 4.1 mmol/L (ref 3.5–5.1)
SODIUM: 138 mmol/L (ref 135–145)
Total Bilirubin: 1 mg/dL (ref 0.3–1.2)
Total Protein: 7 g/dL (ref 6.5–8.1)

## 2018-03-27 LAB — URINALYSIS, ROUTINE W REFLEX MICROSCOPIC
BACTERIA UA: NONE SEEN
Bilirubin Urine: NEGATIVE
GLUCOSE, UA: NEGATIVE mg/dL
HGB URINE DIPSTICK: NEGATIVE
Ketones, ur: 5 mg/dL — AB
LEUKOCYTES UA: NEGATIVE
Nitrite: POSITIVE — AB
PH: 6 (ref 5.0–8.0)
Protein, ur: 100 mg/dL — AB
Specific Gravity, Urine: 1.024 (ref 1.005–1.030)

## 2018-03-27 LAB — CBC
HCT: 43.4 % (ref 36.0–46.0)
HEMOGLOBIN: 13.7 g/dL (ref 12.0–15.0)
MCH: 32.2 pg (ref 26.0–34.0)
MCHC: 31.6 g/dL (ref 30.0–36.0)
MCV: 101.9 fL — ABNORMAL HIGH (ref 80.0–100.0)
Platelets: 205 10*3/uL (ref 150–400)
RBC: 4.26 MIL/uL (ref 3.87–5.11)
RDW: 14.6 % (ref 11.5–15.5)
WBC: 13.5 10*3/uL — AB (ref 4.0–10.5)
nRBC: 0 % (ref 0.0–0.2)

## 2018-03-27 LAB — LIPASE, BLOOD: Lipase: 26 U/L (ref 11–51)

## 2018-03-27 LAB — LACTIC ACID, PLASMA: Lactic Acid, Venous: 1 mmol/L (ref 0.5–1.9)

## 2018-03-27 MED ORDER — CEPHALEXIN 500 MG PO CAPS: 500.0000 mg | ORAL_CAPSULE | Freq: Four times a day (QID) | ORAL | 0 refills | Status: AC

## 2018-03-27 MED ORDER — LIDOCAINE 5 % EX PTCH
1.0000 | MEDICATED_PATCH | CUTANEOUS | Status: DC
  Administered 2018-03-27: 1 via TRANSDERMAL
  Filled 2018-03-27: qty 1

## 2018-03-27 MED ORDER — CIPROFLOXACIN IN D5W 200 MG/100ML IV SOLN
200.0000 mg | Freq: Once | INTRAVENOUS | Status: AC
Start: 2018-03-27 — End: 2018-03-27
  Administered 2018-03-27: 200 mg via INTRAVENOUS
  Filled 2018-03-27: qty 100

## 2018-03-27 MED ORDER — LORAZEPAM 2 MG/ML IJ SOLN
1.0000 mg | Freq: Once | INTRAMUSCULAR | Status: AC
  Administered 2018-03-27: 1 mg via INTRAVENOUS
  Filled 2018-03-27: qty 1

## 2018-03-27 MED ORDER — HALOPERIDOL LACTATE 5 MG/ML IJ SOLN
1.0000 mg | Freq: Once | INTRAMUSCULAR | Status: AC
Start: 2018-03-27 — End: 2018-03-27
  Administered 2018-03-27: 1 mg via INTRAVENOUS
  Filled 2018-03-27: qty 1

## 2018-03-27 NOTE — ED Triage Notes (Signed)
Family reports the patient has had altered mental status from her normal baseline for a few days along with increased weakness. The Patient reports general aches.

## 2018-03-27 NOTE — ED Notes (Signed)
Patient is refusing for staff to obtain a lactic plasma. MD notified.

## 2018-03-27 NOTE — ED Provider Notes (Signed)
[name]-EMERGENCY DEPT Provider Note   CSN: [id number] Arrival date & time: 03/27/18  1107     History   Chief Complaint Chief Complaint  Patient presents with  . Altered Mental Status  . Weakness    HPI Mackenzie Richards is a 77 y.o. 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  On speaking with the patient she was alert, conversational and able to phone appropriately.  Her only complaint is right upper quadrant discomfort for couple of days that is typically worse with movement.  She denies any trauma to the abdomen, fevers, chills, nausea, vomiting or any radiating pain.  She also denies chest pain, shortness of breath, cough, dysuria or any upper respiratory infection symptoms.  I was able to speak to a family member who made me aware that they were concerned that patient was having another stroke because she complained of general body aches and would not allow them to touch her.  Family member did not report of any weakness, dysarthria or aphasia.  Past Medical History:  Diagnosis Date  . Alzheimer's dementia (HCC)   . Arthritis   . Asthma   . Bowen's disease   . Breast cyst   . Complication of anesthesia    i AM SLOW TO WAKE UP   . Coronary artery disease   . CVA (cerebral vascular accident) (HCC) 08/2017  . Diverticulitis   . DVT (deep venous thrombosis) (HCC)   . Frequent UTI   . Hypertension   . Kidney cysts   . Myocardial infarction [facility]) 11/2016    Patient Active Problem List   Diagnosis Date Noted  . Unwitnessed fall 11/03/2017  . Closed comminuted intertrochanteric fracture of proximal end of right femur (HCC) 11/02/2017  . COPD (chronic obstructive  pulmonary disease) (HCC) 11/02/2017  . History of stroke in prior 3 months 11/02/2017  . History of DVT (deep vein thrombosis)   . Essential hypertension   . Hyperlipidemia   . Acute CVA (cerebrovascular accident) (HCC) 08/12/2017  . CAD (coronary artery disease) 08/12/2017  . Acute ST elevation myocardial infarction (STEMI) of inferior wall (HCC)   . Aborted myocardial infarction (HCC)   . 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 History of coronary artery stent placement (2009/2010) 03/18/2015  . Smoker 03/18/2015  . Aphasia     Past Surgical History:  Procedure Laterality Date  . BILATERAL KNEE ARTHROSCOPY    . CORONARY STENT INTERVENTION N/A 11/15/2016   Procedure: Coronary Stent Intervention;  Surgeon: Lyn Records, MD;  Location: [facility] INVASIVE CV LAB;  Service: Cardiovascular;  Laterality: N/A;  Distal RCA  . EYE SURGERY    . FEMUR IM NAIL Right 11/03/2017   Procedure: INTRAMEDULLARY (IM) NAIL FEMORAL;  Surgeon: Roby Lofts, MD;  Location: [facility] OR;  Service: Orthopedics;  Laterality: Right;  . LEFT HEART CATH AND CORONARY ANGIOGRAPHY N/A 11/15/2016   Procedure: Left Heart Cath and Coronary Angiography;  Surgeon: Lyn Records, MD;  Location: [facility] INVASIVE CV LAB;  Service: Cardiovascular;  Laterality: N/A;  . LOOP RECORDER INSERTION N/A 08/15/2017   Procedure: LOOP RECORDER INSERTION;  Surgeon: Duke Salvia, MD;  Location: [facility] INVASIVE CV LAB;  Service: Cardiovascular;  Laterality: N/A;  .  TEE WITHOUT CARDIOVERSION N/A 08/15/2017   Procedure: TRANSESOPHAGEAL ECHOCARDIOGRAM (TEE);  Surgeon: Lewayne Bunting, MD;  Location: [facility] ENDOSCOPY;  Service: Cardiovascular;  Laterality: N/A;  . TUBAL LIGATION       OB History   None      Home Medications    Prior to Admission medications   Medication Sig Start Date End Date Taking? Authorizing Provider  acetaminophen (TYLENOL) 500 MG tablet Take 1,000 mg by mouth every 6 (six) hours as needed for headache (pain).     [provider]  aspirin 325 MG tablet Take 325 mg by mouth daily.    [provider]  atorvastatin (LIPITOR) 80 MG tablet Take 1 tablet (80 mg total) by mouth daily at 6 PM. 08/15/17   Darlin Drop, DO  bisacodyl (DULCOLAX) 5 MG EC tablet Take 1 tablet (5 mg total) by mouth daily as needed for moderate constipation. Patient not taking: Reported on 01/08/2018 11/05/17   Roberto Scales D, MD  cloNIDine (CATAPRES) 0.1 MG tablet Take 1 tablet (0.1 mg total) by mouth 2 (two) times daily. 08/15/17   Darlin Drop, DO  clopidogrel (PLAVIX) 75 MG tablet Take 1 tablet (75 mg total) by mouth daily. Patient not taking: Reported on 01/08/2018 11/06/17   Laverna Peace, MD  methocarbamol (ROBAXIN) 500 MG tablet Take 1 tablet (500 mg total) by mouth every 6 (six) hours as needed for muscle spasms. Patient not taking: Reported on 01/08/2018 11/05/17   Roberto Scales D, MD  metoprolol tartrate (LOPRESSOR) 25 MG tablet Take 1 tablet (25 mg total) by mouth 2 (two) times daily. 08/15/17   Darlin Drop, DO  nitroGLYCERIN (NITROSTAT) 0.4 MG SL tablet Place 1 tablet (0.4 mg total) under the tongue every 5 (five) minutes as needed. Patient taking differently: Place 0.4 mg under the tongue every 5 (five) minutes as needed for chest pain.  11/17/16   Arty Baumgartner, NP  polyethylene glycol (MIRALAX / GLYCOLAX) packet Take 17 g by mouth daily as needed for mild constipation. Patient not taking: Reported on 01/08/2018 11/05/17   Roberto Scales D, MD  zolpidem (AMBIEN) 10 MG tablet Take 10 mg by mouth at bedtime.     [provider]    Family History Family History  Problem Relation Age of Onset  . Hypertension Other     Social History Social History   Tobacco Use  . Smoking status: Current Every Day Smoker    Packs/day: 1.50    Years: 51.00    Pack years: 76.50    Types: Cigarettes  . Smokeless tobacco: Never Used  Substance Use Topics  . Alcohol use: No  . Drug use: No     Allergies     Fentanyl; Enalapril; Hydrochlorothiazide; Lasix [furosemide]; Levaquin [levofloxacin in d5w]; Percocet [oxycodone-acetaminophen]; Sulfa antibiotics; Zyban [bupropion]; Biaxin [clarithromycin]; and Penicillins   Review of Systems Review of Systems  Constitutional: Negative.   HENT: Negative.   Eyes: Negative.   Respiratory: Negative.   Cardiovascular: Negative.   Gastrointestinal: Positive for abdominal pain (right upper quadrant/lower rib).  Genitourinary: Negative.   Musculoskeletal: Negative.   Skin: Negative.   Neurological: Negative.   Psychiatric/Behavioral: Negative.      Physical Exam Updated Vital Signs BP (!) 166/65   Pulse 65   Temp 98.7 F (37.1 C) (Oral)   Resp (!) 21   SpO2 98%   Physical Exam  Constitutional: She is oriented to person, place, and time. She appears well-developed and  well-nourished.  HENT:  Head: Normocephalic and atraumatic.  Eyes: EOM are normal.  Neck: Normal range of motion.  Cardiovascular: Normal rate, regular rhythm and normal heart sounds. Exam reveals no gallop and no friction rub.  No murmur heard. Pulmonary/Chest: Effort normal and breath sounds normal.  Abdominal: Soft. Bowel sounds are normal. She exhibits no distension. There is tenderness (right upper quadrant/lower rib). There is no rebound and no guarding.  Neurological: She is alert and oriented to person, place, and time. No cranial nerve deficit or sensory deficit. Coordination normal.  Patient was alert and oriented to place, person Cranial nerves II through XII intact No focal neurological deficits  Skin: Skin is warm.  Psychiatric: She has a normal mood and affect. Her behavior is normal. Judgment and thought content normal.    ED Treatments / Results  Labs (all labs ordered are listed, but only abnormal results are displayed) Labs Reviewed  CBC - Abnormal; Notable for the following components:      Result Value   WBC 13.5 (*)    MCV 101.9 (*)    All other  components within normal limits  COMPREHENSIVE METABOLIC PANEL - Abnormal; Notable for the following components:   Glucose, Bld 131 (*)    Creatinine, Ser 1.10 (*)    Calcium 8.8 (*)    AST 12 (*)    GFR calc non Af Amer 48 (*)    GFR calc Af Amer 55 (*)    All other components within normal limits  URINALYSIS, ROUTINE W REFLEX MICROSCOPIC - Abnormal; Notable for the following components:   Color, Urine AMBER (*)    Ketones, ur 5 (*)    Protein, ur 100 (*)    Nitrite POSITIVE (*)    All other components within normal limits  URINE CULTURE  LIPASE, BLOOD  LACTIC ACID, PLASMA  LACTIC ACID, PLASMA    EKG EKG Interpretation  Date/Time:  Monday March 27 2018 11:32:37 EDT Ventricular Rate:  58 PR Interval:    QRS Duration: 92 QT Interval:  448 QTC Calculation: 440 R Axis:   31 Text Interpretation:  Sinus rhythm Borderline T abnormalities, anterior leads Confirmed by Lorre Nick ([id number]) on 03/27/2018 12:22:30 PM   Radiology No results found.  Procedures Procedures (including critical care time)  Medications Ordered in ED Medications  lidocaine (LIDODERM) 5 % 1 patch (1 patch Transdermal Patch Applied 03/27/18 1312)  haloperidol lactate (HALDOL) injection 1 mg (has no administration in time range)  LORazepam (ATIVAN) injection 1 mg (has no administration in time range)  ciprofloxacin (CIPRO) IVPB 200 mg (has no administration in time range)     Initial Impression / Assessment and Plan / ED Course  I have reviewed the triage vital signs and the nursing notes.  Pertinent labs & imaging results that were available during my care of the patient were reviewed by me and considered in my medical decision making (see chart for details).    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  Speaking with the family member, patient has had generalized body aches for a couple of days and had not allowed to be touched.  There were no reports of change with mentation, confusion, upper or lower extremity weakness or focal  neurological deficits.  Per speaking with the patient, she was alert and oriented to place and person, conversational, follow commands and able to respond appropriately.  Her only complaint was an achy/discomfort sensation at her right upper quadrant with no associated fevers, chills, nausea, vomiting.  Also denied urinary symptoms.  On physical exams, cranial nerves were intact with no focal neurological deficits.  She did have right upper quadrant/lower rib tenderness.  She was reevaluated at 14:33 and she remained alert and oriented to place, time, person and birthdate.  Patient was later on seen in the hallway, yelling and trying to leave the hospital.  Stating that she is able to care for herself.  She pulled out her IV.  Her urinalysis came back positive for nitrites, CBC shows leukocytosis.  Her current altered mental status is most likely secondary to an ongoing urinary tract infection and she will be started on Cipro given that she has severe allergic reaction to penicillin.  Patient is refusing to leave and refusing treatment however the husband wants her to stay and she will be IVC'd.   She has been successfully signed out to the oncoming team.  Final Clinical Impressions(s) / ED Diagnoses   Final diagnoses:  Right upper quadrant abdominal pain  Body aches  Lower urinary tract infectious disease    ED Discharge Orders    None       Yvette Rack, MD 03/27/18 1641    Lorre Nick, MD 03/28/18 [phone]

## 2018-03-27 NOTE — ED Provider Notes (Addendum)
Patient signed out that CT was pending, and that plan is to treat for possible uti.    CT results reviewed - no acute process. Discussed incidentally noted renal mass/lesion 1.7 cm (was previously 1.4 cm)  w pt/spouse, and need for outpt f/u and imaging. Ct head neg acute. cxr with interstitial prom - c/w prior - on recheck - no cough, lying flat with no sob, pulse ox 96% room air, no leg edema.    Recheck, pt is awake and alert. Ambulatory. Pt requests d/c, does not want to stay in hospital.   No acute psychosis or delusions. Pt denies depression.   Vitals:   03/27/18 1942 03/27/18 1959  BP: (!) 161/62 (!) 161/62  Pulse: 75 75  Resp: 18 20  Temp:  97.7 F (36.5 C)  SpO2: 96% 95%   Pt afebrile, mental status currently c/w baseline. abd soft nt. Chest cta. No increased wob.  No neck stiffness or rigidity.    Recommend close pcp f/u as outpt. Return precautions provided.         Cathren Laine, MD 03/27/18 2326

## 2018-03-27 NOTE — ED Provider Notes (Signed)
I saw and evaluated the patient, reviewed the resident's note and I agree with the findings and plan.  EKG: EKG Interpretation  Date/Time:  Monday March 27 2018 11:32:37 EDT Ventricular Rate:  58 PR Interval:    QRS Duration: 92 QT Interval:  448 QTC Calculation: 440 R Axis:   31 Text Interpretation:  Sinus rhythm Borderline T abnormalities, anterior leads Confirmed by Lorre Nick ([id number]) on 03/27/2018 12:71:83 PM   77 year old female resents with abdominal discomfort.  Patient with possible altered mental status.  On exam patient is alert and oriented x4.  Diffuse tenderness noted on her abdominal exam.  Patient's abdomen does not appear to be surgical at this time.  Patient's imaging is pending at this time.   Lorre Nick, MD 03/30/18 1220

## 2018-03-27 NOTE — Discharge Instructions (Addendum)
It was our pleasure to provide your ER care today - we hope that you feel better.  Take antibiotic as prescribed.   Follow up with primary care doctor in the next 1-2 days for recheck - call office in AM to arrange follow up. Your CT scan shows no acute process, but as we discussed, note was made of 1.7 cm renal lesion - discuss with your doctor and have them arrange outpatient MRI, or CT w contrast to further assess.   You may also follow up with neurologist - see referral, call to arrange appointment.   Return to ER if worse, new symptoms, fevers, trouble breathing, persistent vomiting, new or severe pain, other concern.

## 2018-03-29 LAB — URINE CULTURE

## 2018-03-31 ENCOUNTER — Ambulatory Visit (INDEPENDENT_AMBULATORY_CARE_PROVIDER_SITE_OTHER): Payer: Medicare HMO | Admitting: *Deleted

## 2018-03-31 DIAGNOSIS — I639 Cerebral infarction, unspecified: Secondary | ICD-10-CM | POA: Diagnosis not present

## 2018-04-01 NOTE — Progress Notes (Signed)
Carelink Summary Report / Loop Recorder 

## 2018-04-14 LAB — CUP PACEART REMOTE DEVICE CHECK
Date Time Interrogation Session: 20191025193711
MDC IDC PG IMPLANT DT: 20190311

## 2018-04-25 ENCOUNTER — Telehealth: Payer: Self-pay

## 2018-04-25 ENCOUNTER — Ambulatory Visit: Payer: Medicare HMO | Admitting: Neurology

## 2018-04-25 ENCOUNTER — Telehealth: Payer: Self-pay | Admitting: Neurology

## 2018-04-25 NOTE — Telephone Encounter (Signed)
Pt no showed today on 04/25/2018.  Pt no showed on 09/29/2017, husband call cancel pt was sick.

## 2018-04-25 NOTE — Telephone Encounter (Signed)
FYI- patient is a new patient and has no-showed twice in 2019 °

## 2018-04-25 NOTE — Telephone Encounter (Signed)
Note    Pt no showed today on 04/25/2018.  Pt no showed on 09/29/2017, husband call cancel pt was sick.

## 2018-04-26 NOTE — Telephone Encounter (Signed)
Mackenzie Richards       11:01 AM  Note    Called patient to reschedule appointment. Patient didn't answer so I left a VM to r/s appt. If needed.

## 2018-04-26 NOTE — Telephone Encounter (Signed)
Yes but call her and see if she really wants to be seen or not prior to making appt

## 2018-04-26 NOTE — Telephone Encounter (Signed)
Called patient to reschedule appointment. Patient didn't answer so I left a VM to r/s appt. If needed.

## 2018-04-28 ENCOUNTER — Telehealth: Payer: Self-pay

## 2018-04-28 NOTE — Telephone Encounter (Signed)
LMOVM requesting that pt send manual transmission b/c home monitor has not updated in at least 14 days.    

## 2018-05-01 ENCOUNTER — Encounter: Payer: Self-pay | Admitting: Cardiology

## 2018-05-03 ENCOUNTER — Ambulatory Visit (INDEPENDENT_AMBULATORY_CARE_PROVIDER_SITE_OTHER): Payer: Medicare HMO

## 2018-05-03 DIAGNOSIS — I639 Cerebral infarction, unspecified: Secondary | ICD-10-CM | POA: Diagnosis not present

## 2018-05-03 NOTE — Progress Notes (Signed)
Carelink Summary Report / Loop Recorder 

## 2018-05-17 ENCOUNTER — Encounter: Payer: Self-pay | Admitting: Cardiology

## 2018-05-23 ENCOUNTER — Ambulatory Visit: Payer: Medicare HMO | Admitting: Neurology

## 2018-05-23 ENCOUNTER — Encounter: Payer: Self-pay | Admitting: Cardiology

## 2018-05-23 ENCOUNTER — Telehealth: Payer: Self-pay

## 2018-05-23 NOTE — Telephone Encounter (Signed)
Spoke w/ pt and requested that she send a manual transmission b/c her home monitor has not updated in at least 14 days.   

## 2018-05-24 ENCOUNTER — Telehealth: Payer: Self-pay | Admitting: Neurology

## 2018-05-24 NOTE — Telephone Encounter (Signed)
FYI- Patient has no-showed 3 times in 2019. °

## 2018-05-24 NOTE — Telephone Encounter (Signed)
Okay to dismiss if it is necessary as per office policy

## 2018-05-25 ENCOUNTER — Encounter: Payer: Self-pay | Admitting: Neurology

## 2018-06-05 ENCOUNTER — Ambulatory Visit (INDEPENDENT_AMBULATORY_CARE_PROVIDER_SITE_OTHER): Payer: Medicare HMO

## 2018-06-05 DIAGNOSIS — I639 Cerebral infarction, unspecified: Secondary | ICD-10-CM

## 2018-06-06 NOTE — Progress Notes (Signed)
Carelink Summary Report / Loop Recorder 

## 2018-06-18 LAB — CUP PACEART REMOTE DEVICE CHECK
Date Time Interrogation Session: 20191127193947
MDC IDC PG IMPLANT DT: 20190311

## 2018-07-02 ENCOUNTER — Emergency Department (HOSPITAL_COMMUNITY)
Admission: EM | Admit: 2018-07-02 | Discharge: 2018-07-02 | Disposition: A | Discharge status: Home / Self Care | Payer: Medicare HMO | Attending: Emergency Medicine | Admitting: Emergency Medicine

## 2018-07-02 ENCOUNTER — Emergency Department (HOSPITAL_COMMUNITY): Payer: Medicare HMO

## 2018-07-02 DIAGNOSIS — S300XXA Contusion of lower back and pelvis, initial encounter: Secondary | ICD-10-CM

## 2018-07-02 DIAGNOSIS — S3992XA Unspecified injury of lower back, initial encounter: Secondary | ICD-10-CM | POA: Diagnosis present

## 2018-07-02 DIAGNOSIS — Y9301 Activity, walking, marching and hiking: Secondary | ICD-10-CM | POA: Diagnosis not present

## 2018-07-02 DIAGNOSIS — G309 Alzheimer's disease, unspecified: Secondary | ICD-10-CM | POA: Insufficient documentation

## 2018-07-02 DIAGNOSIS — W0110XA Fall on same level from slipping, tripping and stumbling with subsequent striking against unspecified object, initial encounter: Secondary | ICD-10-CM | POA: Diagnosis not present

## 2018-07-02 DIAGNOSIS — F17210 Nicotine dependence, cigarettes, uncomplicated: Secondary | ICD-10-CM | POA: Diagnosis not present

## 2018-07-02 DIAGNOSIS — Z955 Presence of coronary angioplasty implant and graft: Secondary | ICD-10-CM | POA: Insufficient documentation

## 2018-07-02 DIAGNOSIS — I2510 Atherosclerotic heart disease of native coronary artery without angina pectoris: Secondary | ICD-10-CM | POA: Insufficient documentation

## 2018-07-02 DIAGNOSIS — J449 Chronic obstructive pulmonary disease, unspecified: Secondary | ICD-10-CM | POA: Insufficient documentation

## 2018-07-02 DIAGNOSIS — Y999 Unspecified external cause status: Secondary | ICD-10-CM | POA: Diagnosis not present

## 2018-07-02 DIAGNOSIS — Z7902 Long term (current) use of antithrombotics/antiplatelets: Secondary | ICD-10-CM | POA: Diagnosis not present

## 2018-07-02 DIAGNOSIS — Z91040 Latex allergy status: Secondary | ICD-10-CM | POA: Insufficient documentation

## 2018-07-02 DIAGNOSIS — Z79899 Other long term (current) drug therapy: Secondary | ICD-10-CM | POA: Diagnosis not present

## 2018-07-02 DIAGNOSIS — F0390 Unspecified dementia without behavioral disturbance: Secondary | ICD-10-CM | POA: Insufficient documentation

## 2018-07-02 DIAGNOSIS — Y92009 Unspecified place in unspecified non-institutional (private) residence as the place of occurrence of the external cause: Secondary | ICD-10-CM | POA: Insufficient documentation

## 2018-07-02 MED ORDER — ACETAMINOPHEN 325 MG PO TABS
650.0000 mg | ORAL_TABLET | Freq: Once | ORAL | Status: AC
  Administered 2018-07-02: 650 mg via ORAL
  Filled 2018-07-02: qty 2

## 2018-07-02 NOTE — ED Notes (Signed)
Patient verbalizes understanding of discharge instructions. Opportunity for questioning and answers were provided. Armband removed by staff, pt discharged from ED by ambulatory

## 2018-07-02 NOTE — ED Triage Notes (Signed)
Pt bib [facility] from home d/t fall.  Pt reported to EMS that she was ambulating w/ walker and had a mechanical fall. Denies LOC, hitting head or dizziness prior to fall.  Pt c/o left hip pain.  Per EMS pt screaming upon their arrival, "I broke my hip."  A&O x 4.  Pelvic binder placed.  Pt denied pain after binding to EMS.

## 2018-07-02 NOTE — ED Provider Notes (Signed)
[facility] EMERGENCY DEPARTMENT Provider Note   CSN: [id number] Arrival date & time: 07/02/18  0145     History   Chief Complaint Chief Complaint  Patient presents with  . Fall    HPI Mackenzie Richards is a 78 y.o. female.  Patient presents to the emergency department for evaluation after a fall.  Patient reports that she was walking when she fell.  She was not using her walker when the fall occurred.  She lost her balance and fell backwards, landing on her backside.  She did not hit her head or lose consciousness.  Denies neck pain, headache.  She is complaining of pain in both of her hips and buttock area.     Past Medical History:  Diagnosis Date  . Alzheimer's dementia (HCC)   . Arthritis   . Asthma   . Bowen's disease   . Breast cyst   . Complication of anesthesia    i AM SLOW TO WAKE UP   . Coronary artery disease   . CVA (cerebral vascular accident) (HCC) 08/2017  . Diverticulitis   . DVT (deep venous thrombosis) (HCC)   . Frequent UTI   . Hypertension   . Kidney cysts   . Myocardial infarction [facility]) 11/2016    Patient Active Problem List   Diagnosis Date Noted  . Unwitnessed fall 11/03/2017  . Closed comminuted intertrochanteric fracture of proximal end of right femur (HCC) 11/02/2017  . COPD (chronic obstructive pulmonary disease) (HCC) 11/02/2017  . History of stroke in prior 3 months 11/02/2017  . History of DVT (deep vein thrombosis)   . Essential hypertension   . Hyperlipidemia   . Acute CVA (cerebrovascular accident) (HCC) 08/12/2017  . CAD (coronary artery disease) 08/12/2017  . Acute ST elevation myocardial infarction (STEMI) of inferior wall (HCC)   . Aborted myocardial infarction (HCC)   . 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 History of coronary artery stent placement (2009/2010) 03/18/2015  . Smoker 03/18/2015  . Aphasia     Past Surgical History:  Procedure Laterality Date  . BILATERAL KNEE ARTHROSCOPY    .  CORONARY STENT INTERVENTION N/A 11/15/2016   Procedure: Coronary Stent Intervention;  Surgeon: Lyn Records, MD;  Location: [facility] INVASIVE CV LAB;  Service: Cardiovascular;  Laterality: N/A;  Distal RCA  . EYE SURGERY    . FEMUR IM NAIL Right 11/03/2017   Procedure: INTRAMEDULLARY (IM) NAIL FEMORAL;  Surgeon: Roby Lofts, MD;  Location: [facility] OR;  Service: Orthopedics;  Laterality: Right;  . LEFT HEART CATH AND CORONARY ANGIOGRAPHY N/A 11/15/2016   Procedure: Left Heart Cath and Coronary Angiography;  Surgeon: Lyn Records, MD;  Location: [facility] INVASIVE CV LAB;  Service: Cardiovascular;  Laterality: N/A;  . LOOP RECORDER INSERTION N/A 08/15/2017   Procedure: LOOP RECORDER INSERTION;  Surgeon: Duke Salvia, MD;  Location: [facility] INVASIVE CV LAB;  Service: Cardiovascular;  Laterality: N/A;  . TEE WITHOUT CARDIOVERSION N/A 08/15/2017   Procedure: TRANSESOPHAGEAL ECHOCARDIOGRAM (TEE);  Surgeon: Lewayne Bunting, MD;  Location: [facility] ENDOSCOPY;  Service: Cardiovascular;  Laterality: N/A;  . TUBAL LIGATION       OB History   No obstetric history on file.      Home Medications    Prior to Admission medications   Medication Sig Start Date End Date Taking? Authorizing Provider  acetaminophen (TYLENOL) 500 MG tablet Take 1,000 mg by mouth every 6 (six) hours as needed for headache (pain).     [provider]  aspirin 325 MG tablet Take 325 mg by mouth daily.    [provider]  atorvastatin (LIPITOR) 80 MG tablet Take 1 tablet (80 mg total) by mouth daily at 6 PM. 08/15/17   Darlin DropHall, Carole N, DO  bisacodyl (DULCOLAX) 5 MG EC tablet Take 1 tablet (5 mg total) by mouth daily as needed for moderate constipation. Patient not taking: Reported on 03/27/2018 11/05/17   Roberto ScalesNettey, Shayla D, MD  cephALEXin (KEFLEX) 500 MG capsule Take 1 capsule (500 mg total) by mouth 4 (four) times daily. 03/27/18   Cathren LaineSteinl, Kevin, MD  cloNIDine (CATAPRES) 0.1 MG tablet Take 1 tablet (0.1 mg total) by mouth 2 (two) times  daily. 08/15/17   Darlin DropHall, Carole N, DO  clopidogrel (PLAVIX) 75 MG tablet Take 1 tablet (75 mg total) by mouth daily. 11/06/17   Laverna PeaceNettey, Shayla D, MD  methocarbamol (ROBAXIN) 500 MG tablet Take 1 tablet (500 mg total) by mouth every 6 (six) hours as needed for muscle spasms. Patient not taking: Reported on 03/27/2018 11/05/17   Roberto ScalesNettey, Shayla D, MD  metoprolol tartrate (LOPRESSOR) 25 MG tablet Take 1 tablet (25 mg total) by mouth 2 (two) times daily. 08/15/17   Darlin DropHall, Carole N, DO  nitroGLYCERIN (NITROSTAT) 0.4 MG SL tablet Place 1 tablet (0.4 mg total) under the tongue every 5 (five) minutes as needed. Patient taking differently: Place 0.4 mg under the tongue every 5 (five) minutes as needed for chest pain.  11/17/16   Arty Baumgartneroberts, Lindsay B, NP  polyethylene glycol (MIRALAX / GLYCOLAX) packet Take 17 g by mouth daily as needed for mild constipation. 11/05/17   Laverna PeaceNettey, Shayla D, MD  zolpidem (AMBIEN) 10 MG tablet Take 10 mg by mouth at bedtime.     [provider]    Family History Family History  Problem Relation Age of Onset  . Hypertension Other     Social History Social History   Tobacco Use  . Smoking status: Current Every Day Smoker    Packs/day: 1.50    Years: 51.00    Pack years: 76.50    Types: Cigarettes  . Smokeless tobacco: Never Used  Substance Use Topics  . Alcohol use: No  . Drug use: No     Allergies   Fentanyl; Enalapril; Hydrochlorothiazide; Lasix [furosemide]; Levaquin [levofloxacin in d5w]; Percocet [oxycodone-acetaminophen]; Sulfa antibiotics; Zyban [bupropion]; Biaxin [clarithromycin]; and Penicillins   Review of Systems Review of Systems   Physical Exam Updated Vital Signs BP (!) 201/75   Pulse 74   Temp 97.8 F (36.6 C) (Oral)   Resp 16   SpO2 95%   Physical Exam   ED Treatments / Results  Labs (all labs ordered are listed, but only abnormal results are displayed) Labs Reviewed - No data to display  EKG None  Radiology Dg Lumbar Spine  Complete  Result Date: 07/02/2018 CLINICAL DATA:  Back pain after fall EXAM: LUMBAR SPINE - COMPLETE 4+ VIEW COMPARISON:  CT 03/27/2018 FINDINGS: There are 5 non ribbed lumbar type vertebral bodies. Grade 1 bordering grade 2 anterolisthesis of L5 on S1 by 13 mm is suggested the lateral projection. Moderate-to-marked disc flattening is identified along the course of the lumbar spine L1 through S1. No pars defects or listhesis. Degenerative facet arthropathy seen L2 through S. are demineralized. Lower thoracic spondylosis is also noted with degenerative disc space narrowing. No pelvic diastasis. Aortic atherosclerosis is noted without definite aneurysm. IMPRESSION: 1. Lumbar spondylosis with moderate-to-marked disc flattening along the course of the  lumbar spine L1 through S1. 2. Grade 1 bordering grade 2 anterolisthesis of L5 on S1 by 13 mm. This appears more accentuated than on the prior CT. 3. No acute osseous abnormality. Electronically Signed   By: Tollie Eth M.D.   On: 07/02/2018 03:24   Dg Hips Bilat W Or Wo Pelvis 3-4 Views  Result Date: 07/02/2018 CLINICAL DATA:  Patient fell at home landing on the bottom and has bilateral hip and lower back pain. EXAM: DG HIP (WITH OR WITHOUT PELVIS) 3-4V BILAT COMPARISON:  None. FINDINGS: There is degenerative disc disease and facet arthropathy of the included lumbar spine from L4 through S1. No pelvic diastasis is noted. Hip joints are maintained bilaterally. No acute fracture of either hip. Intact femoral nail fixation of the right femur. Urinary catheter device is identified. IMPRESSION: 1. No acute osseous abnormality of the pelvis and either hip. 2. Lower lumbar degenerative disc and facet arthropathy. Electronically Signed   By: Tollie Eth M.D.   On: 07/02/2018 03:21    Procedures Procedures (including critical care time)  Medications Ordered in ED Medications  acetaminophen (TYLENOL) tablet 650 mg (650 mg Oral Given 07/02/18 0359)     Initial  Impression / Assessment and Plan / ED Course  I have reviewed the triage vital signs and the nursing notes.  Pertinent labs & imaging results that were available during my care of the patient were reviewed by me and considered in my medical decision making (see chart for details).     Patient presents after a fall at home.  Patient normally uses a walker to ambulate but did not use her walker tonight.  She lost her balance and fell backwards, into a seated position.  She is complaining of pain in her backside and bilateral hips.  She is moving her legs without difficulty while lying in the bed.  No deformities noted.  Lumbosacral x-ray, bilateral hip and pelvis x-ray are negative.  She has ambulated here in the ER without difficulty, will be appropriate for discharge.  Final Clinical Impressions(s) / ED Diagnoses   Final diagnoses:  Coccygeal contusion, initial encounter    ED Discharge Orders    None       Pollina, Canary Brim, MD 07/02/18 [phone]

## 2018-11-12 ENCOUNTER — Inpatient Hospital Stay (HOSPITAL_COMMUNITY)
Admission: EM | Admit: 2018-11-12 | Discharge: 2018-11-13 | DRG: 300 | Disposition: A | Discharge status: Home / Self Care | Payer: Medicare HMO | Attending: Internal Medicine | Admitting: Internal Medicine

## 2018-11-12 ENCOUNTER — Encounter (HOSPITAL_COMMUNITY): Payer: Self-pay

## 2018-11-12 ENCOUNTER — Other Ambulatory Visit: Payer: Self-pay

## 2018-11-12 DIAGNOSIS — F17200 Nicotine dependence, unspecified, uncomplicated: Secondary | ICD-10-CM | POA: Diagnosis present

## 2018-11-12 DIAGNOSIS — Z955 Presence of coronary angioplasty implant and graft: Secondary | ICD-10-CM | POA: Diagnosis not present

## 2018-11-12 DIAGNOSIS — N179 Acute kidney failure, unspecified: Secondary | ICD-10-CM | POA: Diagnosis present

## 2018-11-12 DIAGNOSIS — Z8249 Family history of ischemic heart disease and other diseases of the circulatory system: Secondary | ICD-10-CM

## 2018-11-12 DIAGNOSIS — G309 Alzheimer's disease, unspecified: Secondary | ICD-10-CM | POA: Diagnosis present

## 2018-11-12 DIAGNOSIS — M1990 Unspecified osteoarthritis, unspecified site: Secondary | ICD-10-CM | POA: Diagnosis present

## 2018-11-12 DIAGNOSIS — M79604 Pain in right leg: Secondary | ICD-10-CM | POA: Diagnosis not present

## 2018-11-12 DIAGNOSIS — I10 Essential (primary) hypertension: Secondary | ICD-10-CM | POA: Diagnosis present

## 2018-11-12 DIAGNOSIS — I69320 Aphasia following cerebral infarction: Secondary | ICD-10-CM | POA: Diagnosis not present

## 2018-11-12 DIAGNOSIS — I739 Peripheral vascular disease, unspecified: Secondary | ICD-10-CM | POA: Diagnosis present

## 2018-11-12 DIAGNOSIS — Z86718 Personal history of other venous thrombosis and embolism: Secondary | ICD-10-CM | POA: Diagnosis not present

## 2018-11-12 DIAGNOSIS — Z66 Do not resuscitate: Secondary | ICD-10-CM | POA: Diagnosis present

## 2018-11-12 DIAGNOSIS — R4701 Aphasia: Secondary | ICD-10-CM | POA: Diagnosis present

## 2018-11-12 DIAGNOSIS — I82401 Acute embolism and thrombosis of unspecified deep veins of right lower extremity: Secondary | ICD-10-CM | POA: Diagnosis not present

## 2018-11-12 DIAGNOSIS — F0280 Dementia in other diseases classified elsewhere without behavioral disturbance: Secondary | ICD-10-CM | POA: Diagnosis present

## 2018-11-12 DIAGNOSIS — M7989 Other specified soft tissue disorders: Secondary | ICD-10-CM | POA: Diagnosis not present

## 2018-11-12 DIAGNOSIS — Z882 Allergy status to sulfonamides status: Secondary | ICD-10-CM

## 2018-11-12 DIAGNOSIS — Z881 Allergy status to other antibiotic agents status: Secondary | ICD-10-CM | POA: Diagnosis not present

## 2018-11-12 DIAGNOSIS — I82411 Acute embolism and thrombosis of right femoral vein: Principal | ICD-10-CM | POA: Diagnosis present

## 2018-11-12 DIAGNOSIS — Z86007 Personal history of in-situ neoplasm of skin: Secondary | ICD-10-CM

## 2018-11-12 DIAGNOSIS — I82441 Acute embolism and thrombosis of right tibial vein: Secondary | ICD-10-CM | POA: Diagnosis present

## 2018-11-12 DIAGNOSIS — J449 Chronic obstructive pulmonary disease, unspecified: Secondary | ICD-10-CM | POA: Diagnosis present

## 2018-11-12 DIAGNOSIS — Z888 Allergy status to other drugs, medicaments and biological substances status: Secondary | ICD-10-CM

## 2018-11-12 DIAGNOSIS — Z79899 Other long term (current) drug therapy: Secondary | ICD-10-CM

## 2018-11-12 DIAGNOSIS — F17210 Nicotine dependence, cigarettes, uncomplicated: Secondary | ICD-10-CM | POA: Diagnosis present

## 2018-11-12 DIAGNOSIS — Z7982 Long term (current) use of aspirin: Secondary | ICD-10-CM

## 2018-11-12 DIAGNOSIS — I252 Old myocardial infarction: Secondary | ICD-10-CM | POA: Diagnosis not present

## 2018-11-12 DIAGNOSIS — I824Z1 Acute embolism and thrombosis of unspecified deep veins of right distal lower extremity: Secondary | ICD-10-CM | POA: Diagnosis present

## 2018-11-12 DIAGNOSIS — Z87440 Personal history of urinary (tract) infections: Secondary | ICD-10-CM

## 2018-11-12 DIAGNOSIS — E785 Hyperlipidemia, unspecified: Secondary | ICD-10-CM | POA: Diagnosis present

## 2018-11-12 DIAGNOSIS — Z885 Allergy status to narcotic agent status: Secondary | ICD-10-CM

## 2018-11-12 DIAGNOSIS — I82431 Acute embolism and thrombosis of right popliteal vein: Secondary | ICD-10-CM | POA: Diagnosis present

## 2018-11-12 DIAGNOSIS — Z880 Allergy status to penicillin: Secondary | ICD-10-CM

## 2018-11-12 DIAGNOSIS — Z7902 Long term (current) use of antithrombotics/antiplatelets: Secondary | ICD-10-CM

## 2018-11-12 DIAGNOSIS — R2241 Localized swelling, mass and lump, right lower limb: Secondary | ICD-10-CM | POA: Diagnosis not present

## 2018-11-12 DIAGNOSIS — Z1159 Encounter for screening for other viral diseases: Secondary | ICD-10-CM

## 2018-11-12 DIAGNOSIS — I2510 Atherosclerotic heart disease of native coronary artery without angina pectoris: Secondary | ICD-10-CM | POA: Diagnosis present

## 2018-11-12 LAB — CBC WITH DIFFERENTIAL/PLATELET
Abs Immature Granulocytes: 0.04 10*3/uL (ref 0.00–0.07)
Basophils Absolute: 0.1 10*3/uL (ref 0.0–0.1)
Basophils Relative: 1 %
Eosinophils Absolute: 0.2 10*3/uL (ref 0.0–0.5)
Eosinophils Relative: 3 %
HCT: 38.4 % (ref 36.0–46.0)
Hemoglobin: 12.4 g/dL (ref 12.0–15.0)
Immature Granulocytes: 0 %
Lymphocytes Relative: 26 %
Lymphs Abs: 2.5 10*3/uL (ref 0.7–4.0)
MCH: 32.2 pg (ref 26.0–34.0)
MCHC: 32.3 g/dL (ref 30.0–36.0)
MCV: 99.7 fL (ref 80.0–100.0)
Monocytes Absolute: 0.7 10*3/uL (ref 0.1–1.0)
Monocytes Relative: 7 %
Neutro Abs: 6.1 10*3/uL (ref 1.7–7.7)
Neutrophils Relative %: 63 %
Platelets: 171 10*3/uL (ref 150–400)
RBC: 3.85 MIL/uL — ABNORMAL LOW (ref 3.87–5.11)
RDW: 13.8 % (ref 11.5–15.5)
WBC: 9.7 10*3/uL (ref 4.0–10.5)
nRBC: 0 % (ref 0.0–0.2)

## 2018-11-12 LAB — COMPREHENSIVE METABOLIC PANEL
ALT: 10 U/L (ref 0–44)
AST: 18 U/L (ref 15–41)
Albumin: 3.6 g/dL (ref 3.5–5.0)
Alkaline Phosphatase: 75 U/L (ref 38–126)
Anion gap: 12 (ref 5–15)
BUN: 39 mg/dL — ABNORMAL HIGH (ref 8–23)
CO2: 20 mmol/L — ABNORMAL LOW (ref 22–32)
Calcium: 9 mg/dL (ref 8.9–10.3)
Chloride: 107 mmol/L (ref 98–111)
Creatinine, Ser: 1.77 mg/dL — ABNORMAL HIGH (ref 0.44–1.00)
GFR calc Af Amer: 32 mL/min — ABNORMAL LOW (ref >60)
GFR calc non Af Amer: 27 mL/min — ABNORMAL LOW (ref >60)
Glucose, Bld: 118 mg/dL — ABNORMAL HIGH (ref 70–99)
Potassium: 4 mmol/L (ref 3.5–5.1)
Sodium: 139 mmol/L (ref 135–145)
Total Bilirubin: 1.5 mg/dL — ABNORMAL HIGH (ref 0.3–1.2)
Total Protein: 7 g/dL (ref 6.5–8.1)

## 2018-11-12 LAB — SARS CORONAVIRUS 2 BY RT PCR (HOSPITAL ORDER, PERFORMED IN ~~LOC~~ HOSPITAL LAB): SARS Coronavirus 2: NEGATIVE

## 2018-11-12 LAB — BRAIN NATRIURETIC PEPTIDE: B Natriuretic Peptide: 132.4 pg/mL — ABNORMAL HIGH (ref 0.0–100.0)

## 2018-11-12 LAB — LACTIC ACID, PLASMA: Lactic Acid, Venous: 1 mmol/L (ref 0.5–1.9)

## 2018-11-12 MED ORDER — HEPARIN (PORCINE) 25000 UT/250ML-% IV SOLN
750.0000 [IU]/h | INTRAVENOUS | Status: DC
  Administered 2018-11-13: 900 [IU]/h via INTRAVENOUS
  Filled 2018-11-12: qty 250

## 2018-11-12 MED ORDER — HEPARIN BOLUS VIA INFUSION
3000.0000 [IU] | Freq: Once | INTRAVENOUS | Status: AC
  Administered 2018-11-13: 3000 [IU] via INTRAVENOUS
  Filled 2018-11-12: qty 3000

## 2018-11-12 NOTE — H&P (Signed)
History and Physical   Mackenzie Richards ZOX:[id number] DOB: September 28, 1940 DOA: 11/12/2018  Referring MD/NP/PA: Dr. Jodi Richards  PCP: Marva PandaMillsaps, Kimberly, NP   Patient coming from: Home  Chief Complaint: Right leg swelling and pain  HPI: Mackenzie Richards is a 78 y.o. 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  Patient has had previous DVT but is a poor historian.  Is not clear as to when the swelling initially started.  Patient has markedly swollen right foot all the way to the thigh.  Is red.  No palpable pulses but tender.  Not consistent with cellulitis.  He has poor ABIs on the side.  Known peripheral vascular disease as well.  Patient notably has acute kidney injury so CTA is not possible tonight.  Suspected DVT versus arterial blockage.  Vascular surgery has been consulted and plan is to admit to medical service to get Doppler ultrasound of the venous circulation in the morning if negative vascular surgery will take patient to the OR for arterial imaging and intervention.  Patient is currently stable.  Pain is down to 4 out of 10.Marland Kitchen.  ED Course: Temperature is 9078 blood pressure 139/76 pulse 61 respiratory rate of 23 oxygen sat 98% room air.  White count is 9.7 hemoglobin 12.4 and platelets 171.  Sodium is 139 potassium 4.0 chloride 107 CO2 20 BUN 39 creatinine 1.77 calcium 9.1 glucose 118.  Lactic acid 1.0.  COVID-19 test is negative.  Review of Systems: As per HPI otherwise 10 point review of systems negative.    Past Medical History:  Diagnosis Date   Alzheimer's dementia (HCC)    Arthritis    Asthma    Bowen's disease    Breast cyst    Complication of anesthesia    i AM SLOW TO WAKE UP    Coronary artery disease    CVA (cerebral vascular accident) (HCC) 08/2017   Diverticulitis      DVT (deep venous thrombosis) (HCC)    Frequent UTI    Hypertension    Kidney cysts    Myocardial infarction (HCC) 11/2016    Past Surgical History:  Procedure Laterality Date   BILATERAL KNEE ARTHROSCOPY     CORONARY STENT INTERVENTION N/A 11/15/2016   Procedure: Coronary Stent Intervention;  Surgeon: Lyn Richards, Mackenzie W, MD;  Location: [facility]AS[facility];  Service: Cardiovascular;  Laterality: N/A;  Distal RCA   EYE SURGERY     FEMUR IM NAIL Right 11/03/2017   Procedure: INTRAMEDULLARY (IM) NAIL FEMORAL;  Surgeon: Mackenzie Richards, Mackenzie P, MD;  Location: [facility] OR;  Service: Orthopedics;  Laterality: Right;   LEFT HEART CATH AND CORONARY ANGIOGRAPHY N/A 11/15/2016   Procedure: Left Heart Cath and Coronary Angiography;  Surgeon: Lyn Richards, Mackenzie W, MD;  [facility];  Service: Cardiovascular;  Laterality: N/A;   LOOP RECORDER INSERTION N/A 08/15/2017   Procedure: LOOP RECORDER INSERTION;  Surgeon: Duke Richards, Mackenzie C, MD;  [facility];  Service: Cardiovascular;  Laterality: N/A;   TEE WITHOUT CARDIOVERSION N/A 08/15/2017   Procedure: TRANSESOPHAGEAL ECHOCARDIOGRAM (TEE);  Surgeon: Lewayne Richards, Mackenzie S, MD;  [facility];  Service: Cardiovascular;  Laterality: N/A;   TUBAL LIGATION       reports that she has been smoking cigarettes. She has a 76.50 pack-year smoking history. She has never used smokeless tobacco. She  reports that she does not drink alcohol or use drugs.  Allergies  Allergen Reactions   Fentanyl Other (See Comments)    "respiratory arrest"   Enalapril Itching and Other (See Comments)    Pt states she gets hyper   Hydrochlorothiazide Nausea And Vomiting   Lasix [Furosemide] Other (See Comments)    Caused low blood pressure   Levaquin [Levofloxacin In D5w] Nausea And Vomiting   Percocet [Oxycodone-Acetaminophen] Nausea And Vomiting   Sulfa Antibiotics Nausea And Vomiting   Zyban [Bupropion] Other (See Comments)    Unknown reaction    Biaxin [Clarithromycin] Rash   Penicillins Rash    Has patient had a PCN reaction causing immediate rash, facial/tongue/throat swelling, SOB or lightheadedness with hypotension: Yes Has patient had a PCN reaction causing severe rash involving mucus membranes or skin necrosis: No Has patient had a PCN reaction that required hospitalization: No Has patient had a PCN reaction occurring within the last 10 years: yes If all of the above answers are "NO", then may proceed with Cephalosporin use.     Family History  Problem Relation Age of Onset   Hypertension Other      Prior to Admission medications   Medication Sig Start Date End Date Taking? Authorizing Provider  acetaminophen (TYLENOL) 500 MG tablet Take 1,000 mg by mouth every 6 (six) hours as needed for headache (pain).     [provider]  aspirin 325 MG tablet Take 325 mg by mouth daily.    [provider]  atorvastatin (LIPITOR) 80 MG tablet Take 1 tablet (80 mg total) by mouth daily at 6 PM. 08/15/17   Darlin Drop, DO  bisacodyl (DULCOLAX) 5 MG EC tablet Take 1 tablet (5 mg total) by mouth daily as needed for moderate constipation. Patient not taking: Reported on 03/27/2018 11/05/17   Roberto Scales D, MD  cephALEXin (KEFLEX) 500 MG capsule Take 1 capsule (500 mg total) by mouth 4 (four) times daily. 03/27/18   Cathren Laine, MD  cloNIDine (CATAPRES) 0.1 MG tablet Take 1 tablet (0.1 mg total) by mouth 2 (two) times daily. 08/15/17   Darlin Drop, DO  clopidogrel (PLAVIX) 75 MG tablet Take 1 tablet (75 mg total) by mouth daily. 11/06/17   Laverna Peace, MD  methocarbamol (ROBAXIN) 500 MG tablet Take 1 tablet (500 mg total) by mouth every 6 (six) hours as needed for muscle spasms. Patient not taking: Reported on 03/27/2018 11/05/17   Roberto Scales D, MD  metoprolol tartrate (LOPRESSOR) 25 MG tablet Take 1 tablet (25 mg total) by mouth 2 (two) times daily. 08/15/17   Darlin Drop, DO  nitroGLYCERIN (NITROSTAT)  0.4 MG SL tablet Place 1 tablet (0.4 mg total) under the tongue every 5 (five) minutes as needed. Patient taking differently: Place 0.4 mg under the tongue every 5 (five) minutes as needed for chest pain.  11/17/16   Arty Baumgartner, NP  polyethylene glycol (MIRALAX / GLYCOLAX) packet Take 17 g by mouth daily as needed for mild constipation. 11/05/17   Laverna Peace, MD  zolpidem (AMBIEN) 10 MG tablet Take 10 mg by mouth at bedtime.     [provider]    Physical Exam: Vitals:   11/12/18 2037 11/12/18 2041 11/12/18 2045 11/12/18 2046  BP:  139/79 139/76   Pulse:  (!) 59 61   Resp:  20 (!) 23   Temp:  97.8 F (36.6 C)    TempSrc:  Oral    SpO2: 98%  98% 98%   Weight:    61.2 kg  Height:    5\' 2"  (1.575 m)      Constitutional: NAD, calm, comfortable Vitals:   11/12/18 2037 11/12/18 2041 11/12/18 2045 11/12/18 2046  BP:  139/79 139/76   Pulse:  (!) 59 61   Resp:  20 (!) 23   Temp:  97.8 F (36.6 C)    TempSrc:  Oral    SpO2: 98% 98% 98%   Weight:    61.2 kg  Height:    5\' 2"  (1.575 m)   Eyes: PERRL, lids and conjunctivae normal ENMT: Mucous membranes are moist. Posterior pharynx clear of any exudate or lesions.Normal dentition.  Neck: normal, supple, no masses, no thyromegaly Respiratory: clear to auscultation bilaterally, no wheezing, no crackles. Normal respiratory effort. No accessory muscle use.  Cardiovascular: Regular rate and rhythm, no murmurs / rubs / gallops. No extremity edema.0 pedal pulses. No carotid bruits.  Abdomen: no tenderness, no masses palpated. No hepatosplenomegaly. Bowel sounds positive.  Musculoskeletal: Right lower extremity markedly swollen from the foot all the way to the thigh, red with nonblanching, slightly warm, good ROM, no contractures. Normal muscle tone.  Skin: no rashes, lesions, ulcers. No induration Neurologic: CN 2-12 grossly intact. Sensation intact, DTR normal. Strength 5/5 in all 4.  Psychiatric: Confused, lack insight:  Disoriented    Labs on Admission: I have personally reviewed following labs and imaging studies  CBC: Recent Labs  Lab 11/12/18 2150  WBC 9.7  NEUTROABS 6.1  HGB 12.4  HCT 38.4  MCV 99.7  PLT 720   Basic Metabolic Panel: Recent Labs  Lab 11/12/18 2150  NA 139  K 4.0  CL 107  CO2 20*  GLUCOSE 118*  BUN 39*  CREATININE 1.77*  CALCIUM 9.0   GFR: Estimated Creatinine Clearance: 22.9 mL/min (A) (by C-G formula based on SCr of 1.77 mg/dL (H)). Liver Function Tests: Recent Labs  Lab 11/12/18 2150  AST 18  ALT 10  ALKPHOS 75  BILITOT 1.5*  PROT 7.0  ALBUMIN 3.6   No results for input(s): LIPASE, AMYLASE in the last 168 hours. No results for input(s): AMMONIA in the last 168 hours. Coagulation Profile: No results for input(s): INR, PROTIME in the last 168 hours. Cardiac Enzymes: No results for input(s): CKTOTAL, CKMB, CKMBINDEX, TROPONINI in the last 168 hours. BNP (last 3 results) No results for input(s): PROBNP in the last 8760 hours. HbA1C: No results for input(s): HGBA1C in the last 72 hours. CBG: No results for input(s): GLUCAP in the last 168 hours. Lipid Profile: No results for input(s): CHOL, HDL, LDLCALC, TRIG, CHOLHDL, LDLDIRECT in the last 72 hours. Thyroid Function Tests: No results for input(s): TSH, T4TOTAL, FREET4, T3FREE, THYROIDAB in the last 72 hours. Anemia Panel: No results for input(s): VITAMINB12, FOLATE, FERRITIN, TIBC, IRON, RETICCTPCT in the last 72 hours. Urine analysis:    Component Value Date/Time   COLORURINE AMBER (A) 03/27/2018 1519   APPEARANCEUR CLEAR 03/27/2018 1519   LABSPEC 1.024 03/27/2018 1519   PHURINE 6.0 03/27/2018 1519   GLUCOSEU NEGATIVE 03/27/2018 1519   HGBUR NEGATIVE 03/27/2018 1519   BILIRUBINUR NEGATIVE 03/27/2018 1519   KETONESUR 5 (A) 03/27/2018 1519   PROTEINUR 100 (A) 03/27/2018 1519   UROBILINOGEN 0.2 03/18/2015 1320   NITRITE POSITIVE (A) 03/27/2018 1519   LEUKOCYTESUR NEGATIVE 03/27/2018 1519    Sepsis Labs: @LABRCNTIP (procalcitonin:4,lacticidven:4) ) Recent Results (from the past 240 hour(s))  SARS Coronavirus 2 (CEPHEID - Performed in [place] [facility]  lab), Hosp Order     Status: None   Collection Time: 11/12/18 10:05 PM  Result Value Ref Range Status   SARS Coronavirus 2 NEGATIVE NEGATIVE Final    Comment: (NOTE) If result is NEGATIVE SARS-CoV-2 target nucleic acids are NOT DETECTED. The SARS-CoV-2 RNA is generally detectable in upper and lower  respiratory specimens during the acute phase of infection. The lowest  concentration of SARS-CoV-2 viral copies this assay can detect is 250  copies / mL. A negative result does not preclude SARS-CoV-2 infection  and should not be used as the sole basis for treatment or other  patient management decisions.  A negative result may occur with  improper specimen collection / handling, submission of specimen other  than nasopharyngeal swab, presence of viral mutation(s) within the  areas targeted by this assay, and inadequate number of viral copies  (<250 copies / mL). A negative result must be combined with clinical  observations, patient history, and epidemiological information. If result is POSITIVE SARS-CoV-2 target nucleic acids are DETECTED. The SARS-CoV-2 RNA is generally detectable in upper and lower  respiratory specimens dur ing the acute phase of infection.  Positive  results are indicative of active infection with SARS-CoV-2.  Clinical  correlation with patient history and other diagnostic information is  necessary to determine patient infection status.  Positive results do  not rule out bacterial infection or co-infection with other viruses. If result is PRESUMPTIVE POSTIVE SARS-CoV-2 nucleic acids MAY BE PRESENT.   A presumptive positive result was obtained on the submitted specimen  and confirmed on repeat testing.  While 2019 novel coronavirus  (SARS-CoV-2) nucleic acids may be present in the submitted sample   additional confirmatory testing may be necessary for epidemiological  and / or clinical management purposes  to differentiate between  SARS-CoV-2 and other Sarbecovirus currently known to infect humans.  If clinically indicated additional testing with an alternate test  methodology [phone]) is advised. The SARS-CoV-2 RNA is generally  detectable in upper and lower respiratory sp ecimens during the acute  phase of infection. The expected result is Negative. Fact Sheet for Patients:  BoilerBrush.com.cyhttps://www.fda.gov/media/136312/download Fact Sheet for Healthcare Providers: https://pope.com/https://www.fda.gov/media/136313/download This test is not yet approved or cleared by the [place] FDA and has been authorized for detection and/or diagnosis of SARS-CoV-2 by FDA under an Emergency Use Authorization (EUA).  This EUA will remain in effect (meaning this test can be used) for the duration of the COVID-19 declaration under Section 564(b)(1) of the Act, 21 U.S.C. section 360bbb-3(b)(1), unless the authorization is terminated or revoked sooner. Performed at [facility], [address] [place], [place] [address]      Radiological Exams on Admission: No results found.  Assessment/Plan Principal Problem:   Right leg DVT (HCC) Active Problems:   Aphasia   History of coronary artery stent placement (2009/2010)   Smoker   CAD (coronary artery disease)   History of DVT (deep vein thrombosis)   Essential hypertension   Hyperlipidemia   COPD (chronic obstructive pulmonary disease) (HCC)   ARF (acute renal failure) (HCC)     #1 right lower extremity swelling: DVT versus arterial blockade.  Initiate heparin drip.  Elevate foot.  Pain control.  Vascular surgery involved.  Doppler ultrasound in the morning and if negative arteriogram will be performed.  #2 hyperlipidemia: Continue with statin.  #3 acute kidney injury: Most likely prerenal.  Hydrate patient and monitor renal function in the  morning.  #4 essential hypertension: Blood pressure  appears well controlled.  Continue home regimen.  #5 aphasia: Residual from previous stroke.  Continue to monitor.  #6 coronary artery disease: No evidence of decompensation.   DVT prophylaxis: Heparin drip Code Status: DNR Family Communication: Family over the phone Disposition Plan: To be determined Consults called: Vascular surgery Admission status: Inpatient  Severity of Illness: The appropriate patient status for this patient is INPATIENT. Inpatient status is judged to be reasonable and necessary in order to provide the required intensity of service to ensure the patient's safety. The patient's presenting symptoms, physical exam findings, and initial radiographic and laboratory data in the context of their chronic comorbidities is felt to place them at high risk for further clinical deterioration. Furthermore, it is not anticipated that the patient will be medically stable for discharge from the hospital within 2 midnights of admission. The following factors support the patient status of inpatient.   " The patient's presenting symptoms include right lower extremity swelling and pain. " The worrisome physical exam findings include markedly swollen right lower extremity. " The initial radiographic and laboratory data are worrisome because of pending. " The chronic co-morbidities include previous history of DVT.   * I certify that at the point of admission it is my clinical judgment that the patient will require inpatient hospital care spanning beyond 2 midnights from the point of admission due to high intensity of service, high risk for further deterioration and high frequency of surveillance required.Lonia Blood*    Lakie Mclouth,LAWAL MD [facility] Pager [phone]  If 7PM-7AM, please contact night-coverage [email or web address] Password [email or web address]  11/12/2018, 11:50 PM

## 2018-11-12 NOTE — ED Triage Notes (Signed)
Per [facility], pt from home w/ a c/o right leg pain, swelling, and erythema that has been ongoing for a couple of days. Motor/sensory intact but unable to palpate distal pulses in right leg. PMS intact in left leg. Pt CAOx4 but slow to respond to questions.   160/80 HR 68 98% RA CBG 168 T. 97.9

## 2018-11-12 NOTE — ED Provider Notes (Signed)
[facility] Provider Note   CSN: [id number] Arrival date & time: 11/12/18  2036    History   Chief Complaint Chief Complaint  Patient presents with  . Leg Swelling  . Leg Pain    HPI Mackenzie Richards is a 78 y.o. female.     The history is provided by the patient and medical records.  Leg Pain  Location:  Leg Time since incident:  1 week Injury: no   Leg location:  R leg Pain details:    Quality:  Aching and dull   Radiates to:  Does not radiate   Severity:  Mild   Onset quality:  Gradual   Duration:  7 days   Timing:  Constant   Progression:  Unchanged Chronicity:  New Dislocation: no   Foreign body present:  No foreign bodies Tetanus status:  Unknown Prior injury to area:  No Relieved by:  Nothing Worsened by:  Activity, bearing weight, exercise, flexion and extension Ineffective treatments:  Rest, immobilization, elevation and compression Associated symptoms: stiffness and swelling   Associated symptoms: no back pain, no decreased ROM and no fever   Risk factors: no known bone disorder and no obesity     Past Medical History:  Diagnosis Date  . Alzheimer's dementia (HCC)   . Arthritis   . Asthma   . Bowen's disease   . Breast cyst   . Complication of anesthesia    i AM SLOW TO WAKE UP   . Coronary artery disease   . CVA (cerebral vascular accident) (HCC) 08/2017  . Diverticulitis   . DVT (deep venous thrombosis) (HCC)   . Frequent UTI   . Hypertension   . Kidney cysts   . Myocardial infarction [facility](HCC) 11/2016    Patient Active Problem List   Diagnosis Date Noted  . Right leg DVT (HCC) 11/12/2018  . ARF (acute renal failure) (HCC) 11/12/2018  . Unwitnessed fall 11/03/2017  . Closed comminuted intertrochanteric fracture of proximal end of right femur (HCC) 11/02/2017  . COPD (chronic obstructive pulmonary disease) (HCC) 11/02/2017  . History of stroke in prior 3 months 11/02/2017  . History of DVT (deep  vein thrombosis)   . Essential hypertension   . Hyperlipidemia   . Acute CVA (cerebrovascular accident) (HCC) 08/12/2017  . CAD (coronary artery disease) 08/12/2017  . Acute ST elevation myocardial infarction (STEMI) of inferior wall (HCC)   . Aborted myocardial infarction (HCC)   . 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 History of coronary artery stent placement (2009/2010) 03/18/2015  . Smoker 03/18/2015  . Aphasia     Past Surgical History:  Procedure Laterality Date  . BILATERAL KNEE ARTHROSCOPY    . CORONARY STENT INTERVENTION N/A 11/15/2016   Procedure: Coronary Stent Intervention;  Surgeon: Lyn RecordsSmith, Henry W, MD;  [facility];  Service: Cardiovascular;  Laterality: N/A;  Distal RCA  . EYE SURGERY    . FEMUR IM NAIL Right 11/03/2017   Procedure: INTRAMEDULLARY (IM) NAIL FEMORAL;  Surgeon: Roby LoftsHaddix, Kevin P, MD;  Location: [facility] OR;  Service: Orthopedics;  Laterality: Right;  . LEFT HEART CATH AND CORONARY ANGIOGRAPHY N/A 11/15/2016   Procedure: Left Heart Cath and Coronary Angiography;  Surgeon: Lyn RecordsSmith, Henry W, MD;  [facility];  Service: Cardiovascular;  Laterality: N/A;  . LOOP RECORDER INSERTION N/A 08/15/2017   Procedure: LOOP RECORDER INSERTION;  Surgeon: Duke SalviaKlein, Steven C, MD;  [facility];  Service: Cardiovascular;  Laterality: N/A;  . TEE WITHOUT CARDIOVERSION N/A 08/15/2017   Procedure: TRANSESOPHAGEAL ECHOCARDIOGRAM (TEE);  Surgeon: Lelon Perla, MD;  [facility];  Service: Cardiovascular;  Laterality: N/A;  . TUBAL LIGATION       OB History   No obstetric history on file.      Home Medications    Prior to Admission medications   Medication Sig Start Date End Date Taking? Authorizing Provider  acetaminophen (TYLENOL) 500 MG tablet Take 1,000 mg by mouth every 6 (six) hours as needed for headache (pain).     [provider]  aspirin 325 MG tablet Take 325 mg by mouth daily.    [provider]  atorvastatin (LIPITOR) 80 MG tablet Take 1 tablet (80 mg total) by mouth daily at 6 PM. 08/15/17   Kayleen Memos, DO  bisacodyl (DULCOLAX) 5 MG EC tablet Take 1 tablet (5 mg total) by mouth daily as needed for moderate constipation. Patient not taking: Reported on 03/27/2018 11/05/17   Oretha Milch D, MD  cephALEXin (KEFLEX) 500 MG capsule Take 1 capsule (500 mg total) by mouth 4 (four) times daily. 03/27/18   Lajean Saver, MD  cloNIDine (CATAPRES) 0.1 MG tablet Take 1 tablet (0.1 mg total) by mouth 2 (two) times daily. 08/15/17   Kayleen Memos, DO  clopidogrel (PLAVIX) 75 MG tablet Take 1 tablet (75 mg total) by mouth daily. 11/06/17   Desiree Hane, MD  methocarbamol (ROBAXIN) 500 MG tablet Take 1 tablet (500 mg total) by mouth every 6 (six) hours as needed for muscle spasms. Patient not taking: Reported on 03/27/2018 11/05/17   Oretha Milch D, MD  metoprolol tartrate (LOPRESSOR) 25 MG tablet Take 1 tablet (25 mg total) by mouth 2 (two) times daily. 08/15/17   Kayleen Memos, DO  nitroGLYCERIN (NITROSTAT) 0.4 MG SL tablet Place 1 tablet (0.4 mg total) under the tongue every 5 (five) minutes as needed. Patient taking differently: Place 0.4 mg under the tongue every 5 (five) minutes as needed for chest pain.  11/17/16   Cheryln Manly, NP  polyethylene glycol (MIRALAX / GLYCOLAX) packet Take 17 g by mouth daily as needed for mild constipation. 11/05/17   Desiree Hane, MD  zolpidem (AMBIEN) 10 MG tablet Take 10 mg by mouth at bedtime.     [provider]    Family History Family History  Problem Relation Age of Onset  . Hypertension Other     Social History Social History   Tobacco Use  . Smoking status: Current Every Day Smoker    Packs/day: 1.50    Years: 51.00    Pack years: 76.50    Types: Cigarettes  . Smokeless tobacco: Never Used  Substance Use Topics  . Alcohol use: No  . Drug use: No     Allergies   Fentanyl; Enalapril; Hydrochlorothiazide; Lasix  [furosemide]; Levaquin [levofloxacin in d5w]; Percocet [oxycodone-acetaminophen]; Sulfa antibiotics; Zyban [bupropion]; Biaxin [clarithromycin]; and Penicillins   Review of Systems Review of Systems  Constitutional: Negative for fever.  Musculoskeletal: Positive for stiffness. Negative for back pain.  All other systems reviewed and are negative.    Physical Exam Updated Vital Signs BP 138/66   Pulse (!) 57   Temp 97.8 F (36.6 C) (Oral)   Resp 20   Ht 5\' 2"  (1.575 m)   Wt 61.2 kg   SpO2 96%   BMI 24.69 kg/m   Physical Exam Vitals signs and nursing note reviewed.  Constitutional:  General: She is not in acute distress.    Appearance: She is well-developed.  HENT:     Head: Normocephalic and atraumatic.  Eyes:     Conjunctiva/sclera: Conjunctivae normal.  Neck:     Musculoskeletal: Neck supple.  Cardiovascular:     Rate and Rhythm: Normal rate.  Pulmonary:     Effort: Pulmonary effort is normal. No respiratory distress.  Abdominal:     Palpations: Abdomen is soft.     Tenderness: There is no abdominal tenderness.  Musculoskeletal:        General: Swelling present.     Right lower leg: Edema present.     Comments: Asymmetric swelling right lower extremity versus left lower extremity, right lower extremity is erythematous up to mid thigh, circumferential, warm to touch, compartments are soft, no pain with passive range of motion Motor strength and sensation right lower extremity intact, 2+ pulse with Doppler bilateral lower extremities  ABI right lower extremity 88/160  Skin:    General: Skin is warm and dry.  Neurological:     Mental Status: She is alert and oriented to person, place, and time.      ED Treatments / Results  Labs (all labs ordered are listed, but only abnormal results are displayed) Labs Reviewed  COMPREHENSIVE METABOLIC PANEL - Abnormal; Notable for the following components:      Result Value   CO2 20 (*)    Glucose, Bld 118 (*)     BUN 39 (*)    Creatinine, Ser 1.77 (*)    Total Bilirubin 1.5 (*)    GFR calc non Af Amer 27 (*)    GFR calc Af Amer 32 (*)    All other components within normal limits  BRAIN NATRIURETIC PEPTIDE - Abnormal; Notable for the following components:   B Natriuretic Peptide 132.4 (*)    All other components within normal limits  CBC WITH DIFFERENTIAL/PLATELET - Abnormal; Notable for the following components:   RBC 3.85 (*)    All other components within normal limits  SARS CORONAVIRUS 2 (HOSPITAL ORDER, PERFORMED IN [place] [facility])  LACTIC ACID, PLASMA  LACTIC ACID, PLASMA  HEPARIN LEVEL (UNFRACTIONATED)  CBC    EKG None  Radiology No results found.  Procedures Procedures (including critical care time)  Medications Ordered in ED Medications  heparin ADULT infusion 100 units/mL (25000 units/241mL sodium chloride 0.45%) (900 Units/hr Intravenous New Bag/Given 11/13/18 0020)  heparin bolus via infusion 3,000 Units (3,000 Units Intravenous Bolus from Bag 11/13/18 0022)     Initial Impression / Assessment and Plan / ED Course  I have reviewed the triage vital signs and the nursing notes.  Pertinent labs & imaging results that were available during my care of the patient were reviewed by me and considered in my medical decision making (see chart for details).        Medical Decision Making: RESHONDA DIOS is a 78 y.o. female who presented to the ED today with right lower extremity swelling Past medical history significant for asthma, hypertension, CVA, Alzheimer's disease, arthritis, CAD, DVTs, patient on Plavix, aspirin, not on any systemic anticoagulation per medication list or patient report Reviewed and confirmed nursing documentation for past medical history, family history, social history.  On my initial exam, the pt was calm, cooperative, conversant, follows commands appropriately, GCS 15, not tachycardic, not hypotensive, afebrile, no increased work of breathing  or respiratory distress, no signs of impending respiratory failure, physical exam revealed asymmetric swelling, erythematous skin  changes, mild tenderness, no obvious discomfort with passive range of motion, neurovascularly intact, 2+ pulse with Doppler, symptoms of swelling and skin changes have been worsening over the past week gradually.  Concern for arterial versus venous clot, mixed picture.  History of DVTs, not on systemic anticoagulation.  No obvious trauma to the leg.  Will obtain imaging for further evaluation and care, ABI right lower extremity 88/160. All radiology and laboratory studies reviewed independently and with my attending physician, agree with reading provided by radiologist unless otherwise noted.  Upon reassessing patient, patient was calm, resting comfortably.  AKI present, unable to obtain CTA, discussed with vascular surgery, cannot obtain ultrasound overnight, will admit to medicine for ultrasound in the morning, evaluation by vascular surgery.  Based on the above findings, I believe patient requires admission.  Patient admitted. The above care was discussed with and agreed upon by my attending physician. Emergency Department Medication Summary:  Medications  heparin ADULT infusion 100 units/mL (25000 units/25050mL sodium chloride 0.45%) (900 Units/hr Intravenous New Bag/Given 11/13/18 0020)  heparin bolus via infusion 3,000 Units (3,000 Units Intravenous Bolus from Bag 11/13/18 0022)    Final Clinical Impressions(s) / ED Diagnoses   Final diagnoses:  None    ED Discharge Orders    None       Erick Alleyasey, Handy Mcloud, MD 11/13/18 Durenda Age0025    Zavitz, Joshua, MD 11/13/18 [id number]

## 2018-11-12 NOTE — Progress Notes (Signed)
ANTICOAGULATION CONSULT NOTE - Initial Consult  Pharmacy Consult for Heparin  Indication: Rule out DVT vs arterial occlusion   Allergies  Allergen Reactions  . Fentanyl Other (See Comments)    "respiratory arrest"  . Enalapril Itching and Other (See Comments)    Pt states she gets hyper  . Hydrochlorothiazide Nausea And Vomiting  . Lasix [Furosemide] Other (See Comments)    Caused low blood pressure  . Levaquin [Levofloxacin In D5w] Nausea And Vomiting  . Percocet [Oxycodone-Acetaminophen] Nausea And Vomiting  . Sulfa Antibiotics Nausea And Vomiting  . Zyban [Bupropion] Other (See Comments)    Unknown reaction  . Biaxin [Clarithromycin] Rash  . Penicillins Rash    Has patient had a PCN reaction causing immediate rash, facial/tongue/throat swelling, SOB or lightheadedness with hypotension: Yes Has patient had a PCN reaction causing severe rash involving mucus membranes or skin necrosis: No Has patient had a PCN reaction that required hospitalization: No Has patient had a PCN reaction occurring within the last 10 years: yes If all of the above answers are "NO", then may proceed with Cephalosporin use.    Patient Measurements: Height: 5\' 2"  (157.5 cm) Weight: 135 lb (61.2 kg) IBW/kg (Calculated) : 50.1  Vital Signs: Temp: 97.8 F (36.6 C) (06/07 2041) Temp Source: Oral (06/07 2041) BP: 139/76 (06/07 2045) Pulse Rate: 61 (06/07 2045)  Labs: Recent Labs    11/12/18 2150  HGB 12.4  HCT 38.4  PLT 171  CREATININE 1.77*    Estimated Creatinine Clearance: 22.9 mL/min (A) (by C-G formula based on SCr of 1.77 mg/dL (H)).  Medical History: Past Medical History:  Diagnosis Date  . Alzheimer's dementia ([place])   . Arthritis   . Asthma   . Bowen's disease   . Breast cyst   . Complication of anesthesia    i AM SLOW TO WAKE UP   . Coronary artery disease   . CVA (cerebral vascular accident) ([place]) 08/2017  . Diverticulitis   . DVT (deep venous thrombosis) ([place])   .  Frequent UTI   . Hypertension   . Kidney cysts   . Myocardial infarction [facility]) 11/2016   Assessment: 78 y/o F presents to the ED with right leg pain/swelling. History of DVT but not currently on anti-coagulation. CBC good. Noted renal dysfunction.   Goal of Therapy:  Heparin level 0.3-0.7 units/ml Monitor platelets by anticoagulation protocol: Yes   Plan:  Heparin 3000 units BOLUS Start heparin drip at 900 units/hr 0900 HL Daily CBC/HL Monitor for bleeding  Narda Bonds, PharmD, BCPS Clinical Pharmacist Phone: [phone]

## 2018-11-12 NOTE — ED Notes (Signed)
ED TO INPATIENT HANDOFF REPORT  ED Nurse Name and Phone #:  Patty [phone]  S Name/Age/Gender Oneita Jolly 78 y.o. female Room/Bed: 017C/017C  Code Status   Code Status: Prior  Home/SNF/Other Home Patient oriented to: self, place, time and situation Is this baseline? Yes   Triage Complete: Triage complete  Chief Complaint rt leg swelling  Triage Note Per [facility], pt from home w/ a c/o right leg pain, swelling, and erythema that has been ongoing for a couple of days. Motor/sensory intact but unable to palpate distal pulses in right leg. PMS intact in left leg. Pt CAOx4 but slow to respond to questions.   160/80 HR 68 98% RA CBG 168 T. 97.9   Allergies Allergies  Allergen Reactions  . Fentanyl Other (See Comments)    "respiratory arrest"  . Enalapril Itching and Other (See Comments)    Pt states she gets hyper  . Hydrochlorothiazide Nausea And Vomiting  . Lasix [Furosemide] Other (See Comments)    Caused low blood pressure  . Levaquin [Levofloxacin In D5w] Nausea And Vomiting  . Percocet [Oxycodone-Acetaminophen] Nausea And Vomiting  . Sulfa Antibiotics Nausea And Vomiting  . Zyban [Bupropion] Other (See Comments)    Unknown reaction  . Biaxin [Clarithromycin] Rash  . Penicillins Rash    Has patient had a PCN reaction causing immediate rash, facial/tongue/throat swelling, SOB or lightheadedness with hypotension: Yes Has patient had a PCN reaction causing severe rash involving mucus membranes or skin necrosis: No Has patient had a PCN reaction that required hospitalization: No Has patient had a PCN reaction occurring within the last 10 years: yes If all of the above answers are "NO", then may proceed with Cephalosporin use.     Level of Care/Admitting Diagnosis ED Disposition    ED Disposition Condition Comment   Admit  Hospital Area: [facility] [[id number]]  Level of Care: Telemetry Medical [104]  Covid Evaluation: N/A  Diagnosis: Right leg  DVT [facility]) [[id number]]  Admitting Physician: Rometta Emery [[id number]]  Attending Physician: Rometta Emery [[id number]]  Estimated length of stay: past midnight tomorrow  Certification:: I certify this patient will need inpatient services for at least 2 midnights  PT Class (Do Not Modify): Inpatient [101]  PT Acc Code (Do Not Modify): Private [1]       B Medical/Surgery History Past Medical History:  Diagnosis Date  . Alzheimer's dementia (HCC)   . Arthritis   . Asthma   . Bowen's disease   . Breast cyst   . Complication of anesthesia    i AM SLOW TO WAKE UP   . Coronary artery disease   . CVA (cerebral vascular accident) (HCC) 08/2017  . Diverticulitis   . DVT (deep venous thrombosis) (HCC)   . Frequent UTI   . Hypertension   . Kidney cysts   . Myocardial infarction [facility]) 11/2016   Past Surgical History:  Procedure Laterality Date  . BILATERAL KNEE ARTHROSCOPY    . CORONARY STENT INTERVENTION N/A 11/15/2016   Procedure: Coronary Stent Intervention;  Surgeon: Lyn Records, MD;  Location: [facility] INVASIVE CV LAB;  Service: Cardiovascular;  Laterality: N/A;  Distal RCA  . EYE SURGERY    . FEMUR IM NAIL Right 11/03/2017   Procedure: INTRAMEDULLARY (IM) NAIL FEMORAL;  Surgeon: Roby Lofts, MD;  Location: [facility] OR;  Service: Orthopedics;  Laterality: Right;  . LEFT HEART CATH AND CORONARY ANGIOGRAPHY N/A 11/15/2016   Procedure: Left Heart Cath and Coronary Angiography;  Surgeon: Lyn RecordsSmith, Henry W, MD;  [facility];  Service: Cardiovascular;  Laterality: N/A;  . LOOP RECORDER INSERTION N/A 08/15/2017   Procedure: LOOP RECORDER INSERTION;  Surgeon: Duke SalviaKlein, Steven C, MD;  [facility];  Service: Cardiovascular;  Laterality: N/A;  . TEE WITHOUT CARDIOVERSION N/A 08/15/2017   Procedure: TRANSESOPHAGEAL ECHOCARDIOGRAM (TEE);  Surgeon: Lewayne Buntingrenshaw, Brian S, MD;  [facility];  Service: Cardiovascular;  Laterality: N/A;  . TUBAL LIGATION       A IV  Location/Drains/Wounds Patient Lines/Drains/Airways Status   Active Line/Drains/Airways    Name:   Placement date:   Placement time:   Site:   Days:   Peripheral IV 11/12/18 Left Forearm   11/12/18    2037    Forearm   less than 1   Incision (Closed) 11/03/17 Hip Right   11/03/17    1010     374          Intake/Output Last 24 hours No intake or output data in the 24 hours ending 11/12/18 2342  Labs/Imaging Results for orders placed or performed during the hospital encounter of 11/12/18 (from the past 48 hour(s))  Comprehensive metabolic panel     Status: Abnormal   Collection Time: 11/12/18  9:50 PM  Result Value Ref Range   Sodium 139 135 - 145 mmol/L   Potassium 4.0 3.5 - 5.1 mmol/L    Comment: SPECIMEN HEMOLYZED. HEMOLYSIS MAY AFFECT INTEGRITY OF RESULTS.   Chloride 107 98 - 111 mmol/L   CO2 20 (L) 22 - 32 mmol/L   Glucose, Bld 118 (H) 70 - 99 mg/dL   BUN 39 (H) 8 - 23 mg/dL   Creatinine, Ser 1.611.77 (H) 0.44 - 1.00 mg/dL   Calcium 9.0 8.9 - 09.610.3 mg/dL   Total Protein 7.0 6.5 - 8.1 g/dL   Albumin 3.6 3.5 - 5.0 g/dL   AST 18 15 - 41 U/L   ALT 10 0 - 44 U/L   Alkaline Phosphatase 75 38 - 126 U/L   Total Bilirubin 1.5 (H) 0.3 - 1.2 mg/dL   GFR calc non Af Amer 27 (L) >60 mL/min   GFR calc Af Amer 32 (L) >60 mL/min   Anion gap 12 5 - 15    Comment: Performed at [facility], [address] [place], [place] [address]  Brain natriuretic peptide     Status: Abnormal   Collection Time: 11/12/18  9:50 PM  Result Value Ref Range   B Natriuretic Peptide 132.4 (H) 0.0 - 100.0 pg/mL    Comment: Performed at [facility], [address] [place], [place] [address]  CBC with Differential     Status: Abnormal   Collection Time: 11/12/18  9:50 PM  Result Value Ref Range   WBC 9.7 4.0 - 10.5 K/uL   RBC 3.85 (L) 3.87 - 5.11 MIL/uL   Hemoglobin 12.4 12.0 - 15.0 g/dL   HCT 14.738.4 82.936.0 - 56.246.0 %   MCV 99.7 80.0 - 100.0 fL   MCH 32.2 26.0 - 34.0 pg   MCHC 32.3 30.0 - 36.0 g/dL    RDW 13.013.8 86.511.5 - 78.415.5 %   Platelets 171 150 - 400 K/uL   nRBC 0.0 0.0 - 0.2 %   Neutrophils Relative % 63 %   Neutro Abs 6.1 1.7 - 7.7 K/uL   Lymphocytes Relative 26 %   Lymphs Abs 2.5 0.7 - 4.0 K/uL   Monocytes Relative 7 %  Monocytes Absolute 0.7 0.1 - 1.0 K/uL   Eosinophils Relative 3 %   Eosinophils Absolute 0.2 0.0 - 0.5 K/uL   Basophils Relative 1 %   Basophils Absolute 0.1 0.0 - 0.1 K/uL   Immature Granulocytes 0 %   Abs Immature Granulocytes 0.04 0.00 - 0.07 K/uL    Comment: Performed at [facility], [address] [place], [place] [address]  Lactic acid, plasma     Status: None   Collection Time: 11/12/18  9:50 PM  Result Value Ref Range   Lactic Acid, Venous 1.0 0.5 - 1.9 mmol/L    Comment: Performed at [facility], [address] [place], [place] [address]  SARS Coronavirus 2 (CEPHEID - Performed in [facility] hospital lab), Hosp Order     Status: None   Collection Time: 11/12/18 10:05 PM  Result Value Ref Range   SARS Coronavirus 2 NEGATIVE NEGATIVE    Comment: (NOTE) If result is NEGATIVE SARS-CoV-2 target nucleic acids are NOT DETECTED. The SARS-CoV-2 RNA is generally detectable in upper and lower  respiratory specimens during the acute phase of infection. The lowest  concentration of SARS-CoV-2 viral copies this assay can detect is 250  copies / mL. A negative result does not preclude SARS-CoV-2 infection  and should not be used as the sole basis for treatment or other  patient management decisions.  A negative result may occur with  improper specimen collection / handling, submission of specimen other  than nasopharyngeal swab, presence of viral mutation(s) within the  areas targeted by this assay, and inadequate number of viral copies  (<250 copies / mL). A negative result must be combined with clinical  observations, patient history, and epidemiological information. If result is POSITIVE SARS-CoV-2 target nucleic acids are DETECTED. The  SARS-CoV-2 RNA is generally detectable in upper and lower  respiratory specimens dur ing the acute phase of infection.  Positive  results are indicative of active infection with SARS-CoV-2.  Clinical  correlation with patient history and other diagnostic information is  necessary to determine patient infection status.  Positive results do  not rule out bacterial infection or co-infection with other viruses. If result is PRESUMPTIVE POSTIVE SARS-CoV-2 nucleic acids MAY BE PRESENT.   A presumptive positive result was obtained on the submitted specimen  and confirmed on repeat testing.  While 2019 novel coronavirus  (SARS-CoV-2) nucleic acids may be present in the submitted sample  additional confirmatory testing may be necessary for epidemiological  and / or clinical management purposes  to differentiate between  SARS-CoV-2 and other Sarbecovirus currently known to infect humans.  If clinically indicated additional testing with an alternate test  methodology [phone]([id number]) is advised. The SARS-CoV-2 RNA is generally  detectable in upper and lower respiratory sp ecimens during the acute  phase of infection. The expected result is Negative. Fact Sheet for Patients:  BoilerBrush.com.cyhttps://www.fda.gov/media/136312/download Fact Sheet for Healthcare Providers: https://pope.com/https://www.fda.gov/media/136313/download This test is not yet approved or cleared by the [place] FDA and has been authorized for detection and/or diagnosis of SARS-CoV-2 by FDA under an Emergency Use Authorization (EUA).  This EUA will remain in effect (meaning this test can be used) for the duration of the COVID-19 declaration under Section 564(b)(1) of the Act, 21 U.S.C. section 360bbb-3(b)(1), unless the authorization is terminated or revoked sooner. Performed at [facility], [address] [place], [place] [address]    No results found.  Pending Labs [employer] Labs (From admission, onward)  Start     Ordered   11/12/18 2139   Lactic acid, plasma  Now then every 2 hours,   STAT     11/12/18 2139          Vitals/Pain Today's Vitals   11/12/18 2041 11/12/18 2045 11/12/18 2046 11/12/18 2152  BP: 139/79 139/76    Pulse: (!) 59 61    Resp: 20 (!) 23    Temp: 97.8 F (36.6 C)     TempSrc: Oral     SpO2: 98% 98%    Weight:   61.2 kg   Height:   5\' 2"  (1.575 m)   PainSc:   0-No pain Asleep    Isolation Precautions No active isolations  Medications Medications - No data to display  Mobility walks Low fall risk   Focused Assessments    R Recommendations: See Admitting Provider Note  Report given to:   Additional Notes:

## 2018-11-13 ENCOUNTER — Inpatient Hospital Stay (HOSPITAL_COMMUNITY): Payer: Medicare HMO

## 2018-11-13 ENCOUNTER — Encounter (HOSPITAL_COMMUNITY): Payer: Medicare HMO

## 2018-11-13 DIAGNOSIS — Z955 Presence of coronary angioplasty implant and graft: Secondary | ICD-10-CM

## 2018-11-13 DIAGNOSIS — E785 Hyperlipidemia, unspecified: Secondary | ICD-10-CM

## 2018-11-13 DIAGNOSIS — R2241 Localized swelling, mass and lump, right lower limb: Secondary | ICD-10-CM

## 2018-11-13 DIAGNOSIS — M7989 Other specified soft tissue disorders: Secondary | ICD-10-CM

## 2018-11-13 DIAGNOSIS — Z86718 Personal history of other venous thrombosis and embolism: Secondary | ICD-10-CM

## 2018-11-13 DIAGNOSIS — M79604 Pain in right leg: Secondary | ICD-10-CM

## 2018-11-13 LAB — CBC
HCT: 38.8 % (ref 36.0–46.0)
HCT: 41.8 % (ref 36.0–46.0)
Hemoglobin: 12.6 g/dL (ref 12.0–15.0)
Hemoglobin: 13.9 g/dL (ref 12.0–15.0)
MCH: 32.2 pg (ref 26.0–34.0)
MCH: 32.3 pg (ref 26.0–34.0)
MCHC: 32.5 g/dL (ref 30.0–36.0)
MCHC: 33.3 g/dL (ref 30.0–36.0)
MCV: 99.2 fL (ref 80.0–100.0)
MCV: 97.2 fL (ref 80.0–100.0)
Platelets: 177 10*3/uL (ref 150–400)
Platelets: 183 10*3/uL (ref 150–400)
RBC: 3.91 MIL/uL (ref 3.87–5.11)
RBC: 4.3 MIL/uL (ref 3.87–5.11)
RDW: 13.7 % (ref 11.5–15.5)
RDW: 13.8 % (ref 11.5–15.5)
WBC: 9.6 10*3/uL (ref 4.0–10.5)
WBC: 11.1 10*3/uL — ABNORMAL HIGH (ref 4.0–10.5)
nRBC: 0 % (ref 0.0–0.2)
nRBC: 0 % (ref 0.0–0.2)

## 2018-11-13 LAB — COMPREHENSIVE METABOLIC PANEL
ALT: 10 U/L (ref 0–44)
AST: 16 U/L (ref 15–41)
Albumin: 3.5 g/dL (ref 3.5–5.0)
Alkaline Phosphatase: 84 U/L (ref 38–126)
Anion gap: 11 (ref 5–15)
BUN: 36 mg/dL — ABNORMAL HIGH (ref 8–23)
CO2: 19 mmol/L — ABNORMAL LOW (ref 22–32)
Calcium: 8.8 mg/dL — ABNORMAL LOW (ref 8.9–10.3)
Chloride: 109 mmol/L (ref 98–111)
Creatinine, Ser: 1.72 mg/dL — ABNORMAL HIGH (ref 0.44–1.00)
GFR calc Af Amer: 33 mL/min — ABNORMAL LOW (ref >60)
GFR calc non Af Amer: 28 mL/min — ABNORMAL LOW (ref >60)
Glucose, Bld: 121 mg/dL — ABNORMAL HIGH (ref 70–99)
Potassium: 3.4 mmol/L — ABNORMAL LOW (ref 3.5–5.1)
Sodium: 139 mmol/L (ref 135–145)
Total Bilirubin: 1 mg/dL (ref 0.3–1.2)
Total Protein: 7 g/dL (ref 6.5–8.1)

## 2018-11-13 LAB — TSH: TSH: 1.127 u[IU]/mL (ref 0.350–4.500)

## 2018-11-13 LAB — LACTIC ACID, PLASMA: Lactic Acid, Venous: 1.1 mmol/L (ref 0.5–1.9)

## 2018-11-13 LAB — HEPARIN LEVEL (UNFRACTIONATED): Heparin Unfractionated: 0.81 IU/mL — ABNORMAL HIGH (ref 0.30–0.70)

## 2018-11-13 MED ORDER — ONDANSETRON HCL 4 MG/2ML IJ SOLN: 4.0000 mg | Freq: Four times a day (QID) | INTRAMUSCULAR | Status: DC | PRN

## 2018-11-13 MED ORDER — SODIUM CHLORIDE 0.9 % IV SOLN
INTRAVENOUS | Status: DC
  Administered 2018-11-13: 01:00:00 via INTRAVENOUS

## 2018-11-13 MED ORDER — ASPIRIN EC 81 MG PO TBEC: 81.0000 mg | DELAYED_RELEASE_TABLET | Freq: Every day | ORAL | 3 refills | Status: AC

## 2018-11-13 MED ORDER — ELIQUIS 5 MG VTE STARTER PACK: ORAL_TABLET | ORAL | 3 refills | Status: AC

## 2018-11-13 MED ORDER — MORPHINE SULFATE (PF) 2 MG/ML IV SOLN: 2.0000 mg | INTRAVENOUS | Status: DC | PRN

## 2018-11-13 MED ORDER — ONDANSETRON HCL 4 MG PO TABS: 4.0000 mg | ORAL_TABLET | Freq: Four times a day (QID) | ORAL | Status: DC | PRN

## 2018-11-13 MED ORDER — HYDRALAZINE HCL 20 MG/ML IJ SOLN
10.0000 mg | Freq: Four times a day (QID) | INTRAMUSCULAR | Status: DC | PRN
  Administered 2018-11-13: 10 mg via INTRAVENOUS
  Filled 2018-11-13: qty 1

## 2018-11-13 MED ORDER — POTASSIUM CHLORIDE 10 MEQ/100ML IV SOLN
10.0000 meq | INTRAVENOUS | Status: AC
  Administered 2018-11-13 (×2): 10 meq via INTRAVENOUS
  Filled 2018-11-13 (×2): qty 100

## 2018-11-13 MED ORDER — HYDRALAZINE HCL 20 MG/ML IJ SOLN
10.0000 mg | Freq: Once | INTRAMUSCULAR | Status: AC
  Administered 2018-11-13: 10 mg via INTRAVENOUS
  Filled 2018-11-13: qty 1

## 2018-11-13 NOTE — Progress Notes (Signed)
ANTICOAGULATION CONSULT NOTE - Follow-Up Consult  Pharmacy Consult for Heparin  Indication: Rule out DVT vs arterial occlusion   Allergies  Allergen Reactions  . Fentanyl Other (See Comments)    "respiratory arrest"  . Enalapril Itching and Other (See Comments)    Pt states she gets hyper  . Hydrochlorothiazide Nausea And Vomiting  . Lasix [Furosemide] Other (See Comments)    Caused low blood pressure  . Levaquin [Levofloxacin In D5w] Nausea And Vomiting  . Percocet [Oxycodone-Acetaminophen] Nausea And Vomiting  . Sulfa Antibiotics Nausea And Vomiting  . Zyban [Bupropion] Other (See Comments)    Unknown reaction  . Biaxin [Clarithromycin] Rash  . Penicillins Rash    Has patient had a PCN reaction causing immediate rash, facial/tongue/throat swelling, SOB or lightheadedness with hypotension: Yes Has patient had a PCN reaction causing severe rash involving mucus membranes or skin necrosis: No Has patient had a PCN reaction that required hospitalization: No Has patient had a PCN reaction occurring within the last 10 years: yes If all of the above answers are "NO", then may proceed with Cephalosporin use.    Patient Measurements: Height: 5\' 2"  (157.5 cm) Weight: 133 lb 12.8 oz (60.7 kg) IBW/kg (Calculated) : 50.1  Vital Signs: Temp: 98 F (36.7 C) (06/08 1123) Temp Source: Oral (06/08 1123) BP: 156/77 (06/08 1123) Pulse Rate: 87 (06/08 1123)  Labs: Recent Labs    11/12/18 2150 11/13/18 0227 11/13/18 1107  HGB 12.4 12.6 13.9  HCT 38.4 38.8 41.8  PLT 171 177 183  HEPARINUNFRC  --   --  0.81*  CREATININE 1.77* 1.72*  --     Estimated Creatinine Clearance: 23.5 mL/min (A) (by C-G formula based on SCr of 1.72 mg/dL (H)).  Medical History: Past Medical History:  Diagnosis Date  . Alzheimer's dementia ([place])   . Arthritis   . Asthma   . Bowen's disease   . Breast cyst   . Complication of anesthesia    i AM SLOW TO WAKE UP   . Coronary artery disease   . CVA  (cerebral vascular accident) ([place]) 08/2017  . Diverticulitis   . DVT (deep venous thrombosis) ([place])   . Frequent UTI   . Hypertension   . Kidney cysts   . Myocardial infarction [facility]) 11/2016   Assessment: 78 y/o F presents to the ED with right leg pain/swelling. History of DVT but not currently on anti-coagulation. CBC stable, initial heparin level slightly above goal at 0.81. Preliminary dopplers consistent with R acute DVT. No S/Sx bleeding noted by RN.  Goal of Therapy:  Heparin level 0.3-0.7 units/ml Monitor platelets by anticoagulation protocol: Yes   Plan:  -Reduce heparin to 750 units/h -Recheck heparin level in [place], PharmD, BCPS Clinical Pharmacist [phone] Please check AMION for all [facility] Pharmacy numbers 11/13/2018

## 2018-11-13 NOTE — Progress Notes (Addendum)
Right lower extremity venous duplex completed. Unable to get into [facility] at this time Called report given to Dr. Donzetta Matters And Dr. Louanne Belton. [facility], RVS  11/13/2018, 11:00 AM  Preliminary results now in Chart review CV Proc. [place] Lucile Hillmann,RVS 11/13/2018 11:10 AM

## 2018-11-13 NOTE — Discharge Summary (Signed)
Physician Discharge Summary  Mackenzie JollyWilma S Richards ZOX:[id number] DOB: 09-06-1940 DOA: 11/12/2018  PCP: Mackenzie PandaMillsaps, Kimberly, NP  Admit date: 11/12/2018 Discharge date: 11/13/2018  Admitted From: Home  Discharge disposition: home   Recommendations for Outpatient Follow-Up:   Follow up with your primary care provider in one week.  Please obtain CBC BMP within 1 week.  Discharge Diagnosis:   Principal Problem:   Right leg DVT (HCC) Active Problems:   Aphasia   History of coronary artery stent placement (2009/2010)   Smoker   CAD (coronary artery disease)   History of DVT (deep vein thrombosis)   Essential hypertension   Hyperlipidemia   COPD (chronic obstructive pulmonary disease) (HCC)   ARF (acute renal failure) (HCC)    Discharge Condition: Improved.  Diet recommendation: Low sodium, heart healthy.    Wound care: None.  Code status: DNR   History of Present Illness:    Mackenzie Richards is a 78 y.o. 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  Patient has had previous DVT but is a poor historian.  Is not clear as to when the swelling initially started.  Patient had markedly swollen right foot all the way to the thigh.   In the ED, her vitals were within normal range.  White count was 9.7.  Electrolytes grossly within normal limits except for creatinine of 1.7 and BUN of 39.  Lactic acid was 1.0.  Patient was then admitted to hospital.  Ultrasound of the lower extremities showed extensive DVT.  Patient was also seen by vascular surgery.    Hospital Course:  Patient was admitted to hospital and following conditions were addressed during hospitalization,  Right lower extremity swelling:  Secondary to extensive right lower extremity DVT.  And will likely need lifelong anticoagulation.  Patient  was initiated on heparin drip on admission.  I had a prolonged discussion with the patient's husband on the phone regarding anticoagulation choices along with risk versus benefits of anticoagulation.  At this time, the plan is to proceed with Eliquis.  Patient is mostly homebound and her husband takes care of a lot of things for her.  I have explained the risk of bleeding.  At this time, dose of aspirin has been decreased to 81 mg a day.  Patient had stent almost 3 years back so Plavix will be discontinued, since the patient will need anticoagulation.  hyperlipidemia: Continue  statin.  Mild acute kidney injury: Most likely prerenal.  Patient was encouraged to drink more fluids.  Patient will need to follow-up with her primary care physician within 1 week to check blood work.   essential hypertension: Blood pressure appears well controlled.  Continue home regimen.   aphasia: Residual from previous stroke.    History of coronary artery disease: No evidence of decompensation.  Disposition.  At this time, patient is stable for disposition home.  Will need to follow-up with primary care physician within 1 week to check CBC BMP.    Medical Consultants:    Vascular surgery   Subjective:   Today, patient feels okay.  Poor historian.  Denies any chest pain, palpitation.  Discharge Exam:   Vitals:   11/13/18 0546 11/13/18 1123  BP: (!) 185/97 (!) 156/77  Pulse: 72 87  Resp: 20 18  Temp: (!) 97.5 F (36.4 C) 98 F (36.7 C)  SpO2: 98%  100%   Vitals:   11/13/18 0104 11/13/18 0151 11/13/18 0546 11/13/18 1123  BP: (!) 173/81 (!) 180/66 (!) 185/97 (!) 156/77  Pulse: 62 (!) 52 72 87  Resp: 18  20 18   Temp: 98.1 F (36.7 C)  (!) 97.5 F (36.4 C) 98 F (36.7 C)  TempSrc: Oral  Oral Oral  SpO2: 96%  98% 100%  Weight: 60.7 kg     Height: 5\' 2"  (1.575 m)       General exam: Appears calm and comfortable ,Not in distress HEENT:PERRL,Oral mucosa moist Respiratory system: Bilateral  equal air entry, normal vesicular breath sounds, no wheezes or crackles  Cardiovascular system: S1 & S2 heard, RRR.  Gastrointestinal system: Abdomen is nondistended, soft and nontender. No organomegaly or masses felt. Normal bowel sounds heard. Central nervous system: Alert awake and communicative. Extremities: Right lower extremity  swollen from the foot all the way to the thigh, red nonblanching erythema with increased warmth. Skin: No rashes, lesions or ulcers,no icterus ,no pallor MSK: Normal muscle bulk,tone ,power    Procedures:    None  The results of significant diagnostics from this hospitalization (including imaging, microbiology, ancillary and laboratory) are listed below for reference.     Diagnostic Studies:   Vas [place] Lower Extremity Venous (dvt)  Result Date: 11/13/2018  Lower Venous Study Indications: Swelling, and Erythema.  Risk Factors: DVT Remote h/O DVT No DVT noted in 08/15/2017. Performing Technologist: Mackenzie Richards RVS  Examination Guidelines: A complete evaluation includes B-mode imaging, spectral Doppler, color Doppler, and power Doppler as needed of all accessible portions of each vessel. Bilateral testing is considered an integral part of a complete examination. Limited examinations for reoccurring indications may be performed as noted.  +---------+---------------+---------+-----------+----------+-------------------+ RIGHT    CompressibilityPhasicitySpontaneityPropertiesSummary             +---------+---------------+---------+-----------+----------+-------------------+ CFV      None           No       No                                       +---------+---------------+---------+-----------+----------+-------------------+ SFJ      None                                                             +---------+---------------+---------+-----------+----------+-------------------+ FV Prox  None           No       No                                        +---------+---------------+---------+-----------+----------+-------------------+ FV Mid   None                                                             +---------+---------------+---------+-----------+----------+-------------------+ FV DistalNone           No       No                                       +---------+---------------+---------+-----------+----------+-------------------+  PFV      None           No       No                                       +---------+---------------+---------+-----------+----------+-------------------+ POP      None           No       No                                       +---------+---------------+---------+-----------+----------+-------------------+ PTV      None                                                             +---------+---------------+---------+-----------+----------+-------------------+ PERO                                                  Unable to evaluate                                                        due to drawing of                                                         the leg when                                                              touched             +---------+---------------+---------+-----------+----------+-------------------+ Unable to evaluate the EIV due to movement. No Color flow noted.  +----+---------------+---------+-----------+----------+-------+ LEFTCompressibilityPhasicitySpontaneityPropertiesSummary +----+---------------+---------+-----------+----------+-------+ CFV Full           Yes      Yes                          +----+---------------+---------+-----------+----------+-------+ SFJ Full                                                 +----+---------------+---------+-----------+----------+-------+     Summary: Right: Findings consistent with acute deep vein thrombosis involving the right common femoral vein, right femoral  vein, right proximal profunda vein, right popliteal vein, and right posterior tibial vein. No cystic structure found in the popliteal fossa. Left: There is  no evidence of a common femoral vein obstruction.  *See table(s) above for measurements and observations.    Preliminary      Labs:   Basic Metabolic Panel: Recent Labs  Lab 11/12/18 2150 11/13/18 0227  NA 139 139  K 4.0 3.4*  CL 107 109  CO2 20* 19*  GLUCOSE 118* 121*  BUN 39* 36*  CREATININE 1.77* 1.72*  CALCIUM 9.0 8.8*   GFR Estimated Creatinine Clearance: 23.5 mL/min (A) (by C-G formula based on SCr of 1.72 mg/dL (H)). Liver Function Tests: Recent Labs  Lab 11/12/18 2150 11/13/18 0227  AST 18 16  ALT 10 10  ALKPHOS 75 84  BILITOT 1.5* 1.0  PROT 7.0 7.0  ALBUMIN 3.6 3.5   No results for input(s): LIPASE, AMYLASE in the last 168 hours. No results for input(s): AMMONIA in the last 168 hours. Coagulation profile No results for input(s): INR, PROTIME in the last 168 hours.  CBC: Recent Labs  Lab 11/12/18 2150 11/13/18 0227 11/13/18 1107  WBC 9.7 9.6 11.1*  NEUTROABS 6.1  --   --   HGB 12.4 12.6 13.9  HCT 38.4 38.8 41.8  MCV 99.7 99.2 97.2  PLT 171 177 183   Cardiac Enzymes: No results for input(s): CKTOTAL, CKMB, CKMBINDEX, TROPONINI in the last 168 hours. BNP: Invalid input(s): POCBNP CBG: No results for input(s): GLUCAP in the last 168 hours. D-Dimer No results for input(s): DDIMER in the last 72 hours. Hgb A1c No results for input(s): HGBA1C in the last 72 hours. Lipid Profile No results for input(s): CHOL, HDL, LDLCALC, TRIG, CHOLHDL, LDLDIRECT in the last 72 hours. Thyroid function studies Recent Labs    11/13/18 0227  TSH 1.127   Anemia work up No results for input(s): VITAMINB12, FOLATE, FERRITIN, TIBC, IRON, RETICCTPCT in the last 72 hours. Microbiology Recent Results (from the past 240 hour(s))  SARS Coronavirus 2 (CEPHEID - Performed in [facility] hospital lab), Hosp Order      Status: None   Collection Time: 11/12/18 10:05 PM  Result Value Ref Range Status   SARS Coronavirus 2 NEGATIVE NEGATIVE Final    Comment: (NOTE) If result is NEGATIVE SARS-CoV-2 target nucleic acids are NOT DETECTED. The SARS-CoV-2 RNA is generally detectable in upper and lower  respiratory specimens during the acute phase of infection. The lowest  concentration of SARS-CoV-2 viral copies this assay can detect is 250  copies / mL. A negative result does not preclude SARS-CoV-2 infection  and should not be used as the sole basis for treatment or other  patient management decisions.  A negative result may occur with  improper specimen collection / handling, submission of specimen other  than nasopharyngeal swab, presence of viral mutation(s) within the  areas targeted by this assay, and inadequate number of viral copies  (<250 copies / mL). A negative result must be combined with clinical  observations, patient history, and epidemiological information. If result is POSITIVE SARS-CoV-2 target nucleic acids are DETECTED. The SARS-CoV-2 RNA is generally detectable in upper and lower  respiratory specimens dur ing the acute phase of infection.  Positive  results are indicative of active infection with SARS-CoV-2.  Clinical  correlation with patient history and other diagnostic information is  necessary to determine patient infection status.  Positive results do  not rule out bacterial infection or co-infection with other viruses. If result is PRESUMPTIVE POSTIVE SARS-CoV-2 nucleic acids MAY BE PRESENT.   A presumptive positive result was obtained on the submitted specimen  and  confirmed on repeat testing.  While 2019 novel coronavirus  (SARS-CoV-2) nucleic acids may be present in the submitted sample  additional confirmatory testing may be necessary for epidemiological  and / or clinical management purposes  to differentiate between  SARS-CoV-2 and other Sarbecovirus currently known to  infect humans.  If clinically indicated additional testing with an alternate test  methodology [phone]([id number]) is advised. The SARS-CoV-2 RNA is generally  detectable in upper and lower respiratory sp ecimens during the acute  phase of infection. The expected result is Negative. Fact Sheet for Patients:  BoilerBrush.com.cyhttps://www.fda.gov/media/136312/download Fact Sheet for Healthcare Providers: https://pope.com/https://www.fda.gov/media/136313/download This test is not yet approved or cleared by the [place] FDA and has been authorized for detection and/or diagnosis of SARS-CoV-2 by FDA under an Emergency Use Authorization (EUA).  This EUA will remain in effect (meaning this test can be used) for the duration of the COVID-19 declaration under Section 564(b)(1) of the Act, 21 U.S.C. section 360bbb-3(b)(1), unless the authorization is terminated or revoked sooner. Performed at [facility], [address] [place], [place] [address]      Discharge Instructions:   Discharge Instructions    Diet - low sodium heart healthy   Complete by:  As directed    Discharge instructions   Complete by:  As directed    You have been pescribed blood thinners.  Please take precautions on blood thinners.  If you notice any bleeding, please seek medical attention and stop  blood thinners.  Dose of aspirin has been decreased.  You have been taken off the Plavix.  Please follow-up with your primary care physician to continue prescription for blood thinners.   Increase activity slowly   Complete by:  As directed      Allergies as of 11/13/2018      Reactions   Fentanyl Other (See Comments)   "respiratory arrest"   Enalapril Itching, Other (See Comments)   Pt states she gets hyper   Hydrochlorothiazide Nausea And Vomiting   Lasix [furosemide] Other (See Comments)   Caused low blood pressure   Levaquin [levofloxacin In D5w] Nausea And Vomiting   Percocet [oxycodone-acetaminophen] Nausea And Vomiting   Sulfa Antibiotics  Nausea And Vomiting   Zyban [bupropion] Other (See Comments)   Unknown reaction   Biaxin [clarithromycin] Rash   Penicillins Rash   Has patient had a PCN reaction causing immediate rash, facial/tongue/throat swelling, SOB or lightheadedness with hypotension: Yes Has patient had a PCN reaction causing severe rash involving mucus membranes or skin necrosis: No Has patient had a PCN reaction that required hospitalization: No Has patient had a PCN reaction occurring within the last 10 years: yes If all of the above answers are "NO", then may proceed with Cephalosporin use.      Medication List    STOP taking these medications   aspirin 325 MG tablet Replaced by:  aspirin EC 81 MG tablet   clopidogrel 75 MG tablet Commonly known as:  Plavix     TAKE these medications   acetaminophen 500 MG tablet Commonly known as:  TYLENOL Take 1,000 mg by mouth every 6 (six) hours as needed for headache (pain).   aspirin EC 81 MG tablet Take 1 tablet (81 mg total) by mouth daily. Replaces:  aspirin 325 MG tablet   atorvastatin 80 MG tablet Commonly known as:  LIPITOR Take 1 tablet (80 mg total) by mouth daily at 6 PM.   bisacodyl 5 MG EC tablet Commonly known as:  DULCOLAX Take  1 tablet (5 mg total) by mouth daily as needed for moderate constipation.   cephALEXin 500 MG capsule Commonly known as:  Keflex Take 1 capsule (500 mg total) by mouth 4 (four) times daily.   cloNIDine 0.1 MG tablet Commonly known as:  CATAPRES Take 1 tablet (0.1 mg total) by mouth 2 (two) times daily.   Eliquis DVT/PE Starter Pack 5 MG Tabs Take as directed on package: start with two-5mg  tablets twice daily for 7 days. On day 8, switch to one-5mg  tablet twice daily.   methocarbamol 500 MG tablet Commonly known as:  ROBAXIN Take 1 tablet (500 mg total) by mouth every 6 (six) hours as needed for muscle spasms.   metoprolol tartrate 25 MG tablet Commonly known as:  LOPRESSOR Take 1 tablet (25 mg total) by  mouth 2 (two) times daily.   nitroGLYCERIN 0.4 MG SL tablet Commonly known as:  Nitrostat Place 1 tablet (0.4 mg total) under the tongue every 5 (five) minutes as needed. What changed:  reasons to take this   polyethylene glycol 17 g packet Commonly known as:  MIRALAX / GLYCOLAX Take 17 g by mouth daily as needed for mild constipation.   zolpidem 10 MG tablet Commonly known as:  AMBIEN Take 10 mg by mouth at bedtime.         Time coordinating discharge: 39 minutes  Signed:  Rhythm Gubbels  [facility] 11/13/2018, 1:47 PM

## 2018-11-13 NOTE — Consult Note (Addendum)
Hospital Consult    Reason for Consult:  Possible dvt Referring Physician:  Dr. Tyson BabinskiPokhrel MRN #:  [id number]  History of Present Illness: This is a 78 y.o. female with a history of DVT as well as Alzheimer's.  She has hypertension as a risk factor for arterial disease.  She is a limited historian.  She cannot tell me exactly when her leg began to swell but says it is tender from the knee down.  She has been walking according to her.  Does not currently take blood thinners previously did for DVT.  Most information obtained through the chart.  Past Medical History:  Diagnosis Date  . Alzheimer's dementia (HCC)   . Arthritis   . Asthma   . Bowen's disease   . Breast cyst   . Complication of anesthesia    i AM SLOW TO WAKE UP   . Coronary artery disease   . CVA (cerebral vascular accident) (HCC) 08/2017  . Diverticulitis   . DVT (deep venous thrombosis) (HCC)   . Frequent UTI   . Hypertension   . Kidney cysts   . Myocardial infarction [facility](HCC) 11/2016    Past Surgical History:  Procedure Laterality Date  . BILATERAL KNEE ARTHROSCOPY    . CORONARY STENT INTERVENTION N/A 11/15/2016   Procedure: Coronary Stent Intervention;  Surgeon: Lyn RecordsSmith, Henry W, MD;  [facility];  Service: Cardiovascular;  Laterality: N/A;  Distal RCA  . EYE SURGERY    . FEMUR IM NAIL Right 11/03/2017   Procedure: INTRAMEDULLARY (IM) NAIL FEMORAL;  Surgeon: Roby LoftsHaddix, Kevin P, MD;  Location: [facility] OR;  Service: Orthopedics;  Laterality: Right;  . LEFT HEART CATH AND CORONARY ANGIOGRAPHY N/A 11/15/2016   Procedure: Left Heart Cath and Coronary Angiography;  Surgeon: Lyn RecordsSmith, Henry W, MD;  [facility];  Service: Cardiovascular;  Laterality: N/A;  . LOOP RECORDER INSERTION N/A 08/15/2017   Procedure: LOOP RECORDER INSERTION;  Surgeon: Duke SalviaKlein, Steven C, MD;  [facility];  Service: Cardiovascular;  Laterality: N/A;  . TEE WITHOUT CARDIOVERSION N/A 08/15/2017   Procedure:  TRANSESOPHAGEAL ECHOCARDIOGRAM (TEE);  Surgeon: Lewayne Buntingrenshaw, Brian S, MD;  [facility];  Service: Cardiovascular;  Laterality: N/A;  . TUBAL LIGATION      Allergies  Allergen Reactions  . Fentanyl Other (See Comments)    "respiratory arrest"  . Enalapril Itching and Other (See Comments)    Pt states she gets hyper  . Hydrochlorothiazide Nausea And Vomiting  . Lasix [Furosemide] Other (See Comments)    Caused low blood pressure  . Levaquin [Levofloxacin In D5w] Nausea And Vomiting  . Percocet [Oxycodone-Acetaminophen] Nausea And Vomiting  . Sulfa Antibiotics Nausea And Vomiting  . Zyban [Bupropion] Other (See Comments)    Unknown reaction  . Biaxin [Clarithromycin] Rash  . Penicillins Rash    Has patient had a PCN reaction causing immediate rash, facial/tongue/throat swelling, SOB or lightheadedness with hypotension: Yes Has patient had a PCN reaction causing severe rash involving mucus membranes or skin necrosis: No Has patient had a PCN reaction that required hospitalization: No Has patient had a PCN reaction occurring within the last 10 years: yes If all of the above answers are "NO", then may proceed with Cephalosporin use.     Prior to Admission medications   Medication Sig Start Date End Date Taking? Authorizing Provider  acetaminophen (TYLENOL) 500 MG tablet Take 1,000 mg by mouth every 6 (six) hours as needed for headache (pain).  [provider]  aspirin 325 MG tablet Take 325 mg by mouth daily.    [provider]  atorvastatin (LIPITOR) 80 MG tablet Take 1 tablet (80 mg total) by mouth daily at 6 PM. 08/15/17   Kayleen Memos, DO  bisacodyl (DULCOLAX) 5 MG EC tablet Take 1 tablet (5 mg total) by mouth daily as needed for moderate constipation. Patient not taking: Reported on 03/27/2018 11/05/17   Oretha Milch D, MD  cephALEXin (KEFLEX) 500 MG capsule Take 1 capsule (500 mg total) by mouth 4 (four) times daily. 03/27/18   Lajean Saver, MD   cloNIDine (CATAPRES) 0.1 MG tablet Take 1 tablet (0.1 mg total) by mouth 2 (two) times daily. 08/15/17   Kayleen Memos, DO  clopidogrel (PLAVIX) 75 MG tablet Take 1 tablet (75 mg total) by mouth daily. 11/06/17   Desiree Hane, MD  methocarbamol (ROBAXIN) 500 MG tablet Take 1 tablet (500 mg total) by mouth every 6 (six) hours as needed for muscle spasms. Patient not taking: Reported on 03/27/2018 11/05/17   Oretha Milch D, MD  metoprolol tartrate (LOPRESSOR) 25 MG tablet Take 1 tablet (25 mg total) by mouth 2 (two) times daily. 08/15/17   Kayleen Memos, DO  nitroGLYCERIN (NITROSTAT) 0.4 MG SL tablet Place 1 tablet (0.4 mg total) under the tongue every 5 (five) minutes as needed. Patient taking differently: Place 0.4 mg under the tongue every 5 (five) minutes as needed for chest pain.  11/17/16   Cheryln Manly, NP  polyethylene glycol (MIRALAX / GLYCOLAX) packet Take 17 g by mouth daily as needed for mild constipation. 11/05/17   Desiree Hane, MD  zolpidem (AMBIEN) 10 MG tablet Take 10 mg by mouth at bedtime.     [provider]    Social History   Socioeconomic History  . Marital status: Married    Spouse name: Not on file  . Number of children: Not on file  . Years of education: Not on file  . Highest education level: Not on file  Occupational History  . Occupation: retired  Scientific laboratory technician  . Financial resource strain: Not on file  . Food insecurity:    Worry: Not on file    Inability: Not on file  . Transportation needs:    Medical: Not on file    Non-medical: Not on file  Tobacco Use  . Smoking status: Current Every Day Smoker    Packs/day: 1.50    Years: 51.00    Pack years: 76.50    Types: Cigarettes  . Smokeless tobacco: Never Used  Substance and Sexual Activity  . Alcohol use: No  . Drug use: No  . Sexual activity: Not on file  Lifestyle  . Physical activity:    Days per week: Not on file    Minutes per session: Not on file  . Stress: Not on file   Relationships  . Social connections:    Talks on phone: Not on file    Gets together: Not on file    Attends religious service: Not on file    Active member of club or organization: Not on file    Attends meetings of clubs or organizations: Not on file    Relationship status: Not on file  . Intimate partner violence:    Fear of current or ex partner: Not on file    Emotionally abused: Not on file    Physically abused: Not on file    Forced sexual activity:  Not on file  Other Topics Concern  . Not on file  Social History Narrative  . Not on file    Family History  Problem Relation Age of Onset  . Hypertension Other     ROS:  Cardiovascular: []  chest pain/pressure []  palpitations []  SOB lying flat []  DOE []  pain in legs while walking []  pain in legs at rest []  pain in legs at night []  non-healing ulcers [x]  hx of DVT [x]  swelling in legs  Pulmonary: []  productive cough []  asthma/wheezing []  home O2  Neurologic: []  weakness in []  arms []  legs []  numbness in []  arms []  legs []  hx of CVA []  mini stroke [] difficulty speaking or slurred speech []  temporary loss of vision in one eye []  dizziness  Hematologic: []  hx of cancer []  bleeding problems []  problems with blood clotting easily  Endocrine:   []  diabetes []  thyroid disease  GI []  vomiting blood []  blood in stool  GU: []  CKD/renal failure []  HD--[]  M/W/F or []  T/T/S []  burning with urination []  blood in urine  Psychiatric: []  anxiety []  depression  Musculoskeletal: []  arthritis []  joint pain  Integumentary: []  rashes []  ulcers  Constitutional: []  fever []  chills   Physical Examination  Vitals:   11/13/18 0151 11/13/18 0546  BP: (!) 180/66 (!) 185/97  Pulse: (!) 52 72  Resp:  20  Temp:  (!) 97.5 F (36.4 C)  SpO2:  98%   Body mass index is 24.47 kg/m.  General: She is not in any acute distress HENT: WNL, normocephalic Pulmonary: normal non-labored breathing Cardiac:  Palpable popliteal pedal pulses bilaterally Abdomen: soft, NT/ND, no masses Extremities: Right lower extremity is edematous from the thigh down.  There is purple-red discoloration but all compartments are soft Brisk capillary refill bilateral feet Neurologic: She is awake and alert.  Both feet are sensorimotor intact   CBC    Component Value Date/Time   WBC 9.6 11/13/2018 0227   RBC 3.91 11/13/2018 0227   HGB 12.6 11/13/2018 0227   HCT 38.8 11/13/2018 0227   PLT 177 11/13/2018 0227   MCV 99.2 11/13/2018 0227   MCH 32.2 11/13/2018 0227   MCHC 32.5 11/13/2018 0227   RDW 13.7 11/13/2018 0227   LYMPHSABS 2.5 11/12/2018 2150   MONOABS 0.7 11/12/2018 2150   EOSABS 0.2 11/12/2018 2150   BASOSABS 0.1 11/12/2018 2150    BMET    Component Value Date/Time   NA 139 11/13/2018 0227   K 3.4 (L) 11/13/2018 0227   CL 109 11/13/2018 0227   CO2 19 (L) 11/13/2018 0227   GLUCOSE 121 (H) 11/13/2018 0227   BUN 36 (H) 11/13/2018 0227   CREATININE 1.72 (H) 11/13/2018 0227   CALCIUM 8.8 (L) 11/13/2018 0227   GFRNONAA 28 (L) 11/13/2018 0227   GFRAA 33 (L) 11/13/2018 0227    COAGS: Lab Results  Component Value Date   INR 0.91 01/08/2018   INR 1.38 11/15/2016   INR 0.94 03/18/2015     Non-Invasive Vascular Imaging:   Right lower extremity DVT study ordered  ASSESSMENT/PLAN: This is a 78 y.o. female presents with unknown duration right lower extremity pain and swelling.  Palpable dorsalis pedis pulse and brisk capillary refill.  History of DVT.  In the setting I would favor either recurrent DVT versus low-grade trauma of which patient gives no history.  She has a palpable dorsalis pedis pulse I do not think this is an arterial issue.  We will follow-up on DVT study treatment  most likely will be anticoagulation.  Barrie Wale C. Randie Heinz, MD [facility] Office: [phone] Pager: [phone]  Addendum: I reviewed the duplex ultrasound of the right lower extremity  which demonstrates extensive right lower extremity DVT.  Given patient's multiple comorbid conditions she is not a candidate for lytic therapy or venous intervention.  Will need anticoagulation.  No need for vascular follow-up.   Lemar Livings, MD

## 2019-07-22 IMAGING — CR DG LUMBAR SPINE COMPLETE 4+V
5 series · 5 of 5 positions shown · non-contrast
Comparison: CT 03/27/2018

CLINICAL DATA: Back pain after fall

EXAM:
LUMBAR SPINE - COMPLETE 4+ VIEW

[l-spine ap]
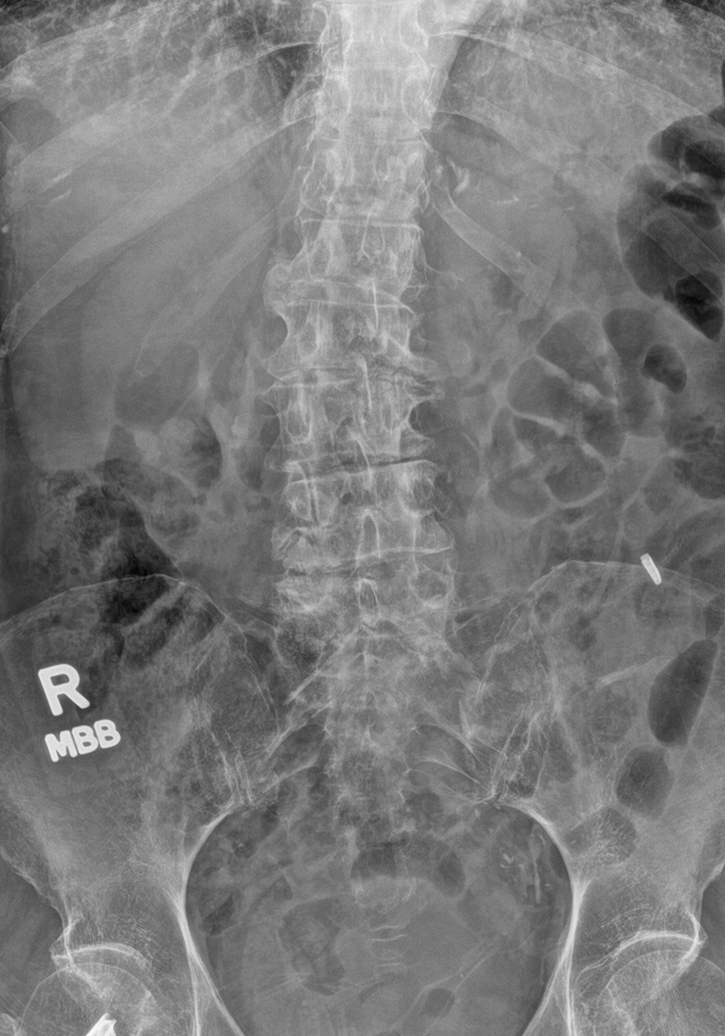

[l-spine obl (1 of 2)]
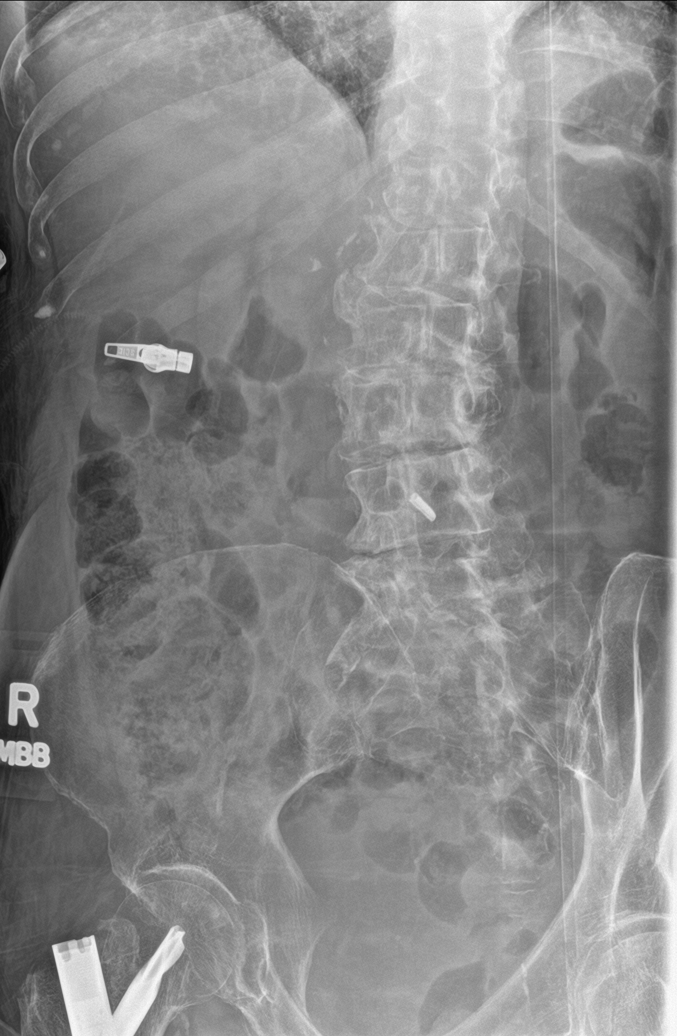

[l-spine obl (2 of 2)]
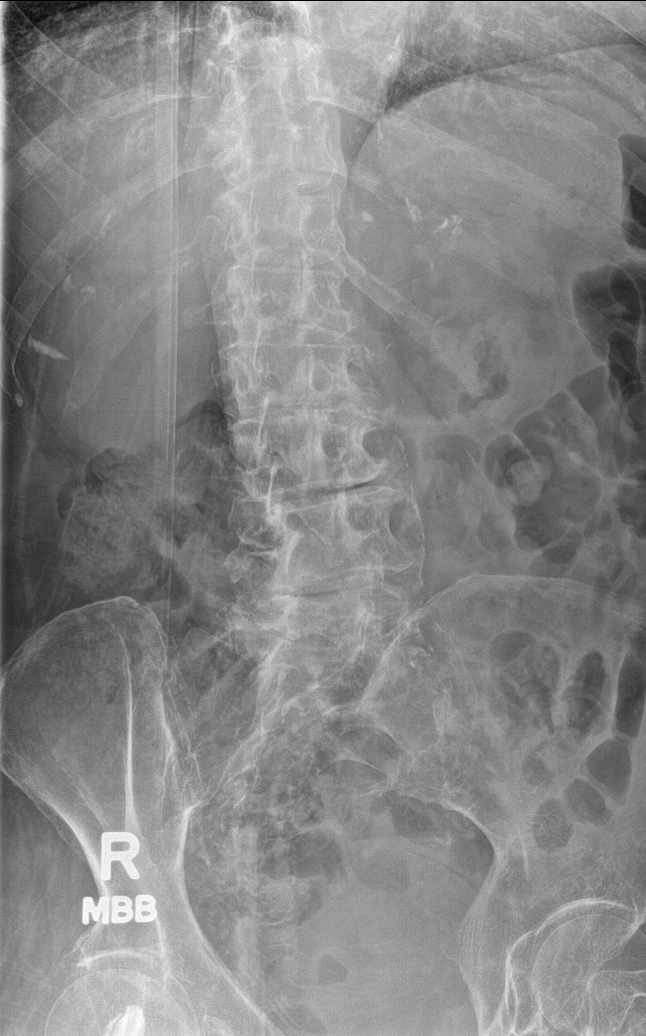

[l-spine lat]
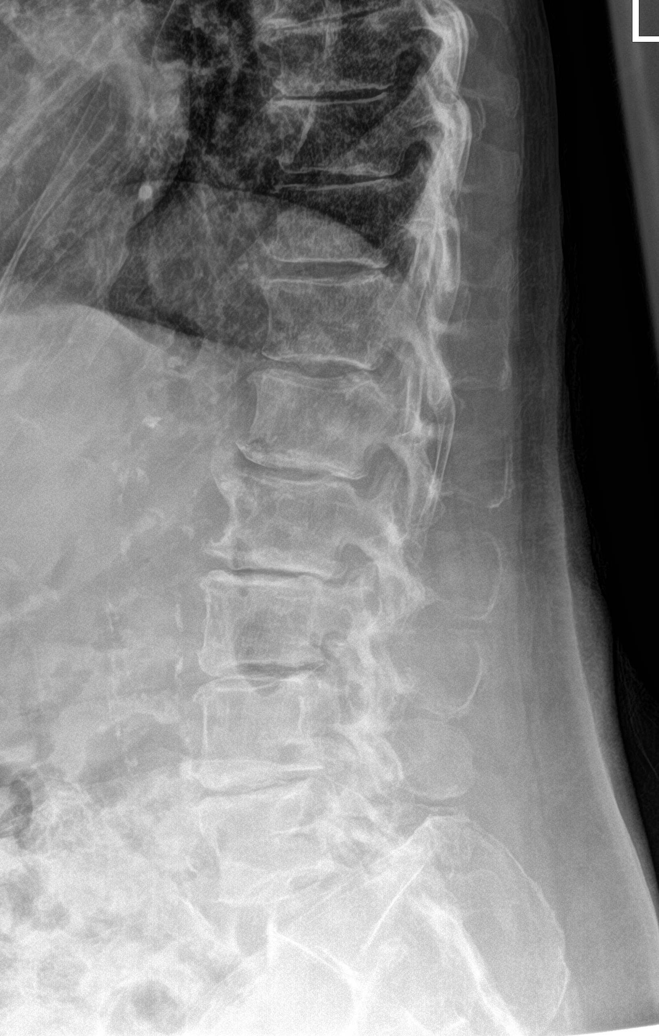

[l-spine spot]
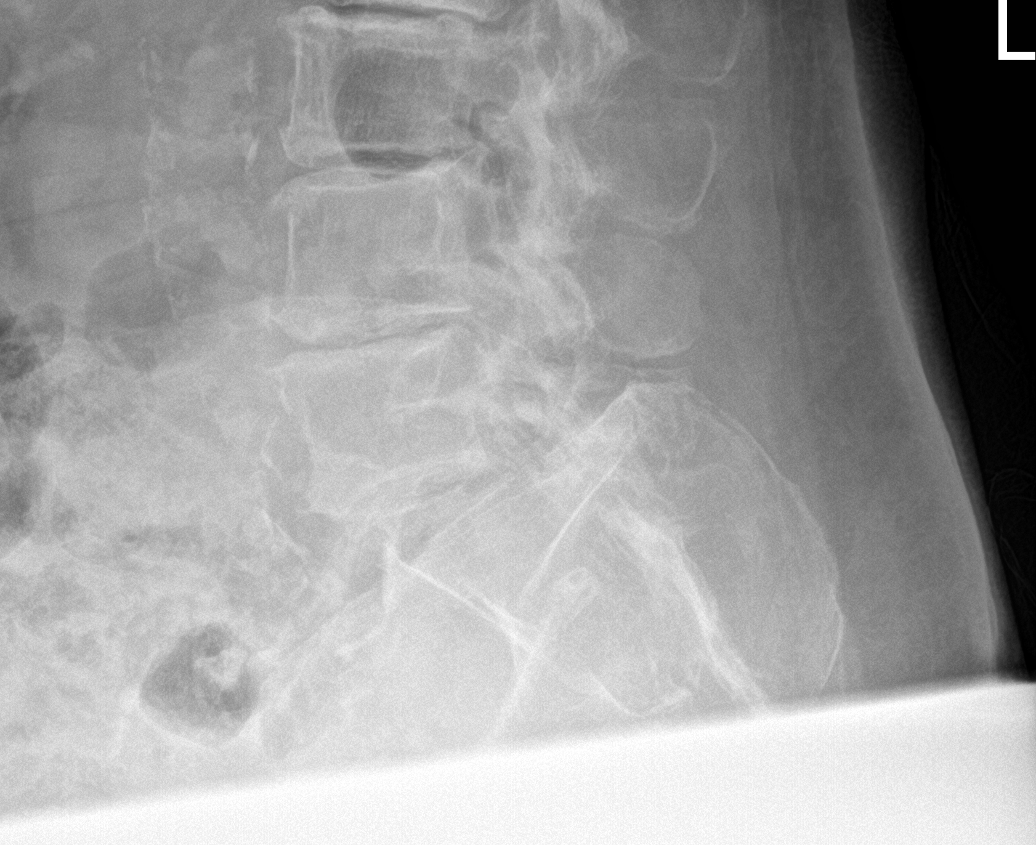

[5 of 5 positions shown; findings below may reference images not displayed]

FINDINGS: There are 5 non ribbed lumbar type vertebral bodies. Grade 1
bordering grade 2 anterolisthesis of L5 on S1 by 13 mm is suggested
the lateral projection. Moderate-to-marked disc flattening is
identified along the course of the lumbar spine L1 through S1. No
pars defects or listhesis. Degenerative facet arthropathy seen L2
through S. are demineralized. Lower thoracic spondylosis is also
noted with degenerative disc space narrowing. No pelvic diastasis.
Aortic atherosclerosis is noted without definite aneurysm.
IMPRESSION: 1. Lumbar spondylosis with moderate-to-marked disc flattening along
the course of the lumbar spine L1 through S1.
2. Grade 1 bordering grade 2 anterolisthesis of L5 on S1 by 13 mm.
This appears more accentuated than on the prior CT.
3. No acute osseous abnormality.

## 2019-07-22 IMAGING — CR DG HIP (WITH OR WITHOUT PELVIS) 3-4V BILAT
5 series · 5 of 5 positions shown · non-contrast
Comparison: None.

CLINICAL DATA: Patient fell at home landing on the bottom and has
bilateral hip and lower back pain.

EXAM:
DG HIP (WITH OR WITHOUT PELVIS) 3-4V BILAT

[hip ap (1 of 2)]
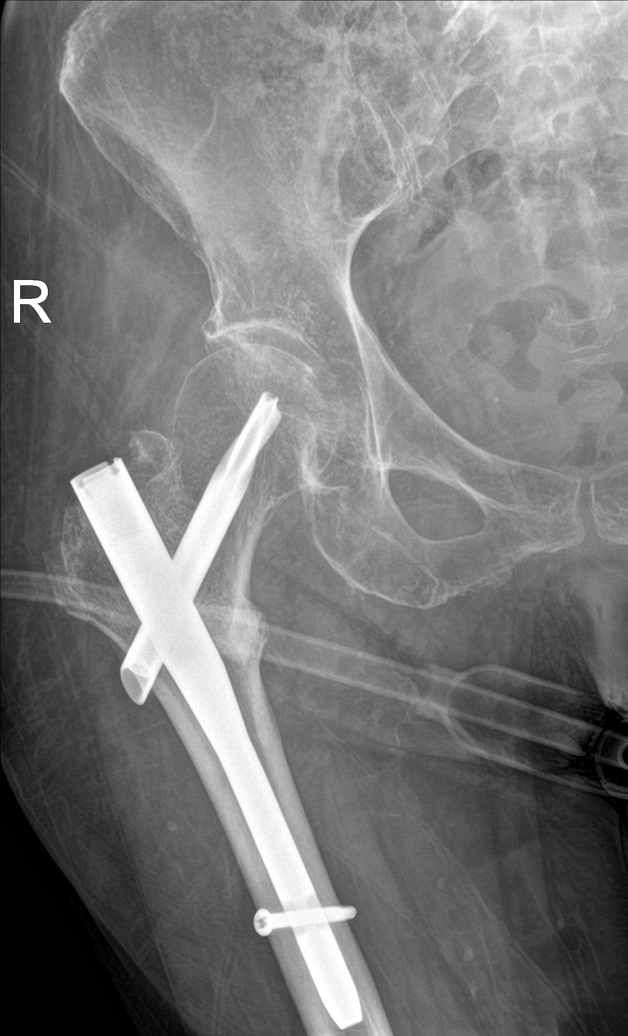

[hip lat (1 of 2)]
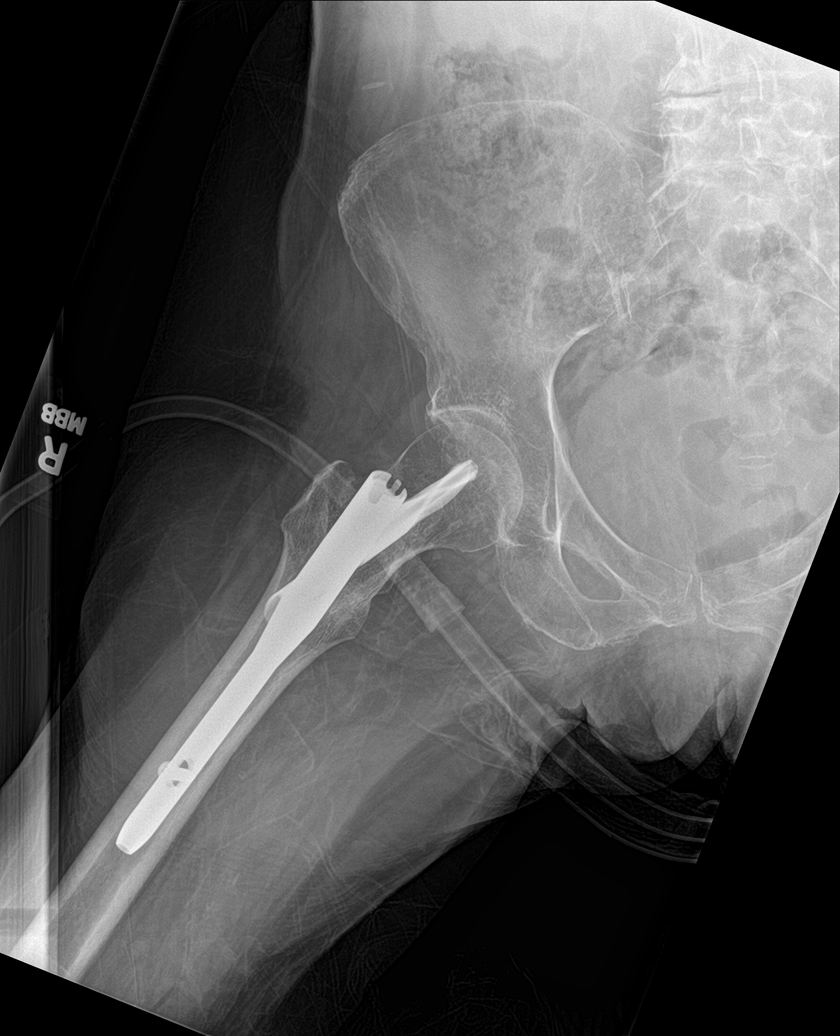

[hip ap (2 of 2)]
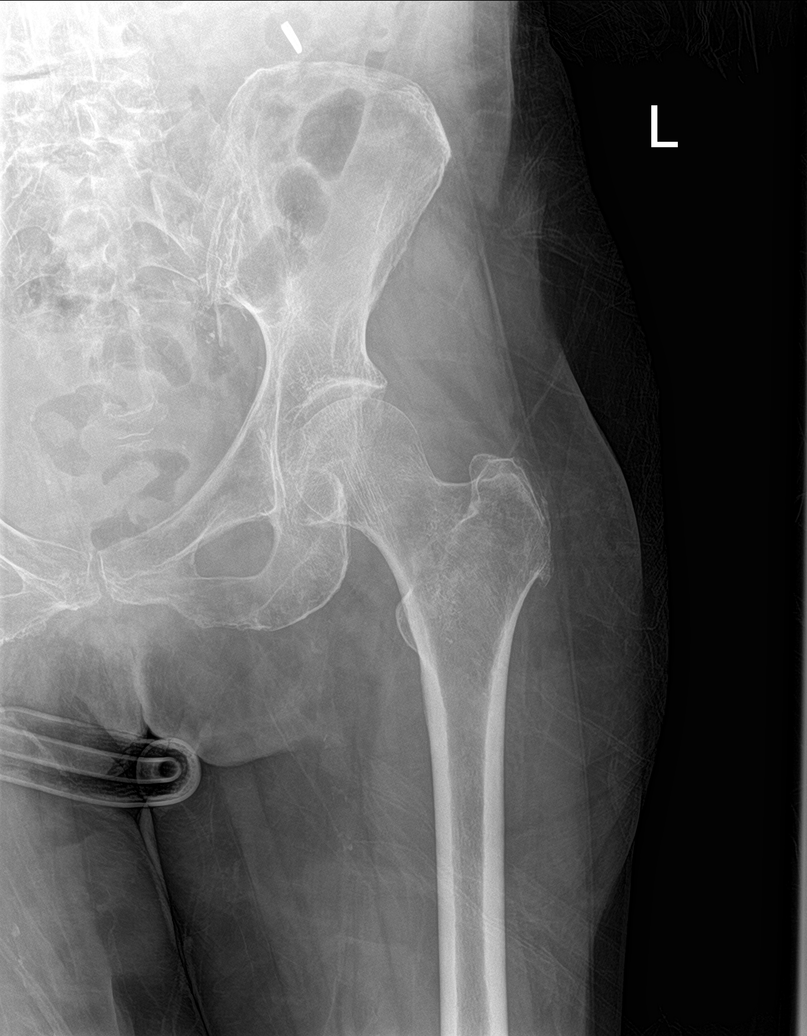

[hip lat (2 of 2)]
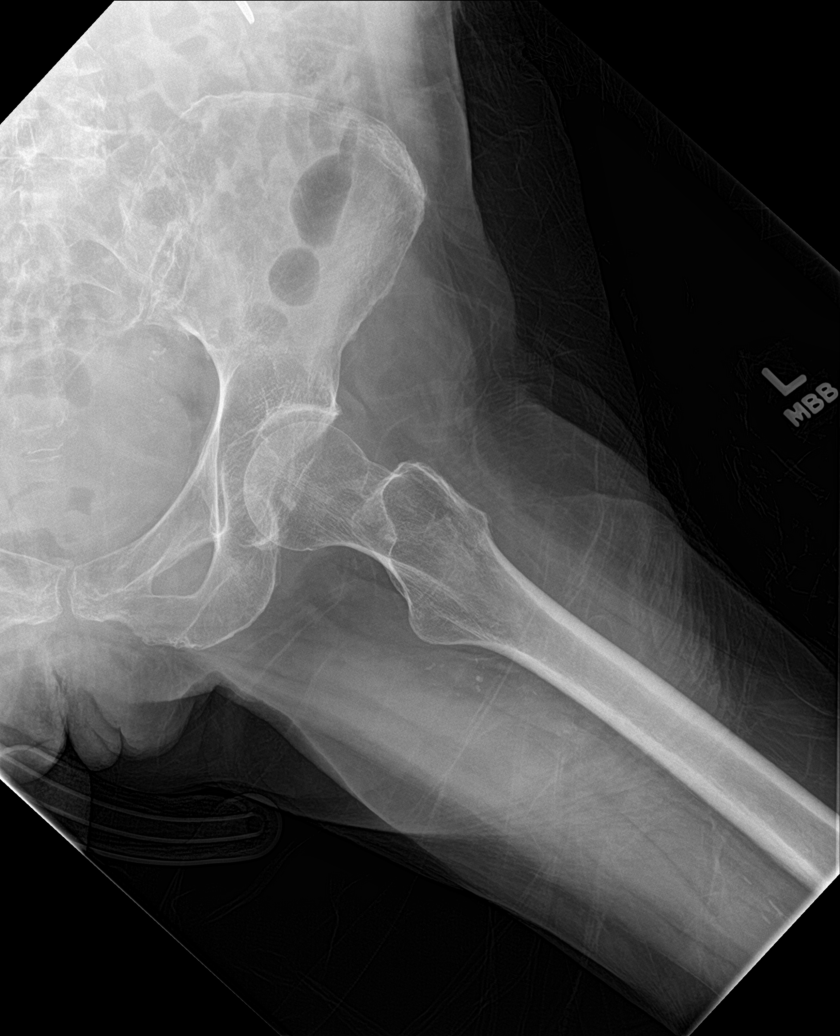

[pelvis ap]
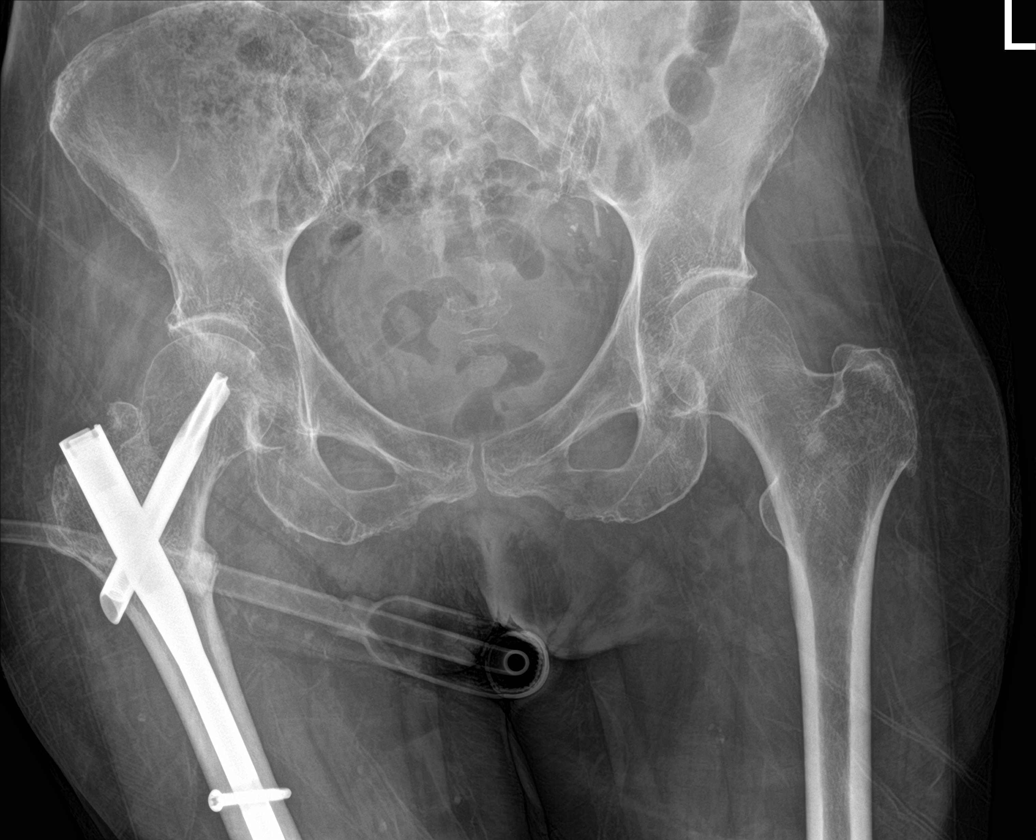

[5 of 5 positions shown; findings below may reference images not displayed]

FINDINGS: There is degenerative disc disease and facet arthropathy of the
included lumbar spine from L4 through S1. No pelvic diastasis is
noted. Hip joints are maintained bilaterally. No acute fracture of
either hip. Intact femoral nail fixation of the right femur. Urinary
catheter device is identified.
IMPRESSION: 1. No acute osseous abnormality of the pelvis and either hip.
2. Lower lumbar degenerative disc and facet arthropathy.
